# Patient Record
Sex: Male | Born: 1940 | Race: White | Hispanic: No | Marital: Married | Smoking: Former smoker
Health system: Southern US, Community
[De-identification: ages and names within clinical notes are randomized; demographics above are authoritative.]

## PROBLEM LIST (undated history)

## (undated) DIAGNOSIS — Z89519 Acquired absence of unspecified leg below knee: Secondary | ICD-10-CM

## (undated) DIAGNOSIS — F0390 Unspecified dementia without behavioral disturbance: Secondary | ICD-10-CM

## (undated) DIAGNOSIS — G629 Polyneuropathy, unspecified: Secondary | ICD-10-CM

## (undated) DIAGNOSIS — I4891 Unspecified atrial fibrillation: Secondary | ICD-10-CM

## (undated) DIAGNOSIS — I872 Venous insufficiency (chronic) (peripheral): Secondary | ICD-10-CM

## (undated) DIAGNOSIS — E876 Hypokalemia: Secondary | ICD-10-CM

## (undated) DIAGNOSIS — K7460 Unspecified cirrhosis of liver: Secondary | ICD-10-CM

## (undated) DIAGNOSIS — R609 Edema, unspecified: Secondary | ICD-10-CM

## (undated) HISTORY — DX: Polyneuropathy, unspecified: G62.9

## (undated) HISTORY — PX: HIP SURGERY: SHX245

## (undated) HISTORY — DX: Edema, unspecified: R60.9

## (undated) HISTORY — PX: EYE SURGERY: SHX253

## (undated) HISTORY — DX: Unspecified atrial fibrillation: I48.91

## (undated) HISTORY — PX: BELOW KNEE LEG AMPUTATION: SUR23

## (undated) HISTORY — DX: Hypokalemia: E87.6

## (undated) HISTORY — DX: Venous insufficiency (chronic) (peripheral): I87.2

## (undated) HISTORY — PX: TONSILLECTOMY AND ADENOIDECTOMY: SUR1326

## (undated) HISTORY — DX: Acquired absence of unspecified leg below knee: Z89.519

---

## 2007-08-07 DIAGNOSIS — Z89519 Acquired absence of unspecified leg below knee: Secondary | ICD-10-CM

## 2007-08-07 HISTORY — DX: Acquired absence of unspecified leg below knee: Z89.519

## 2010-09-20 ENCOUNTER — Ambulatory Visit: Payer: Medicare Other | Attending: Vascular Surgery | Admitting: Physical Therapy

## 2010-09-20 DIAGNOSIS — S88119A Complete traumatic amputation at level between knee and ankle, unspecified lower leg, initial encounter: Secondary | ICD-10-CM | POA: Insufficient documentation

## 2010-09-20 DIAGNOSIS — IMO0001 Reserved for inherently not codable concepts without codable children: Secondary | ICD-10-CM | POA: Insufficient documentation

## 2010-09-20 DIAGNOSIS — M6281 Muscle weakness (generalized): Secondary | ICD-10-CM | POA: Insufficient documentation

## 2010-09-20 DIAGNOSIS — R5381 Other malaise: Secondary | ICD-10-CM | POA: Insufficient documentation

## 2010-09-20 DIAGNOSIS — R269 Unspecified abnormalities of gait and mobility: Secondary | ICD-10-CM | POA: Insufficient documentation

## 2010-09-27 ENCOUNTER — Ambulatory Visit: Payer: Medicare Other | Admitting: Physical Therapy

## 2010-09-28 ENCOUNTER — Ambulatory Visit: Payer: PRIVATE HEALTH INSURANCE | Admitting: Physical Therapy

## 2010-10-03 ENCOUNTER — Encounter: Payer: PRIVATE HEALTH INSURANCE | Admitting: Physical Therapy

## 2010-10-05 ENCOUNTER — Encounter: Payer: PRIVATE HEALTH INSURANCE | Admitting: Physical Therapy

## 2010-10-10 ENCOUNTER — Ambulatory Visit: Payer: Medicare Other | Attending: Internal Medicine | Admitting: Physical Therapy

## 2010-10-10 DIAGNOSIS — R5381 Other malaise: Secondary | ICD-10-CM | POA: Insufficient documentation

## 2010-10-10 DIAGNOSIS — IMO0001 Reserved for inherently not codable concepts without codable children: Secondary | ICD-10-CM | POA: Insufficient documentation

## 2010-10-10 DIAGNOSIS — S88119A Complete traumatic amputation at level between knee and ankle, unspecified lower leg, initial encounter: Secondary | ICD-10-CM | POA: Insufficient documentation

## 2010-10-10 DIAGNOSIS — R269 Unspecified abnormalities of gait and mobility: Secondary | ICD-10-CM | POA: Insufficient documentation

## 2010-10-10 DIAGNOSIS — M6281 Muscle weakness (generalized): Secondary | ICD-10-CM | POA: Insufficient documentation

## 2010-10-12 ENCOUNTER — Ambulatory Visit: Payer: Medicare Other | Admitting: Physical Therapy

## 2010-10-17 ENCOUNTER — Encounter: Payer: PRIVATE HEALTH INSURANCE | Admitting: Physical Therapy

## 2010-10-19 ENCOUNTER — Ambulatory Visit: Payer: Medicare Other | Admitting: Physical Therapy

## 2010-10-23 ENCOUNTER — Ambulatory Visit: Payer: Medicare Other | Admitting: Physical Therapy

## 2010-10-24 ENCOUNTER — Encounter: Payer: PRIVATE HEALTH INSURANCE | Admitting: Physical Therapy

## 2010-10-26 ENCOUNTER — Ambulatory Visit: Payer: Medicare Other | Admitting: Physical Therapy

## 2010-10-31 ENCOUNTER — Ambulatory Visit: Payer: Medicare Other | Admitting: Physical Therapy

## 2010-11-02 ENCOUNTER — Ambulatory Visit: Payer: Medicare Other | Admitting: Physical Therapy

## 2010-11-07 ENCOUNTER — Ambulatory Visit: Payer: Medicare Other | Attending: Internal Medicine | Admitting: Physical Therapy

## 2010-11-07 DIAGNOSIS — R5381 Other malaise: Secondary | ICD-10-CM | POA: Insufficient documentation

## 2010-11-07 DIAGNOSIS — IMO0001 Reserved for inherently not codable concepts without codable children: Secondary | ICD-10-CM | POA: Insufficient documentation

## 2010-11-07 DIAGNOSIS — M6281 Muscle weakness (generalized): Secondary | ICD-10-CM | POA: Insufficient documentation

## 2010-11-07 DIAGNOSIS — R269 Unspecified abnormalities of gait and mobility: Secondary | ICD-10-CM | POA: Insufficient documentation

## 2010-11-07 DIAGNOSIS — S88119A Complete traumatic amputation at level between knee and ankle, unspecified lower leg, initial encounter: Secondary | ICD-10-CM | POA: Insufficient documentation

## 2010-11-09 ENCOUNTER — Encounter: Payer: PRIVATE HEALTH INSURANCE | Admitting: Physical Therapy

## 2010-11-14 ENCOUNTER — Ambulatory Visit: Payer: PRIVATE HEALTH INSURANCE | Admitting: Physical Therapy

## 2010-11-16 ENCOUNTER — Ambulatory Visit: Payer: Medicare Other | Admitting: Physical Therapy

## 2010-11-20 ENCOUNTER — Ambulatory Visit: Payer: Medicare Other | Admitting: Physical Therapy

## 2010-11-21 ENCOUNTER — Encounter: Payer: PRIVATE HEALTH INSURANCE | Admitting: Physical Therapy

## 2010-11-23 ENCOUNTER — Ambulatory Visit: Payer: Medicare Other | Admitting: Physical Therapy

## 2010-11-27 ENCOUNTER — Ambulatory Visit: Payer: Medicare Other | Admitting: Physical Therapy

## 2010-11-30 ENCOUNTER — Ambulatory Visit: Payer: Medicare Other | Admitting: Physical Therapy

## 2010-12-04 ENCOUNTER — Ambulatory Visit: Payer: Medicare Other | Admitting: Physical Therapy

## 2010-12-07 ENCOUNTER — Ambulatory Visit: Payer: Medicare Other | Attending: Internal Medicine | Admitting: Physical Therapy

## 2010-12-07 DIAGNOSIS — R5381 Other malaise: Secondary | ICD-10-CM | POA: Insufficient documentation

## 2010-12-07 DIAGNOSIS — M6281 Muscle weakness (generalized): Secondary | ICD-10-CM | POA: Insufficient documentation

## 2010-12-07 DIAGNOSIS — S88119A Complete traumatic amputation at level between knee and ankle, unspecified lower leg, initial encounter: Secondary | ICD-10-CM | POA: Insufficient documentation

## 2010-12-07 DIAGNOSIS — R269 Unspecified abnormalities of gait and mobility: Secondary | ICD-10-CM | POA: Insufficient documentation

## 2010-12-07 DIAGNOSIS — IMO0001 Reserved for inherently not codable concepts without codable children: Secondary | ICD-10-CM | POA: Insufficient documentation

## 2010-12-11 ENCOUNTER — Ambulatory Visit: Payer: Medicare Other | Admitting: Physical Therapy

## 2010-12-11 ENCOUNTER — Encounter: Payer: PRIVATE HEALTH INSURANCE | Admitting: Physical Therapy

## 2010-12-14 ENCOUNTER — Encounter: Payer: PRIVATE HEALTH INSURANCE | Admitting: Physical Therapy

## 2010-12-18 ENCOUNTER — Ambulatory Visit: Payer: Medicare Other | Admitting: Physical Therapy

## 2010-12-21 ENCOUNTER — Encounter: Payer: Self-pay | Admitting: Family Medicine

## 2010-12-21 ENCOUNTER — Ambulatory Visit: Payer: Medicare Other | Admitting: Physical Therapy

## 2010-12-26 ENCOUNTER — Ambulatory Visit: Payer: Medicare Other | Admitting: Physical Therapy

## 2010-12-28 ENCOUNTER — Ambulatory Visit: Payer: Medicare Other | Admitting: Physical Therapy

## 2011-01-02 ENCOUNTER — Ambulatory Visit: Payer: Medicare Other | Admitting: Physical Therapy

## 2011-01-04 ENCOUNTER — Ambulatory Visit: Payer: Medicare Other | Admitting: Physical Therapy

## 2011-01-08 ENCOUNTER — Encounter: Payer: PRIVATE HEALTH INSURANCE | Admitting: Physical Therapy

## 2011-01-09 ENCOUNTER — Ambulatory Visit: Payer: Medicare Other | Attending: Internal Medicine | Admitting: Physical Therapy

## 2011-01-09 DIAGNOSIS — R5381 Other malaise: Secondary | ICD-10-CM | POA: Insufficient documentation

## 2011-01-09 DIAGNOSIS — M6281 Muscle weakness (generalized): Secondary | ICD-10-CM | POA: Insufficient documentation

## 2011-01-09 DIAGNOSIS — S88119A Complete traumatic amputation at level between knee and ankle, unspecified lower leg, initial encounter: Secondary | ICD-10-CM | POA: Insufficient documentation

## 2011-01-09 DIAGNOSIS — R269 Unspecified abnormalities of gait and mobility: Secondary | ICD-10-CM | POA: Insufficient documentation

## 2011-01-09 DIAGNOSIS — IMO0001 Reserved for inherently not codable concepts without codable children: Secondary | ICD-10-CM | POA: Insufficient documentation

## 2011-01-11 ENCOUNTER — Ambulatory Visit: Payer: Medicare Other | Admitting: Physical Therapy

## 2011-07-11 DIAGNOSIS — I83009 Varicose veins of unspecified lower extremity with ulcer of unspecified site: Secondary | ICD-10-CM | POA: Insufficient documentation

## 2011-07-11 DIAGNOSIS — L97909 Non-pressure chronic ulcer of unspecified part of unspecified lower leg with unspecified severity: Secondary | ICD-10-CM | POA: Insufficient documentation

## 2011-08-09 DIAGNOSIS — L97209 Non-pressure chronic ulcer of unspecified calf with unspecified severity: Secondary | ICD-10-CM | POA: Insufficient documentation

## 2012-11-06 ENCOUNTER — Ambulatory Visit (INDEPENDENT_AMBULATORY_CARE_PROVIDER_SITE_OTHER): Payer: Medicare Other | Admitting: Pharmacist

## 2012-11-06 DIAGNOSIS — I482 Chronic atrial fibrillation, unspecified: Secondary | ICD-10-CM | POA: Insufficient documentation

## 2012-11-06 DIAGNOSIS — I4891 Unspecified atrial fibrillation: Secondary | ICD-10-CM

## 2012-11-06 LAB — POCT INR: INR: 2.3

## 2012-11-06 NOTE — Patient Instructions (Signed)
Anticoagulation Dose Instructions as of 11/06/2012     Brett Pearson Tue Wed Thu Fri Sat   New Dose 4 mg 6 mg 4 mg 6 mg 6 mg 6 mg 6 mg    Description       Continue 6mg  daily except 4mg  Sunday and Tuesday. Recheck protime in 6 weeks

## 2012-12-24 ENCOUNTER — Ambulatory Visit (INDEPENDENT_AMBULATORY_CARE_PROVIDER_SITE_OTHER): Payer: Medicare Other | Admitting: Pharmacist

## 2012-12-24 DIAGNOSIS — I4891 Unspecified atrial fibrillation: Secondary | ICD-10-CM

## 2012-12-24 NOTE — Progress Notes (Signed)
ERR - pt did not show

## 2013-01-12 ENCOUNTER — Ambulatory Visit (INDEPENDENT_AMBULATORY_CARE_PROVIDER_SITE_OTHER): Payer: Medicare Other | Admitting: Pharmacist

## 2013-01-12 DIAGNOSIS — I4891 Unspecified atrial fibrillation: Secondary | ICD-10-CM

## 2013-01-12 LAB — POCT INR: INR: 2.5

## 2013-01-12 NOTE — Patient Instructions (Signed)
Anticoagulation Dose Instructions as of 01/12/2013     Brett Pearson Tue Wed Thu Fri Sat   New Dose 4 mg 6 mg 4 mg 6 mg 6 mg 6 mg 6 mg    Description       Continue 6mg  daily except 4mg  Sunday and Tuesday. Recheck protime in 6 weeks       INR was 2.5 today

## 2013-01-15 ENCOUNTER — Other Ambulatory Visit: Payer: Self-pay | Admitting: *Deleted

## 2013-01-15 MED ORDER — WARFARIN SODIUM 2 MG PO TABS: 2.0000 mg | ORAL_TABLET | ORAL | Status: DC

## 2013-02-19 ENCOUNTER — Other Ambulatory Visit: Payer: Self-pay | Admitting: Family Medicine

## 2013-02-23 ENCOUNTER — Ambulatory Visit (INDEPENDENT_AMBULATORY_CARE_PROVIDER_SITE_OTHER): Payer: Medicare Other | Admitting: Pharmacist

## 2013-02-23 DIAGNOSIS — I4891 Unspecified atrial fibrillation: Secondary | ICD-10-CM

## 2013-02-23 NOTE — Patient Instructions (Signed)
Anticoagulation Dose Instructions as of 02/23/2013     Brett Pearson Tue Wed Thu Fri Sat   New Dose 4 mg 6 mg 4 mg 6 mg 6 mg 6 mg 6 mg    Description       Continue 6mg  daily except 4mg  Sunday and Tuesday. Recheck protime in 6 weeks      INR was 2.3 today

## 2013-02-23 NOTE — Addendum Note (Signed)
Addended by: Henrene Pastor on: 02/23/2013 11:27 AM   Modules accepted: Orders

## 2013-03-30 ENCOUNTER — Other Ambulatory Visit: Payer: Self-pay | Admitting: Nurse Practitioner

## 2013-03-31 NOTE — Telephone Encounter (Signed)
Last PT INR 02/04/13

## 2013-04-09 ENCOUNTER — Ambulatory Visit (INDEPENDENT_AMBULATORY_CARE_PROVIDER_SITE_OTHER): Payer: Medicare Other | Admitting: Pharmacist

## 2013-04-09 DIAGNOSIS — I4891 Unspecified atrial fibrillation: Secondary | ICD-10-CM

## 2013-04-09 LAB — POCT INR: INR: 2.1

## 2013-04-09 NOTE — Patient Instructions (Addendum)
Anticoagulation Dose Instructions as of 04/09/2013     Glynis Smiles Tue Wed Thu Fri Sat   New Dose 4 mg 6 mg 4 mg 6 mg 6 mg 6 mg 6 mg    Description       Continue 6mg  daily except 4mg  Sunday and Tuesday. Recheck protime in 6 weeks      INR was 2.1 today

## 2013-05-09 ENCOUNTER — Other Ambulatory Visit: Payer: Self-pay | Admitting: Nurse Practitioner

## 2013-05-11 NOTE — Telephone Encounter (Signed)
Last INR 04/09/13

## 2013-05-20 ENCOUNTER — Other Ambulatory Visit: Payer: Self-pay | Admitting: *Deleted

## 2013-05-20 MED ORDER — HYDROCODONE-ACETAMINOPHEN 7.5-325 MG PO TABS: 1.0000 | ORAL_TABLET | Freq: Four times a day (QID) | ORAL | Status: DC | PRN

## 2013-06-15 ENCOUNTER — Ambulatory Visit (INDEPENDENT_AMBULATORY_CARE_PROVIDER_SITE_OTHER): Payer: Medicare Other | Admitting: Pharmacist

## 2013-06-15 DIAGNOSIS — I4891 Unspecified atrial fibrillation: Secondary | ICD-10-CM

## 2013-06-15 LAB — POCT INR: INR: 2.3

## 2013-06-15 NOTE — Patient Instructions (Signed)
Anticoagulation Dose Instructions as of 06/15/2013     Brett Pearson Tue Wed Thu Fri Sat   New Dose 4 mg 6 mg 4 mg 6 mg 6 mg 6 mg 6 mg    Description       Continue 6mg  daily except 4mg  Sunday and Tuesday. Recheck protime in 6 weeks      INR was 2.3 today

## 2013-06-23 ENCOUNTER — Ambulatory Visit: Payer: Medicare Other | Attending: Sports Medicine | Admitting: Physical Therapy

## 2013-06-23 DIAGNOSIS — R5381 Other malaise: Secondary | ICD-10-CM | POA: Insufficient documentation

## 2013-06-23 DIAGNOSIS — R269 Unspecified abnormalities of gait and mobility: Secondary | ICD-10-CM | POA: Insufficient documentation

## 2013-06-23 DIAGNOSIS — IMO0001 Reserved for inherently not codable concepts without codable children: Secondary | ICD-10-CM | POA: Insufficient documentation

## 2013-06-23 DIAGNOSIS — R293 Abnormal posture: Secondary | ICD-10-CM | POA: Insufficient documentation

## 2013-06-23 DIAGNOSIS — M6281 Muscle weakness (generalized): Secondary | ICD-10-CM | POA: Insufficient documentation

## 2013-06-25 ENCOUNTER — Ambulatory Visit: Payer: Medicare Other | Admitting: Physical Therapy

## 2013-06-30 ENCOUNTER — Ambulatory Visit: Payer: Medicare Other | Admitting: Physical Therapy

## 2013-06-30 ENCOUNTER — Other Ambulatory Visit: Payer: Self-pay | Admitting: Family Medicine

## 2013-07-01 ENCOUNTER — Ambulatory Visit: Payer: Medicare Other | Admitting: Physical Therapy

## 2013-07-06 ENCOUNTER — Ambulatory Visit: Payer: Medicare Other | Attending: Sports Medicine | Admitting: Physical Therapy

## 2013-07-06 DIAGNOSIS — R269 Unspecified abnormalities of gait and mobility: Secondary | ICD-10-CM | POA: Insufficient documentation

## 2013-07-06 DIAGNOSIS — R5381 Other malaise: Secondary | ICD-10-CM | POA: Insufficient documentation

## 2013-07-06 DIAGNOSIS — IMO0001 Reserved for inherently not codable concepts without codable children: Secondary | ICD-10-CM | POA: Insufficient documentation

## 2013-07-06 DIAGNOSIS — R293 Abnormal posture: Secondary | ICD-10-CM | POA: Insufficient documentation

## 2013-07-06 DIAGNOSIS — M6281 Muscle weakness (generalized): Secondary | ICD-10-CM | POA: Insufficient documentation

## 2013-07-08 ENCOUNTER — Ambulatory Visit: Payer: Medicare Other | Admitting: Physical Therapy

## 2013-07-13 ENCOUNTER — Ambulatory Visit: Payer: Medicare Other | Admitting: Physical Therapy

## 2013-07-15 ENCOUNTER — Ambulatory Visit: Payer: Medicare Other | Admitting: Physical Therapy

## 2013-07-20 ENCOUNTER — Ambulatory Visit: Payer: Medicare Other | Admitting: Physical Therapy

## 2013-07-22 ENCOUNTER — Ambulatory Visit: Payer: Medicare Other | Admitting: Physical Therapy

## 2013-07-28 ENCOUNTER — Ambulatory Visit: Payer: Medicare Other | Admitting: Physical Therapy

## 2013-07-29 ENCOUNTER — Ambulatory Visit: Payer: Medicare Other | Admitting: Physical Therapy

## 2013-08-04 ENCOUNTER — Ambulatory Visit: Payer: Medicare Other | Admitting: Physical Therapy

## 2013-08-07 ENCOUNTER — Ambulatory Visit: Payer: Medicare Other | Admitting: Physical Therapy

## 2013-08-10 ENCOUNTER — Ambulatory Visit (INDEPENDENT_AMBULATORY_CARE_PROVIDER_SITE_OTHER): Payer: Medicare Other | Admitting: Pharmacist

## 2013-08-10 DIAGNOSIS — I4891 Unspecified atrial fibrillation: Secondary | ICD-10-CM

## 2013-08-10 LAB — POCT INR: INR: 2.2

## 2013-08-10 NOTE — Patient Instructions (Signed)
Anticoagulation Dose Instructions as of 08/10/2013     Dorene Grebe Tue Wed Thu Fri Sat   New Dose 4 mg 6 mg 4 mg 6 mg 6 mg 6 mg 6 mg    Description       Continue 6mg  daily except 4mg  Sunday and Tuesday. Recheck protime in 6 weeks      INR was 2.2 today

## 2013-08-11 ENCOUNTER — Ambulatory Visit: Payer: Medicare Other | Admitting: Physical Therapy

## 2013-08-13 ENCOUNTER — Ambulatory Visit: Payer: Medicare Other | Admitting: Physical Therapy

## 2013-08-18 ENCOUNTER — Ambulatory Visit: Payer: Medicare Other | Admitting: Physical Therapy

## 2013-08-20 ENCOUNTER — Ambulatory Visit: Payer: Medicare Other | Admitting: Physical Therapy

## 2013-09-21 ENCOUNTER — Ambulatory Visit (INDEPENDENT_AMBULATORY_CARE_PROVIDER_SITE_OTHER): Payer: Medicare Other | Admitting: Pharmacist

## 2013-09-21 DIAGNOSIS — I4891 Unspecified atrial fibrillation: Secondary | ICD-10-CM

## 2013-09-21 LAB — POCT INR: INR: 2.2

## 2013-09-21 NOTE — Patient Instructions (Signed)
Anticoagulation Dose Instructions as of 09/21/2013     Dorene Grebe Tue Wed Thu Fri Sat   New Dose 4 mg 6 mg 4 mg 6 mg 6 mg 6 mg 6 mg    Description       Continue 6mg  daily except 4mg  Sunday and Tuesday. Recheck protime in 6 weeks      INR was 2.2 today

## 2013-09-25 ENCOUNTER — Other Ambulatory Visit: Payer: Self-pay | Admitting: *Deleted

## 2013-09-25 MED ORDER — WARFARIN SODIUM 2 MG PO TABS: ORAL_TABLET | ORAL | Status: DC

## 2013-11-02 ENCOUNTER — Ambulatory Visit (INDEPENDENT_AMBULATORY_CARE_PROVIDER_SITE_OTHER): Payer: Medicare Other | Admitting: Pharmacist

## 2013-11-02 DIAGNOSIS — I4891 Unspecified atrial fibrillation: Secondary | ICD-10-CM

## 2013-11-02 LAB — POCT INR: INR: 1.9

## 2013-11-02 NOTE — Patient Instructions (Signed)
Anticoagulation Dose Instructions as of 11/02/2013     Brett Pearson Tue Wed Thu Fri Sat   New Dose 4 mg 6 mg 4 mg 6 mg 6 mg 6 mg 6 mg    Description       Take 3 tablets (= 6mg ) for the next 2 days and then resume current dose of  daily except 4mg  Sunday and Tuesday.       INR was 1.9 today

## 2013-11-05 ENCOUNTER — Other Ambulatory Visit: Payer: Self-pay | Admitting: *Deleted

## 2013-11-05 MED ORDER — WARFARIN SODIUM 2 MG PO TABS: 4.0000 mg | ORAL_TABLET | Freq: Every day | ORAL | Status: DC

## 2013-12-14 ENCOUNTER — Ambulatory Visit (INDEPENDENT_AMBULATORY_CARE_PROVIDER_SITE_OTHER): Payer: Medicare Other | Admitting: Pharmacist

## 2013-12-14 DIAGNOSIS — I4891 Unspecified atrial fibrillation: Secondary | ICD-10-CM

## 2013-12-14 LAB — POCT INR: INR: 2.4

## 2013-12-14 NOTE — Patient Instructions (Signed)
Anticoagulation Dose Instructions as of 12/14/2013     Dorene Grebe Tue Wed Thu Fri Sat   New Dose 4 mg 6 mg 4 mg 6 mg 6 mg 6 mg 6 mg    Description       Continue current dose of  daily except 4mg  Sunday and Tuesday.       INR was 2.4 today

## 2014-01-01 ENCOUNTER — Other Ambulatory Visit: Payer: Self-pay | Admitting: Family Medicine

## 2014-01-05 ENCOUNTER — Other Ambulatory Visit: Payer: Self-pay | Admitting: Family Medicine

## 2014-01-06 NOTE — Telephone Encounter (Signed)
Last INR 12/14/13 T Eckard

## 2014-01-26 ENCOUNTER — Ambulatory Visit (INDEPENDENT_AMBULATORY_CARE_PROVIDER_SITE_OTHER): Payer: Medicare Other | Admitting: Pharmacist Clinician (PhC)/ Clinical Pharmacy Specialist

## 2014-01-26 DIAGNOSIS — I4891 Unspecified atrial fibrillation: Secondary | ICD-10-CM

## 2014-01-26 LAB — POCT INR: INR: 2.2

## 2014-03-04 ENCOUNTER — Other Ambulatory Visit: Payer: Self-pay | Admitting: Family Medicine

## 2014-03-11 ENCOUNTER — Ambulatory Visit (INDEPENDENT_AMBULATORY_CARE_PROVIDER_SITE_OTHER): Payer: Medicare Other | Admitting: Pharmacist

## 2014-03-11 DIAGNOSIS — I4891 Unspecified atrial fibrillation: Secondary | ICD-10-CM

## 2014-03-11 LAB — POCT INR: INR: 2.2

## 2014-03-11 MED ORDER — WARFARIN SODIUM 2 MG PO TABS: ORAL_TABLET | ORAL | Status: DC

## 2014-03-11 NOTE — Patient Instructions (Signed)
Anticoagulation Dose Instructions as of 03/11/2014     Brett Pearson Tue Wed Thu Fri Sat   New Dose 4 mg 6 mg 4 mg 6 mg 6 mg 6 mg 6 mg    Description       Continue current warfarin dose of 6mg  3 tablets daily except 4mg  Sunday and Tuesday.      INR was 2.2 today

## 2014-06-14 ENCOUNTER — Ambulatory Visit (INDEPENDENT_AMBULATORY_CARE_PROVIDER_SITE_OTHER): Payer: Medicare Other | Admitting: Pharmacist

## 2014-06-14 ENCOUNTER — Encounter: Payer: Self-pay | Admitting: Pharmacist

## 2014-06-14 DIAGNOSIS — I4891 Unspecified atrial fibrillation: Secondary | ICD-10-CM

## 2014-06-14 LAB — POCT INR: INR: 2.4

## 2014-06-14 NOTE — Patient Instructions (Signed)
Anticoagulation Dose Instructions as of 06/14/2014      Brett Pearson Tue Wed Thu Fri Sat   New Dose 4 mg 6 mg 4 mg 6 mg 6 mg 6 mg 6 mg    Description        Continue current warfarin dose of 6mg   = 3 tablets daily except 4mg  = 2 tablets on Sunday and Tuesday.       INR was 2.4 today

## 2014-07-26 ENCOUNTER — Ambulatory Visit (INDEPENDENT_AMBULATORY_CARE_PROVIDER_SITE_OTHER): Payer: Medicare Other | Admitting: Pharmacist

## 2014-07-26 DIAGNOSIS — I4891 Unspecified atrial fibrillation: Secondary | ICD-10-CM

## 2014-07-26 LAB — POCT INR: INR: 2.5

## 2014-07-26 NOTE — Patient Instructions (Signed)
Anticoagulation Dose Instructions as of 07/26/2014      Dorene Grebe Tue Wed Thu Fri Sat   New Dose 4 mg 6 mg 4 mg 6 mg 6 mg 6 mg 6 mg    Description        Continue current warfarin dose of 6mg   = 3 tablets daily except 4mg  = 2 tablets on Sunday and Tuesday.       INR was 2.5 today

## 2014-09-06 ENCOUNTER — Ambulatory Visit (INDEPENDENT_AMBULATORY_CARE_PROVIDER_SITE_OTHER): Payer: Medicare Other | Admitting: Pharmacist

## 2014-09-06 DIAGNOSIS — I4891 Unspecified atrial fibrillation: Secondary | ICD-10-CM

## 2014-09-06 LAB — POCT INR: INR: 3

## 2014-09-06 NOTE — Patient Instructions (Signed)
Anticoagulation Dose Instructions as of 09/06/2014      Brett Pearson Tue Wed Thu Fri Sat   New Dose 4 mg 6 mg 4 mg 6 mg 6 mg 6 mg 6 mg    Description        Continue current warfarin dose of 6mg   = 3 tablets daily except 4mg  = 2 tablets on Sunday and Tuesday.       INR was 3.0 today

## 2014-09-17 ENCOUNTER — Other Ambulatory Visit: Payer: Self-pay | Admitting: Pharmacist

## 2014-09-17 MED ORDER — WARFARIN SODIUM 2 MG PO TABS: ORAL_TABLET | ORAL | Status: DC

## 2014-09-29 ENCOUNTER — Telehealth: Payer: Self-pay | Admitting: Family Medicine

## 2014-09-29 NOTE — Telephone Encounter (Signed)
Ok to hold warfarin starting 2 days prior to procedure.  Restart the evening of procedure as long as not bleeding. Rx with [facility] faxed to Dr Bobby Rumpf' office. confirmed receipt with [facility].   Called patient with instructions.

## 2014-10-14 ENCOUNTER — Ambulatory Visit (INDEPENDENT_AMBULATORY_CARE_PROVIDER_SITE_OTHER): Payer: Medicare Other | Admitting: Pharmacist

## 2014-10-14 DIAGNOSIS — I4891 Unspecified atrial fibrillation: Secondary | ICD-10-CM | POA: Diagnosis not present

## 2014-10-14 LAB — POCT INR: INR: 1.8

## 2014-10-14 NOTE — Patient Instructions (Signed)
Anticoagulation Dose Instructions as of 10/14/2014      Brett Pearson Tue Wed Thu Fri Sat   New Dose 4 mg 6 mg 4 mg 6 mg 6 mg 6 mg 6 mg    Description        Take 4 tablets today - Thursday, March 10th, then continue current warfarin dose of 6mg   = 3 tablets daily except 4mg  = 2 tablets on Sunday and Tuesday.       INR was 1.8 today

## 2014-11-18 ENCOUNTER — Ambulatory Visit (INDEPENDENT_AMBULATORY_CARE_PROVIDER_SITE_OTHER): Payer: Medicare Other | Admitting: Pharmacist

## 2014-11-18 DIAGNOSIS — I4891 Unspecified atrial fibrillation: Secondary | ICD-10-CM

## 2014-11-18 LAB — POCT INR: INR: 3.1

## 2014-11-18 NOTE — Patient Instructions (Signed)
Anticoagulation Dose Instructions as of 11/18/2014      Brett Pearson Tue Wed Thu Fri Sat   New Dose 4 mg 6 mg 4 mg 6 mg 6 mg 6 mg 6 mg    Description        No warfarin today then continue current warfarin dose of 6mg   = 3 tablets daily except 4mg  = 2 tablets on Sunday and Tuesday.      INR was 3.1 today

## 2014-12-23 ENCOUNTER — Encounter (INDEPENDENT_AMBULATORY_CARE_PROVIDER_SITE_OTHER): Payer: Self-pay

## 2014-12-23 ENCOUNTER — Ambulatory Visit (INDEPENDENT_AMBULATORY_CARE_PROVIDER_SITE_OTHER): Payer: Medicare Other | Admitting: Pharmacist

## 2014-12-23 DIAGNOSIS — I4891 Unspecified atrial fibrillation: Secondary | ICD-10-CM

## 2014-12-23 LAB — POCT INR: INR: 3.3

## 2014-12-23 MED ORDER — WARFARIN SODIUM 2 MG PO TABS: ORAL_TABLET | ORAL | Status: DC

## 2014-12-23 NOTE — Patient Instructions (Signed)
Anticoagulation Dose Instructions as of 12/23/2014      Brett Pearson Tue Wed Thu Fri Sat   New Dose 4 mg 6 mg 4 mg 6 mg 4 mg 6 mg 6 mg    Description        No warfarin today - Thursday, May 19th then decrease warfarin dose to 6mg   = 3 tablets daily except 4mg  = 2 tablets on Sunday, Tuesday and Thursday.      INR was 3.3 today

## 2015-01-10 ENCOUNTER — Telehealth: Payer: Self-pay | Admitting: Pharmacist

## 2015-01-10 NOTE — Telephone Encounter (Signed)
Patient having spot removed from ear.  Probably does not have to hold warfarin but I instructed patient to not take warfarin on Tuesday, June 7th and Wednesday, June 8th.  Procedure is scheduled for Thursday, June 9th.  He can restart warfarin the evening after procedure as long as no bleeding.  Restart at regular dose.  I also notified Dr Chip Boer office who is performing procedure.

## 2015-02-03 ENCOUNTER — Encounter (INDEPENDENT_AMBULATORY_CARE_PROVIDER_SITE_OTHER): Payer: Self-pay

## 2015-02-03 ENCOUNTER — Ambulatory Visit (INDEPENDENT_AMBULATORY_CARE_PROVIDER_SITE_OTHER): Payer: Medicare Other | Admitting: Pharmacist

## 2015-02-03 DIAGNOSIS — I4891 Unspecified atrial fibrillation: Secondary | ICD-10-CM

## 2015-02-03 LAB — POCT INR: INR: 2.1

## 2015-02-03 NOTE — Patient Instructions (Signed)
Anticoagulation Dose Instructions as of 02/03/2015      Dorene Grebe Tue Wed Thu Fri Sat   New Dose 4 mg 6 mg 4 mg 6 mg 4 mg 6 mg 6 mg    Description        warfarin dose to 6mg   = 3 tablets daily except 4mg  = 2 tablets on Sunday, Tuesday and Thursday.      INR was 2.1 today.

## 2015-03-17 ENCOUNTER — Ambulatory Visit (INDEPENDENT_AMBULATORY_CARE_PROVIDER_SITE_OTHER): Payer: Medicare Other | Admitting: Pharmacist

## 2015-03-17 ENCOUNTER — Encounter: Payer: Self-pay | Admitting: Pharmacist

## 2015-03-17 ENCOUNTER — Encounter (INDEPENDENT_AMBULATORY_CARE_PROVIDER_SITE_OTHER): Payer: Self-pay

## 2015-03-17 VITALS — BP 110/70 | HR 67 | Ht 73.0 in | Wt 259.0 lb

## 2015-03-17 DIAGNOSIS — I4891 Unspecified atrial fibrillation: Secondary | ICD-10-CM | POA: Diagnosis not present

## 2015-03-17 LAB — POCT INR: INR: 2.4

## 2015-03-17 NOTE — Patient Instructions (Signed)
Anticoagulation Dose Instructions as of 03/17/2015      Brett Pearson Tue Wed Thu Fri Sat   New Dose 4 mg 6 mg 4 mg 6 mg 4 mg 6 mg 6 mg    Description        warfarin dose to 6mg   = 3 tablets daily except 4mg  = 2 tablets on Sunday, Tuesday and Thursday.      INR was 2.4 today

## 2015-04-27 ENCOUNTER — Encounter: Payer: Self-pay | Admitting: Pharmacist

## 2015-04-27 ENCOUNTER — Ambulatory Visit (INDEPENDENT_AMBULATORY_CARE_PROVIDER_SITE_OTHER): Payer: Medicare Other | Admitting: Pharmacist

## 2015-04-27 DIAGNOSIS — I4891 Unspecified atrial fibrillation: Secondary | ICD-10-CM

## 2015-04-27 LAB — POCT INR: INR: 2.4

## 2015-04-27 NOTE — Progress Notes (Signed)
See anticoagulation notes.

## 2015-04-27 NOTE — Patient Instructions (Signed)
Anticoagulation Dose Instructions as of 04/27/2015      Dorene Grebe Tue Wed Thu Fri Sat   New Dose 4 mg 6 mg 4 mg 6 mg 4 mg 6 mg 6 mg    Description        Continue current warfarin dose of 6mg   = 3 tablets daily except 4mg  = 2 tablets on Sunday, Tuesday and Thursday.      INR was 2.4 today

## 2015-06-08 ENCOUNTER — Ambulatory Visit (INDEPENDENT_AMBULATORY_CARE_PROVIDER_SITE_OTHER): Payer: Medicare Other | Admitting: Pharmacist

## 2015-06-08 DIAGNOSIS — I4891 Unspecified atrial fibrillation: Secondary | ICD-10-CM

## 2015-06-08 LAB — POCT INR: INR: 3

## 2015-06-08 NOTE — Patient Instructions (Signed)
Anticoagulation Dose Instructions as of 06/08/2015      Brett Pearson Tue Wed Thu Fri Sat   New Dose 4 mg 6 mg 4 mg 6 mg 4 mg 6 mg 6 mg    Description        Continue current warfarin dose of 6mg   = 3 tablets daily except 4mg  = 2 tablets on Sunday, Tuesday and Thursday.

## 2015-07-07 ENCOUNTER — Ambulatory Visit (INDEPENDENT_AMBULATORY_CARE_PROVIDER_SITE_OTHER): Payer: Medicare Other | Admitting: Pharmacist

## 2015-07-07 DIAGNOSIS — I4891 Unspecified atrial fibrillation: Secondary | ICD-10-CM

## 2015-07-07 LAB — POCT INR: INR: 3

## 2015-07-07 NOTE — Patient Instructions (Signed)
Anticoagulation Dose Instructions as of 07/07/2015      Dorene Grebe Tue Wed Thu Fri Sat   New Dose 4 mg 6 mg 4 mg 6 mg 4 mg 6 mg 6 mg    Description        Continue current warfarin dose of 6mg   = 3 tablets daily except 4mg  = 2 tablets on Sunday, Tuesday and Thursday.      INR was 3.0 today

## 2015-07-11 ENCOUNTER — Telehealth: Payer: Self-pay | Admitting: Family Medicine

## 2015-07-11 NOTE — Telephone Encounter (Signed)
Called patient - he is getting 1 tooth pulled on Thursday, December 8th.   Not necessary to hold prior to tooth extraction but Dr Bobby Rumpf usually likes warfarin to be held.   Advised patient to hold warfarin on Wednesday prior to extraction.  May restart on Thursday evening if no bleeding.  If he is having bleeding the restart warfarin on Friday 07/16/15. Discussed with Dr Laurance Flatten and note faxed to Dr Bobby Rumpf at [phone]

## 2015-07-13 DIAGNOSIS — I10 Essential (primary) hypertension: Secondary | ICD-10-CM | POA: Insufficient documentation

## 2015-07-13 DIAGNOSIS — E669 Obesity, unspecified: Secondary | ICD-10-CM | POA: Insufficient documentation

## 2015-07-27 DIAGNOSIS — Z89511 Acquired absence of right leg below knee: Secondary | ICD-10-CM | POA: Insufficient documentation

## 2015-08-18 ENCOUNTER — Ambulatory Visit (INDEPENDENT_AMBULATORY_CARE_PROVIDER_SITE_OTHER): Payer: Medicare Other | Admitting: Pharmacist

## 2015-08-18 DIAGNOSIS — I4891 Unspecified atrial fibrillation: Secondary | ICD-10-CM

## 2015-08-18 LAB — POCT INR: INR: 2.4

## 2015-08-18 NOTE — Patient Instructions (Signed)
Anticoagulation Dose Instructions as of 08/18/2015      Dorene Grebe Tue Wed Thu Fri Sat   New Dose 4 mg 6 mg 4 mg 6 mg 4 mg 6 mg 6 mg    Description        Continue current warfarin dose of 6mg   = 3 tablets daily except 4mg  = 2 tablets on Sunday, Tuesday and Thursday.      INR was 2.4 today

## 2015-09-23 ENCOUNTER — Other Ambulatory Visit: Payer: Self-pay | Admitting: Pharmacist

## 2015-09-29 ENCOUNTER — Ambulatory Visit (INDEPENDENT_AMBULATORY_CARE_PROVIDER_SITE_OTHER): Payer: Medicare Other | Admitting: Pharmacist

## 2015-09-29 DIAGNOSIS — I4891 Unspecified atrial fibrillation: Secondary | ICD-10-CM | POA: Diagnosis not present

## 2015-09-29 LAB — POCT INR: INR: 2.5

## 2015-09-29 NOTE — Patient Instructions (Signed)
Anticoagulation Dose Instructions as of 09/29/2015      Brett Pearson Tue Wed Thu Fri Sat   New Dose 4 mg 6 mg 4 mg 6 mg 4 mg 6 mg 6 mg    Description        Continue current warfarin dose of 6mg   = 3 tablets daily except 4mg  = 2 tablets on Sunday, Tuesday and Thursday.      INR was 2.5 today

## 2015-11-07 ENCOUNTER — Ambulatory Visit (INDEPENDENT_AMBULATORY_CARE_PROVIDER_SITE_OTHER): Payer: Medicare Other | Admitting: Pharmacist

## 2015-11-07 DIAGNOSIS — I4891 Unspecified atrial fibrillation: Secondary | ICD-10-CM

## 2015-11-07 LAB — COAGUCHEK XS/INR WAIVED
INR: 2.9 — AB (ref 0.9–1.1)
Prothrombin Time: 34.8 s

## 2015-11-07 NOTE — Patient Instructions (Signed)
Anticoagulation Dose Instructions as of 11/07/2015      Brett Pearson Tue Wed Thu Fri Sat   New Dose 4 mg 6 mg 4 mg 6 mg 4 mg 6 mg 6 mg    Description        Continue current warfarin dose of 6mg   = 3 tablets daily except 4mg  = 2 tablets on Sunday, Tuesday and Thursday.      INR was 2.9 today

## 2015-12-12 ENCOUNTER — Ambulatory Visit (INDEPENDENT_AMBULATORY_CARE_PROVIDER_SITE_OTHER): Payer: Medicare Other | Admitting: Pharmacist

## 2015-12-12 ENCOUNTER — Encounter (INDEPENDENT_AMBULATORY_CARE_PROVIDER_SITE_OTHER): Payer: Self-pay

## 2015-12-12 DIAGNOSIS — I4891 Unspecified atrial fibrillation: Secondary | ICD-10-CM | POA: Diagnosis not present

## 2015-12-12 LAB — COAGUCHEK XS/INR WAIVED
INR: 2.9 — AB (ref 0.9–1.1)
PROTHROMBIN TIME: 34.3 s

## 2015-12-12 NOTE — Patient Instructions (Signed)
Anticoagulation Dose Instructions as of 12/12/2015      Brett Pearson Tue Wed Thu Fri Sat   New Dose 4 mg 6 mg 4 mg 6 mg 4 mg 6 mg 6 mg    Description        Continue current warfarin dose of 6mg   = 3 tablets daily except 4mg  = 2 tablets on Sunday, Tuesday and Thursday.      INR was 2.9 today

## 2016-01-23 ENCOUNTER — Encounter: Payer: Self-pay | Admitting: Pharmacist

## 2016-01-24 ENCOUNTER — Ambulatory Visit (INDEPENDENT_AMBULATORY_CARE_PROVIDER_SITE_OTHER): Payer: Medicare Other | Admitting: Pharmacist

## 2016-01-24 DIAGNOSIS — I4891 Unspecified atrial fibrillation: Secondary | ICD-10-CM

## 2016-01-24 LAB — COAGUCHEK XS/INR WAIVED
INR: 2.4 — AB (ref 0.9–1.1)
Prothrombin Time: 28.3 s

## 2016-01-24 NOTE — Patient Instructions (Signed)
Anticoagulation Dose Instructions as of 01/24/2016      Brett Pearson Tue Wed Thu Fri Sat   New Dose 4 mg 6 mg 4 mg 6 mg 4 mg 6 mg 6 mg    Description        Continue current warfarin dose of 6mg   = 3 tablets daily except 4mg  = 2 tablets on Sunday, Tuesday and Thursday.      INR was 2.4 today

## 2016-02-13 DIAGNOSIS — E538 Deficiency of other specified B group vitamins: Secondary | ICD-10-CM | POA: Insufficient documentation

## 2016-03-07 ENCOUNTER — Ambulatory Visit (INDEPENDENT_AMBULATORY_CARE_PROVIDER_SITE_OTHER): Payer: Medicare Other | Admitting: Pharmacist

## 2016-03-07 DIAGNOSIS — I4891 Unspecified atrial fibrillation: Secondary | ICD-10-CM | POA: Diagnosis not present

## 2016-03-07 LAB — COAGUCHEK XS/INR WAIVED
INR: 2.8 — ABNORMAL HIGH (ref 0.9–1.1)
PROTHROMBIN TIME: 33.9 s

## 2016-03-17 ENCOUNTER — Other Ambulatory Visit: Payer: Self-pay | Admitting: Pharmacist

## 2016-04-18 ENCOUNTER — Ambulatory Visit (INDEPENDENT_AMBULATORY_CARE_PROVIDER_SITE_OTHER): Payer: Medicare Other | Admitting: Pharmacist

## 2016-04-18 DIAGNOSIS — I4891 Unspecified atrial fibrillation: Secondary | ICD-10-CM | POA: Diagnosis not present

## 2016-04-18 LAB — COAGUCHEK XS/INR WAIVED
INR: 2.6 — ABNORMAL HIGH (ref 0.9–1.1)
PROTHROMBIN TIME: 31.2 s

## 2016-05-16 ENCOUNTER — Telehealth: Payer: Self-pay | Admitting: Family Medicine

## 2016-05-16 NOTE — Telephone Encounter (Signed)
Patient having squamous cell mole removed tomorrow.  Recommend that he hold warfarin today for tomorrow's procedure.

## 2016-05-30 ENCOUNTER — Ambulatory Visit (INDEPENDENT_AMBULATORY_CARE_PROVIDER_SITE_OTHER): Payer: Medicare Other | Admitting: Pharmacist

## 2016-05-30 DIAGNOSIS — I4891 Unspecified atrial fibrillation: Secondary | ICD-10-CM | POA: Diagnosis not present

## 2016-05-30 LAB — COAGUCHEK XS/INR WAIVED
INR: 2.7 — ABNORMAL HIGH (ref 0.9–1.1)
Prothrombin Time: 31.8 s

## 2016-07-11 ENCOUNTER — Ambulatory Visit (INDEPENDENT_AMBULATORY_CARE_PROVIDER_SITE_OTHER): Payer: Medicare Other | Admitting: Pharmacist

## 2016-07-11 DIAGNOSIS — I4891 Unspecified atrial fibrillation: Secondary | ICD-10-CM | POA: Diagnosis not present

## 2016-07-11 LAB — COAGUCHEK XS/INR WAIVED
INR: 2.6 — ABNORMAL HIGH (ref 0.9–1.1)
Prothrombin Time: 31.4 s

## 2016-07-11 NOTE — Patient Instructions (Signed)
Anticoagulation Dose Instructions as of 07/11/2016      Brett Pearson Tue Wed Thu Fri Sat   New Dose 4 mg 6 mg 4 mg 6 mg 4 mg 6 mg 6 mg    Description   Continue current warfarin dose of 6mg   = 3 tablets daily except 4mg  = 2 tablets on Sunday, Tuesday and Thursday.     INR was 2.6 today

## 2016-08-03 DIAGNOSIS — R413 Other amnesia: Secondary | ICD-10-CM | POA: Insufficient documentation

## 2016-08-03 DIAGNOSIS — F0280 Dementia in other diseases classified elsewhere without behavioral disturbance: Secondary | ICD-10-CM | POA: Insufficient documentation

## 2016-08-08 DIAGNOSIS — G894 Chronic pain syndrome: Secondary | ICD-10-CM | POA: Diagnosis not present

## 2016-08-08 DIAGNOSIS — G546 Phantom limb syndrome with pain: Secondary | ICD-10-CM | POA: Diagnosis not present

## 2016-08-23 ENCOUNTER — Encounter: Payer: Self-pay | Admitting: Pharmacist

## 2016-08-28 ENCOUNTER — Telehealth: Payer: Self-pay | Admitting: Family Medicine

## 2016-08-28 NOTE — Telephone Encounter (Signed)
Recommend that patient not have INR checked until next week (around 09/05/16 or 09/06/16) is fine

## 2016-08-28 NOTE — Telephone Encounter (Signed)
Aware. Appointment made 09/06/16 wit Tammy.

## 2016-08-30 ENCOUNTER — Encounter: Payer: Self-pay | Admitting: Pharmacist

## 2016-08-31 ENCOUNTER — Encounter: Payer: Self-pay | Admitting: Family Medicine

## 2016-09-06 ENCOUNTER — Encounter: Payer: Self-pay | Admitting: Pharmacist

## 2016-09-06 ENCOUNTER — Ambulatory Visit (INDEPENDENT_AMBULATORY_CARE_PROVIDER_SITE_OTHER): Payer: PPO | Admitting: Pharmacist

## 2016-09-06 DIAGNOSIS — I4891 Unspecified atrial fibrillation: Secondary | ICD-10-CM | POA: Diagnosis not present

## 2016-09-06 LAB — COAGUCHEK XS/INR WAIVED
INR: 1.9 — ABNORMAL HIGH (ref 0.9–1.1)
PROTHROMBIN TIME: 23.3 s

## 2016-09-06 NOTE — Progress Notes (Signed)
Subjective:     Indication: atrial fibrillation Bleeding signs/symptoms: None Thromboembolic signs/symptoms: None  Missed Coumadin doses: missed 3 dose last week prior to and after dental procedure Medication changes: yes - took ABX prior to and after dental procedure Dietary changes: no Bacterial/viral infection: no Other concerns: no  The following portions of the patient's history were reviewed and updated as appropriate: allergies, current medications and problem list.    Objective:    INR Today: 1.9  Current dose: warfarin 2mg  tablets - take 3 tablet (=6mg ) daily except 2 tablets (=2mg ) on sundays, tuesdays and thursdays.  Assessment:    Subtherapeutic INR for goal of 2-3  - likely related to holding warfarin prior to dental procedure last week  Plan:    1. New dose: no change   2. Next INR: 1 month

## 2016-09-13 ENCOUNTER — Other Ambulatory Visit: Payer: Self-pay | Admitting: Pharmacist

## 2016-09-24 DIAGNOSIS — J189 Pneumonia, unspecified organism: Secondary | ICD-10-CM | POA: Diagnosis not present

## 2016-10-09 DIAGNOSIS — I70203 Unspecified atherosclerosis of native arteries of extremities, bilateral legs: Secondary | ICD-10-CM | POA: Diagnosis not present

## 2016-10-09 DIAGNOSIS — B351 Tinea unguium: Secondary | ICD-10-CM | POA: Diagnosis not present

## 2016-10-09 DIAGNOSIS — L84 Corns and callosities: Secondary | ICD-10-CM | POA: Diagnosis not present

## 2016-10-18 ENCOUNTER — Ambulatory Visit (INDEPENDENT_AMBULATORY_CARE_PROVIDER_SITE_OTHER): Payer: PPO | Admitting: Pharmacist

## 2016-10-18 DIAGNOSIS — I482 Chronic atrial fibrillation, unspecified: Secondary | ICD-10-CM

## 2016-10-18 DIAGNOSIS — I4891 Unspecified atrial fibrillation: Secondary | ICD-10-CM

## 2016-10-18 LAB — COAGUCHEK XS/INR WAIVED
INR: 3 — ABNORMAL HIGH (ref 0.9–1.1)
PROTHROMBIN TIME: 35.6 s

## 2016-10-22 DIAGNOSIS — Z0000 Encounter for general adult medical examination without abnormal findings: Secondary | ICD-10-CM | POA: Diagnosis not present

## 2016-11-29 ENCOUNTER — Ambulatory Visit (INDEPENDENT_AMBULATORY_CARE_PROVIDER_SITE_OTHER): Payer: PPO | Admitting: Pharmacist

## 2016-11-29 DIAGNOSIS — I482 Chronic atrial fibrillation, unspecified: Secondary | ICD-10-CM

## 2016-11-29 DIAGNOSIS — I4891 Unspecified atrial fibrillation: Secondary | ICD-10-CM

## 2016-11-29 LAB — COAGUCHEK XS/INR WAIVED
INR: 2.3 — AB (ref 0.9–1.1)
PROTHROMBIN TIME: 27.3 s

## 2016-11-29 MED ORDER — WARFARIN SODIUM 2 MG PO TABS: ORAL_TABLET | ORAL | 1 refills | Status: DC

## 2016-12-12 DIAGNOSIS — Z89511 Acquired absence of right leg below knee: Secondary | ICD-10-CM | POA: Diagnosis not present

## 2016-12-20 DIAGNOSIS — L814 Other melanin hyperpigmentation: Secondary | ICD-10-CM | POA: Diagnosis not present

## 2016-12-20 DIAGNOSIS — D1801 Hemangioma of skin and subcutaneous tissue: Secondary | ICD-10-CM | POA: Diagnosis not present

## 2016-12-20 DIAGNOSIS — L821 Other seborrheic keratosis: Secondary | ICD-10-CM | POA: Diagnosis not present

## 2016-12-20 DIAGNOSIS — Z85828 Personal history of other malignant neoplasm of skin: Secondary | ICD-10-CM | POA: Diagnosis not present

## 2016-12-27 DIAGNOSIS — Z125 Encounter for screening for malignant neoplasm of prostate: Secondary | ICD-10-CM | POA: Diagnosis not present

## 2016-12-27 DIAGNOSIS — Z0000 Encounter for general adult medical examination without abnormal findings: Secondary | ICD-10-CM | POA: Diagnosis not present

## 2016-12-27 DIAGNOSIS — I10 Essential (primary) hypertension: Secondary | ICD-10-CM | POA: Diagnosis not present

## 2017-01-09 DIAGNOSIS — R3129 Other microscopic hematuria: Secondary | ICD-10-CM | POA: Diagnosis not present

## 2017-01-09 DIAGNOSIS — R946 Abnormal results of thyroid function studies: Secondary | ICD-10-CM | POA: Diagnosis not present

## 2017-01-09 DIAGNOSIS — R634 Abnormal weight loss: Secondary | ICD-10-CM | POA: Diagnosis not present

## 2017-01-11 ENCOUNTER — Ambulatory Visit (INDEPENDENT_AMBULATORY_CARE_PROVIDER_SITE_OTHER): Payer: PPO | Admitting: Pharmacist

## 2017-01-11 DIAGNOSIS — I482 Chronic atrial fibrillation, unspecified: Secondary | ICD-10-CM

## 2017-01-11 DIAGNOSIS — I4891 Unspecified atrial fibrillation: Secondary | ICD-10-CM | POA: Diagnosis not present

## 2017-01-11 LAB — COAGUCHEK XS/INR WAIVED
INR: 2.1 — ABNORMAL HIGH (ref 0.9–1.1)
Prothrombin Time: 25.3 s

## 2017-01-11 NOTE — Patient Instructions (Signed)
Anticoagulation Warfarin Dose Instructions as of 01/11/2017      Brett Pearson Tue Wed Thu Fri Sat   New Dose 4 mg 6 mg 4 mg 6 mg 4 mg 6 mg 6 mg    Description   Continue current warfarin dose of 6mg   = 3 tablets daily except 4mg  = 2 tablets on Sunday, Tuesday and Thursday.  INR was 2.1 today (Goal: 2-3)

## 2017-01-14 DIAGNOSIS — I517 Cardiomegaly: Secondary | ICD-10-CM | POA: Diagnosis not present

## 2017-01-14 DIAGNOSIS — I700 Atherosclerosis of aorta: Secondary | ICD-10-CM | POA: Diagnosis not present

## 2017-01-14 DIAGNOSIS — K7460 Unspecified cirrhosis of liver: Secondary | ICD-10-CM | POA: Diagnosis not present

## 2017-01-15 DIAGNOSIS — I482 Chronic atrial fibrillation: Secondary | ICD-10-CM | POA: Diagnosis not present

## 2017-01-15 DIAGNOSIS — I318 Other specified diseases of pericardium: Secondary | ICD-10-CM | POA: Diagnosis not present

## 2017-01-15 DIAGNOSIS — Z7901 Long term (current) use of anticoagulants: Secondary | ICD-10-CM | POA: Diagnosis not present

## 2017-01-15 DIAGNOSIS — R002 Palpitations: Secondary | ICD-10-CM | POA: Diagnosis not present

## 2017-01-24 DIAGNOSIS — I4891 Unspecified atrial fibrillation: Secondary | ICD-10-CM | POA: Diagnosis not present

## 2017-01-29 DIAGNOSIS — B351 Tinea unguium: Secondary | ICD-10-CM | POA: Diagnosis not present

## 2017-01-29 DIAGNOSIS — L84 Corns and callosities: Secondary | ICD-10-CM | POA: Diagnosis not present

## 2017-01-29 DIAGNOSIS — I70203 Unspecified atherosclerosis of native arteries of extremities, bilateral legs: Secondary | ICD-10-CM | POA: Diagnosis not present

## 2017-01-29 DIAGNOSIS — M79676 Pain in unspecified toe(s): Secondary | ICD-10-CM | POA: Diagnosis not present

## 2017-01-30 DIAGNOSIS — Z89511 Acquired absence of right leg below knee: Secondary | ICD-10-CM | POA: Diagnosis not present

## 2017-01-30 DIAGNOSIS — R9389 Abnormal findings on diagnostic imaging of other specified body structures: Secondary | ICD-10-CM | POA: Insufficient documentation

## 2017-01-30 DIAGNOSIS — I482 Chronic atrial fibrillation: Secondary | ICD-10-CM | POA: Diagnosis not present

## 2017-01-30 DIAGNOSIS — I513 Intracardiac thrombosis, not elsewhere classified: Secondary | ICD-10-CM | POA: Diagnosis not present

## 2017-01-30 DIAGNOSIS — R0602 Shortness of breath: Secondary | ICD-10-CM | POA: Diagnosis not present

## 2017-01-30 DIAGNOSIS — I481 Persistent atrial fibrillation: Secondary | ICD-10-CM | POA: Diagnosis not present

## 2017-01-30 DIAGNOSIS — R413 Other amnesia: Secondary | ICD-10-CM | POA: Diagnosis not present

## 2017-01-30 DIAGNOSIS — R938 Abnormal findings on diagnostic imaging of other specified body structures: Secondary | ICD-10-CM | POA: Diagnosis not present

## 2017-02-08 DIAGNOSIS — J849 Interstitial pulmonary disease, unspecified: Secondary | ICD-10-CM | POA: Diagnosis not present

## 2017-02-08 DIAGNOSIS — R938 Abnormal findings on diagnostic imaging of other specified body structures: Secondary | ICD-10-CM | POA: Diagnosis not present

## 2017-02-08 DIAGNOSIS — J479 Bronchiectasis, uncomplicated: Secondary | ICD-10-CM | POA: Insufficient documentation

## 2017-02-08 DIAGNOSIS — I482 Chronic atrial fibrillation: Secondary | ICD-10-CM | POA: Diagnosis not present

## 2017-02-11 ENCOUNTER — Encounter: Payer: Self-pay | Admitting: *Deleted

## 2017-02-22 ENCOUNTER — Ambulatory Visit (INDEPENDENT_AMBULATORY_CARE_PROVIDER_SITE_OTHER): Payer: PPO | Admitting: Pharmacist

## 2017-02-22 DIAGNOSIS — I4891 Unspecified atrial fibrillation: Secondary | ICD-10-CM

## 2017-02-22 DIAGNOSIS — I482 Chronic atrial fibrillation, unspecified: Secondary | ICD-10-CM

## 2017-02-22 NOTE — Patient Instructions (Signed)
Anticoagulation Warfarin Dose Instructions as of 02/22/2017      Brett Pearson Tue Wed Thu Fri Sat   New Dose 4 mg 6 mg 4 mg 6 mg 4 mg 6 mg 6 mg    Description   Take an extra 1/2 tablet today - July 20th 2018.  Then continue current warfarin dose of 6mg   = 3 tablets daily except 4mg  = 2 tablets on Sunday, Tuesday and Thursday.  INR was 1.8 today (Goal: 2-3)

## 2017-02-26 LAB — COAGUCHEK XS/INR WAIVED
INR: 1.8 — AB (ref 0.9–1.1)
Prothrombin Time: 21.9 s

## 2017-03-06 DIAGNOSIS — Z79891 Long term (current) use of opiate analgesic: Secondary | ICD-10-CM | POA: Diagnosis not present

## 2017-03-06 DIAGNOSIS — G546 Phantom limb syndrome with pain: Secondary | ICD-10-CM | POA: Insufficient documentation

## 2017-03-13 DIAGNOSIS — K7460 Unspecified cirrhosis of liver: Secondary | ICD-10-CM | POA: Diagnosis not present

## 2017-03-13 DIAGNOSIS — R634 Abnormal weight loss: Secondary | ICD-10-CM | POA: Diagnosis not present

## 2017-03-13 DIAGNOSIS — I10 Essential (primary) hypertension: Secondary | ICD-10-CM | POA: Diagnosis not present

## 2017-03-13 DIAGNOSIS — Z23 Encounter for immunization: Secondary | ICD-10-CM | POA: Diagnosis not present

## 2017-03-13 DIAGNOSIS — R946 Abnormal results of thyroid function studies: Secondary | ICD-10-CM | POA: Diagnosis not present

## 2017-03-20 DIAGNOSIS — R634 Abnormal weight loss: Secondary | ICD-10-CM | POA: Diagnosis not present

## 2017-03-20 DIAGNOSIS — K7460 Unspecified cirrhosis of liver: Secondary | ICD-10-CM | POA: Diagnosis not present

## 2017-03-21 ENCOUNTER — Ambulatory Visit (INDEPENDENT_AMBULATORY_CARE_PROVIDER_SITE_OTHER): Payer: PPO | Admitting: Pharmacist

## 2017-03-21 DIAGNOSIS — I482 Chronic atrial fibrillation, unspecified: Secondary | ICD-10-CM

## 2017-03-21 DIAGNOSIS — I4891 Unspecified atrial fibrillation: Secondary | ICD-10-CM | POA: Diagnosis not present

## 2017-03-21 DIAGNOSIS — K7460 Unspecified cirrhosis of liver: Secondary | ICD-10-CM | POA: Diagnosis not present

## 2017-03-21 LAB — COAGUCHEK XS/INR WAIVED
INR: 2.6 — AB (ref 0.9–1.1)
Prothrombin Time: 30.9 s

## 2017-03-21 NOTE — Patient Instructions (Signed)
Anticoagulation Warfarin Dose Instructions as of 03/21/2017      Brett Pearson Tue Wed Thu Fri Sat   New Dose 4 mg 6 mg 4 mg 6 mg 4 mg 6 mg 6 mg    Description   Continue current warfarin dose of 6mg   = 3 tablets daily except 4mg  = 2 tablets on Sunday, Tuesday and Thursday.  INR was 2.6 today (Goal: 2-3)

## 2017-03-27 DIAGNOSIS — R938 Abnormal findings on diagnostic imaging of other specified body structures: Secondary | ICD-10-CM | POA: Diagnosis not present

## 2017-03-28 DIAGNOSIS — H26492 Other secondary cataract, left eye: Secondary | ICD-10-CM | POA: Diagnosis not present

## 2017-03-28 DIAGNOSIS — H04123 Dry eye syndrome of bilateral lacrimal glands: Secondary | ICD-10-CM | POA: Insufficient documentation

## 2017-03-28 DIAGNOSIS — H52203 Unspecified astigmatism, bilateral: Secondary | ICD-10-CM | POA: Diagnosis not present

## 2017-03-28 DIAGNOSIS — Z961 Presence of intraocular lens: Secondary | ICD-10-CM | POA: Insufficient documentation

## 2017-03-28 DIAGNOSIS — H43813 Vitreous degeneration, bilateral: Secondary | ICD-10-CM | POA: Diagnosis not present

## 2017-04-03 DIAGNOSIS — H26492 Other secondary cataract, left eye: Secondary | ICD-10-CM | POA: Diagnosis not present

## 2017-04-15 DIAGNOSIS — Z23 Encounter for immunization: Secondary | ICD-10-CM | POA: Diagnosis not present

## 2017-04-16 DIAGNOSIS — H52203 Unspecified astigmatism, bilateral: Secondary | ICD-10-CM | POA: Insufficient documentation

## 2017-04-22 DIAGNOSIS — K7460 Unspecified cirrhosis of liver: Secondary | ICD-10-CM | POA: Diagnosis not present

## 2017-04-30 DIAGNOSIS — L84 Corns and callosities: Secondary | ICD-10-CM | POA: Diagnosis not present

## 2017-04-30 DIAGNOSIS — I70203 Unspecified atherosclerosis of native arteries of extremities, bilateral legs: Secondary | ICD-10-CM | POA: Diagnosis not present

## 2017-04-30 DIAGNOSIS — M79676 Pain in unspecified toe(s): Secondary | ICD-10-CM | POA: Diagnosis not present

## 2017-04-30 DIAGNOSIS — B351 Tinea unguium: Secondary | ICD-10-CM | POA: Diagnosis not present

## 2017-05-03 ENCOUNTER — Ambulatory Visit (INDEPENDENT_AMBULATORY_CARE_PROVIDER_SITE_OTHER): Payer: PPO | Admitting: Pharmacist Clinician (PhC)/ Clinical Pharmacy Specialist

## 2017-05-03 DIAGNOSIS — I482 Chronic atrial fibrillation, unspecified: Secondary | ICD-10-CM

## 2017-05-03 DIAGNOSIS — I4891 Unspecified atrial fibrillation: Secondary | ICD-10-CM

## 2017-05-03 LAB — COAGUCHEK XS/INR WAIVED
INR: 2.6 — ABNORMAL HIGH (ref 0.9–1.1)
Prothrombin Time: 31 s

## 2017-05-03 NOTE — Patient Instructions (Signed)
Anticoagulation Warfarin Dose Instructions as of 05/03/2017      Dorene Grebe Tue Wed Thu Fri Sat   New Dose 4 mg 6 mg 4 mg 6 mg 4 mg 6 mg 6 mg    Description   Continue current warfarin dose of 6mg   = 3 tablets daily except 4mg  = 2 tablets on Sunday, Tuesday and Thursday.  INR was 2.6 today (Goal: 2-3)

## 2017-05-06 DIAGNOSIS — I513 Intracardiac thrombosis, not elsewhere classified: Secondary | ICD-10-CM | POA: Diagnosis not present

## 2017-05-06 DIAGNOSIS — Z1211 Encounter for screening for malignant neoplasm of colon: Secondary | ICD-10-CM | POA: Diagnosis not present

## 2017-05-06 DIAGNOSIS — I482 Chronic atrial fibrillation: Secondary | ICD-10-CM | POA: Diagnosis not present

## 2017-05-06 DIAGNOSIS — K7460 Unspecified cirrhosis of liver: Secondary | ICD-10-CM | POA: Diagnosis not present

## 2017-05-08 DIAGNOSIS — J849 Interstitial pulmonary disease, unspecified: Secondary | ICD-10-CM | POA: Diagnosis not present

## 2017-05-08 DIAGNOSIS — R9389 Abnormal findings on diagnostic imaging of other specified body structures: Secondary | ICD-10-CM | POA: Diagnosis not present

## 2017-05-08 DIAGNOSIS — J479 Bronchiectasis, uncomplicated: Secondary | ICD-10-CM | POA: Diagnosis not present

## 2017-05-08 DIAGNOSIS — I482 Chronic atrial fibrillation: Secondary | ICD-10-CM | POA: Diagnosis not present

## 2017-05-13 ENCOUNTER — Other Ambulatory Visit: Payer: Self-pay | Admitting: *Deleted

## 2017-05-13 MED ORDER — WARFARIN SODIUM 2 MG PO TABS: ORAL_TABLET | ORAL | 1 refills | Status: DC

## 2017-06-13 DIAGNOSIS — R269 Unspecified abnormalities of gait and mobility: Secondary | ICD-10-CM | POA: Diagnosis not present

## 2017-06-13 DIAGNOSIS — I10 Essential (primary) hypertension: Secondary | ICD-10-CM | POA: Diagnosis not present

## 2017-06-13 DIAGNOSIS — R7989 Other specified abnormal findings of blood chemistry: Secondary | ICD-10-CM | POA: Diagnosis not present

## 2017-06-13 DIAGNOSIS — R3129 Other microscopic hematuria: Secondary | ICD-10-CM | POA: Diagnosis not present

## 2017-06-21 ENCOUNTER — Ambulatory Visit (INDEPENDENT_AMBULATORY_CARE_PROVIDER_SITE_OTHER): Payer: PPO | Admitting: Pharmacist Clinician (PhC)/ Clinical Pharmacy Specialist

## 2017-06-21 DIAGNOSIS — I4891 Unspecified atrial fibrillation: Secondary | ICD-10-CM

## 2017-06-21 DIAGNOSIS — I482 Chronic atrial fibrillation, unspecified: Secondary | ICD-10-CM

## 2017-06-21 LAB — COAGUCHEK XS/INR WAIVED
INR: 3.3 — ABNORMAL HIGH (ref 0.9–1.1)
PROTHROMBIN TIME: 39.7 s

## 2017-06-21 NOTE — Patient Instructions (Signed)
Description   Take 1 tablet today only then Continue current warfarin dose of 6mg   = 3 tablets daily except 4mg  = 2 tablets on Sunday, Tuesday and Thursday.  INR was 3.3 today (Goal: 2-3)

## 2017-08-09 ENCOUNTER — Encounter: Payer: Self-pay | Admitting: Pharmacist Clinician (PhC)/ Clinical Pharmacy Specialist

## 2017-08-10 ENCOUNTER — Telehealth: Payer: Self-pay | Admitting: Family Medicine

## 2017-08-12 DIAGNOSIS — D122 Benign neoplasm of ascending colon: Secondary | ICD-10-CM | POA: Diagnosis not present

## 2017-08-12 DIAGNOSIS — Z1211 Encounter for screening for malignant neoplasm of colon: Secondary | ICD-10-CM | POA: Diagnosis not present

## 2017-08-12 DIAGNOSIS — K641 Second degree hemorrhoids: Secondary | ICD-10-CM | POA: Diagnosis not present

## 2017-08-12 DIAGNOSIS — K621 Rectal polyp: Secondary | ICD-10-CM | POA: Diagnosis not present

## 2017-08-12 DIAGNOSIS — K7469 Other cirrhosis of liver: Secondary | ICD-10-CM | POA: Diagnosis not present

## 2017-08-12 DIAGNOSIS — K7460 Unspecified cirrhosis of liver: Secondary | ICD-10-CM | POA: Diagnosis not present

## 2017-08-12 DIAGNOSIS — D126 Benign neoplasm of colon, unspecified: Secondary | ICD-10-CM | POA: Diagnosis not present

## 2017-08-12 DIAGNOSIS — D124 Benign neoplasm of descending colon: Secondary | ICD-10-CM | POA: Diagnosis not present

## 2017-08-12 DIAGNOSIS — Z86010 Personal history of colonic polyps: Secondary | ICD-10-CM | POA: Diagnosis not present

## 2017-08-12 NOTE — Telephone Encounter (Signed)
Ok with that plan, Would restart the following day and recommend INR with Sharyn Lull, a clinical pharmacist in our office,  in 1 week.   Laroy Apple, MD [facility] 08/12/2017, 9:05 AM

## 2017-08-12 NOTE — Telephone Encounter (Signed)
LMTCB

## 2017-08-13 ENCOUNTER — Telehealth: Payer: Self-pay | Admitting: Family Medicine

## 2017-08-13 DIAGNOSIS — L84 Corns and callosities: Secondary | ICD-10-CM | POA: Diagnosis not present

## 2017-08-13 DIAGNOSIS — M79676 Pain in unspecified toe(s): Secondary | ICD-10-CM | POA: Diagnosis not present

## 2017-08-13 DIAGNOSIS — B351 Tinea unguium: Secondary | ICD-10-CM | POA: Diagnosis not present

## 2017-08-13 DIAGNOSIS — I70203 Unspecified atherosclerosis of native arteries of extremities, bilateral legs: Secondary | ICD-10-CM | POA: Diagnosis not present

## 2017-08-13 NOTE — Telephone Encounter (Signed)
Pt has appt on 08/23/2017 with Sharyn Lull Per Michelle's directions pt will resume previous dose and rck INR at appt

## 2017-08-23 ENCOUNTER — Ambulatory Visit (INDEPENDENT_AMBULATORY_CARE_PROVIDER_SITE_OTHER): Payer: PPO | Admitting: Pharmacist Clinician (PhC)/ Clinical Pharmacy Specialist

## 2017-08-23 DIAGNOSIS — I482 Chronic atrial fibrillation, unspecified: Secondary | ICD-10-CM

## 2017-08-23 DIAGNOSIS — I4891 Unspecified atrial fibrillation: Secondary | ICD-10-CM | POA: Diagnosis not present

## 2017-08-23 LAB — COAGUCHEK XS/INR WAIVED
INR: 2.5 — AB (ref 0.9–1.1)
Prothrombin Time: 29.5 s

## 2017-08-23 NOTE — Patient Instructions (Signed)
Description   Continue current warfarin dose of 6mg   = 3 tablets daily except 4mg  = 2 tablets on Sunday, Tuesday and Thursday.  INR is 2.5  (Goal: 2-3)

## 2017-08-29 DIAGNOSIS — K7460 Unspecified cirrhosis of liver: Secondary | ICD-10-CM | POA: Diagnosis not present

## 2017-08-29 DIAGNOSIS — R3129 Other microscopic hematuria: Secondary | ICD-10-CM | POA: Diagnosis not present

## 2017-09-16 DIAGNOSIS — K7030 Alcoholic cirrhosis of liver without ascites: Secondary | ICD-10-CM | POA: Diagnosis not present

## 2017-09-16 DIAGNOSIS — K7460 Unspecified cirrhosis of liver: Secondary | ICD-10-CM | POA: Diagnosis not present

## 2017-09-18 DIAGNOSIS — K7469 Other cirrhosis of liver: Secondary | ICD-10-CM | POA: Diagnosis not present

## 2017-10-02 DIAGNOSIS — G546 Phantom limb syndrome with pain: Secondary | ICD-10-CM | POA: Diagnosis not present

## 2017-10-11 ENCOUNTER — Ambulatory Visit (INDEPENDENT_AMBULATORY_CARE_PROVIDER_SITE_OTHER): Payer: PPO | Admitting: Pharmacist Clinician (PhC)/ Clinical Pharmacy Specialist

## 2017-10-11 DIAGNOSIS — I482 Chronic atrial fibrillation, unspecified: Secondary | ICD-10-CM

## 2017-10-11 DIAGNOSIS — I4891 Unspecified atrial fibrillation: Secondary | ICD-10-CM | POA: Diagnosis not present

## 2017-10-11 LAB — COAGUCHEK XS/INR WAIVED
INR: 2.5 — AB (ref 0.9–1.1)
Prothrombin Time: 30.5 s

## 2017-10-11 NOTE — Patient Instructions (Signed)
Description   Continue current warfarin dose of 6mg   = 3 tablets daily except 4mg  = 2 tablets on Sunday, Tuesday and Thursday.  INR is 2.5  (Goal: 2-3)

## 2017-10-14 DIAGNOSIS — R269 Unspecified abnormalities of gait and mobility: Secondary | ICD-10-CM | POA: Insufficient documentation

## 2017-10-14 DIAGNOSIS — E669 Obesity, unspecified: Secondary | ICD-10-CM | POA: Diagnosis not present

## 2017-10-14 DIAGNOSIS — E559 Vitamin D deficiency, unspecified: Secondary | ICD-10-CM | POA: Diagnosis not present

## 2017-10-14 DIAGNOSIS — G14 Postpolio syndrome: Secondary | ICD-10-CM | POA: Insufficient documentation

## 2017-10-14 DIAGNOSIS — I10 Essential (primary) hypertension: Secondary | ICD-10-CM | POA: Diagnosis not present

## 2017-10-14 DIAGNOSIS — Z23 Encounter for immunization: Secondary | ICD-10-CM | POA: Diagnosis not present

## 2017-11-05 DIAGNOSIS — I10 Essential (primary) hypertension: Secondary | ICD-10-CM | POA: Diagnosis not present

## 2017-11-07 DIAGNOSIS — R0989 Other specified symptoms and signs involving the circulatory and respiratory systems: Secondary | ICD-10-CM | POA: Diagnosis not present

## 2017-11-07 DIAGNOSIS — R413 Other amnesia: Secondary | ICD-10-CM | POA: Diagnosis not present

## 2017-11-07 DIAGNOSIS — S81802A Unspecified open wound, left lower leg, initial encounter: Secondary | ICD-10-CM | POA: Diagnosis not present

## 2017-11-07 DIAGNOSIS — D7589 Other specified diseases of blood and blood-forming organs: Secondary | ICD-10-CM | POA: Diagnosis not present

## 2017-11-07 DIAGNOSIS — R32 Unspecified urinary incontinence: Secondary | ICD-10-CM | POA: Diagnosis not present

## 2017-11-07 DIAGNOSIS — I10 Essential (primary) hypertension: Secondary | ICD-10-CM | POA: Diagnosis not present

## 2017-11-21 DIAGNOSIS — R269 Unspecified abnormalities of gait and mobility: Secondary | ICD-10-CM | POA: Diagnosis not present

## 2017-11-21 DIAGNOSIS — I872 Venous insufficiency (chronic) (peripheral): Secondary | ICD-10-CM | POA: Diagnosis not present

## 2017-11-21 DIAGNOSIS — Z89519 Acquired absence of unspecified leg below knee: Secondary | ICD-10-CM | POA: Diagnosis not present

## 2017-11-27 DIAGNOSIS — K449 Diaphragmatic hernia without obstruction or gangrene: Secondary | ICD-10-CM | POA: Diagnosis not present

## 2017-11-27 DIAGNOSIS — R0989 Other specified symptoms and signs involving the circulatory and respiratory systems: Secondary | ICD-10-CM | POA: Diagnosis not present

## 2017-11-28 DIAGNOSIS — M79676 Pain in unspecified toe(s): Secondary | ICD-10-CM | POA: Diagnosis not present

## 2017-11-28 DIAGNOSIS — I70203 Unspecified atherosclerosis of native arteries of extremities, bilateral legs: Secondary | ICD-10-CM | POA: Diagnosis not present

## 2017-11-28 DIAGNOSIS — L84 Corns and callosities: Secondary | ICD-10-CM | POA: Diagnosis not present

## 2017-11-28 DIAGNOSIS — B351 Tinea unguium: Secondary | ICD-10-CM | POA: Diagnosis not present

## 2017-12-06 ENCOUNTER — Encounter: Payer: Self-pay | Admitting: Family Medicine

## 2017-12-06 ENCOUNTER — Ambulatory Visit (INDEPENDENT_AMBULATORY_CARE_PROVIDER_SITE_OTHER): Payer: PPO | Admitting: Family Medicine

## 2017-12-06 VITALS — BP 117/69 | HR 61 | Temp 96.7°F | Ht 73.0 in | Wt 217.8 lb

## 2017-12-06 DIAGNOSIS — I482 Chronic atrial fibrillation, unspecified: Secondary | ICD-10-CM

## 2017-12-06 LAB — COAGUCHEK XS/INR WAIVED
INR: 3.3 — AB (ref 0.9–1.1)
PROTHROMBIN TIME: 39.6 s

## 2017-12-06 NOTE — Progress Notes (Signed)
   HPI  Patient presents today here for INR check.  Patient states that he takes 3 tablets of Coumadin on Sunday, Monday, Wednesday, Friday, 2 tablets on other days.  He has had one nosebleed that stopped in less than 5 minutes. He denies any other bleeding He denies any diet changes.  PMH: Smoking status noted ROS: Per HPI  Objective: BP 117/69   Pulse 61   Temp (!) 96.7 F (35.9 C) (Oral)   Ht 6\' 1"  (1.854 m)   Wt 217 lb 12.8 oz (98.8 kg)   BMI 28.74 kg/m  Gen: NAD, alert, cooperative with exam HEENT: NCAT Ext: Lower extremity BKA Neuro: Alert and oriented, No gross deficits  Assessment and plan:  #Chronic anticoagulation due to chronic atrial fibrillation Rate controlled INR is slightly supratherapeutic today, total weekly dose is 36 mg, reduced to 34 mg, approximately 5% change in dose Repeat in 2 to 3 weeks    Orders Placed This Encounter  Procedures  . CoaguChek XS/INR Carney Bern, MD [facility] 12/06/2017, 1:17 PM

## 2017-12-21 DIAGNOSIS — Z89519 Acquired absence of unspecified leg below knee: Secondary | ICD-10-CM | POA: Diagnosis not present

## 2017-12-21 DIAGNOSIS — I872 Venous insufficiency (chronic) (peripheral): Secondary | ICD-10-CM | POA: Diagnosis not present

## 2017-12-21 DIAGNOSIS — R269 Unspecified abnormalities of gait and mobility: Secondary | ICD-10-CM | POA: Diagnosis not present

## 2017-12-23 DIAGNOSIS — Z89511 Acquired absence of right leg below knee: Secondary | ICD-10-CM | POA: Diagnosis not present

## 2017-12-24 DIAGNOSIS — T17308D Unspecified foreign body in larynx causing other injury, subsequent encounter: Secondary | ICD-10-CM | POA: Diagnosis not present

## 2017-12-24 DIAGNOSIS — R1310 Dysphagia, unspecified: Secondary | ICD-10-CM | POA: Diagnosis not present

## 2017-12-25 DIAGNOSIS — R32 Unspecified urinary incontinence: Secondary | ICD-10-CM | POA: Diagnosis not present

## 2017-12-25 DIAGNOSIS — N390 Urinary tract infection, site not specified: Secondary | ICD-10-CM | POA: Diagnosis not present

## 2017-12-27 ENCOUNTER — Ambulatory Visit: Payer: PPO | Admitting: Pharmacist Clinician (PhC)/ Clinical Pharmacy Specialist

## 2017-12-27 DIAGNOSIS — I482 Chronic atrial fibrillation, unspecified: Secondary | ICD-10-CM

## 2017-12-27 LAB — COAGUCHEK XS/INR WAIVED
INR: 2.3 — AB (ref 0.9–1.1)
PROTHROMBIN TIME: 27.1 s

## 2017-12-27 NOTE — Patient Instructions (Signed)
Description   Take warfarin 6mg  on Mondays, Wednesdays, Fridays and Saturdays and 4mg  all other days of the week.  INR is 2.3  (Goal: 2-3)

## 2018-01-14 ENCOUNTER — Other Ambulatory Visit: Payer: Self-pay | Admitting: Family Medicine

## 2018-01-15 DIAGNOSIS — Z0000 Encounter for general adult medical examination without abnormal findings: Secondary | ICD-10-CM | POA: Diagnosis not present

## 2018-01-15 DIAGNOSIS — I10 Essential (primary) hypertension: Secondary | ICD-10-CM | POA: Diagnosis not present

## 2018-01-15 DIAGNOSIS — E559 Vitamin D deficiency, unspecified: Secondary | ICD-10-CM | POA: Diagnosis not present

## 2018-01-15 DIAGNOSIS — H6123 Impacted cerumen, bilateral: Secondary | ICD-10-CM | POA: Diagnosis not present

## 2018-01-15 DIAGNOSIS — E538 Deficiency of other specified B group vitamins: Secondary | ICD-10-CM | POA: Diagnosis not present

## 2018-01-21 DIAGNOSIS — Z89519 Acquired absence of unspecified leg below knee: Secondary | ICD-10-CM | POA: Diagnosis not present

## 2018-01-21 DIAGNOSIS — I872 Venous insufficiency (chronic) (peripheral): Secondary | ICD-10-CM | POA: Diagnosis not present

## 2018-01-21 DIAGNOSIS — R269 Unspecified abnormalities of gait and mobility: Secondary | ICD-10-CM | POA: Diagnosis not present

## 2018-01-22 DIAGNOSIS — Z89519 Acquired absence of unspecified leg below knee: Secondary | ICD-10-CM | POA: Diagnosis not present

## 2018-01-22 DIAGNOSIS — I872 Venous insufficiency (chronic) (peripheral): Secondary | ICD-10-CM | POA: Diagnosis not present

## 2018-01-22 DIAGNOSIS — R269 Unspecified abnormalities of gait and mobility: Secondary | ICD-10-CM | POA: Diagnosis not present

## 2018-01-23 DIAGNOSIS — M6250 Muscle wasting and atrophy, not elsewhere classified, unspecified site: Secondary | ICD-10-CM | POA: Diagnosis not present

## 2018-01-23 DIAGNOSIS — Z89511 Acquired absence of right leg below knee: Secondary | ICD-10-CM | POA: Diagnosis not present

## 2018-01-23 DIAGNOSIS — R29898 Other symptoms and signs involving the musculoskeletal system: Secondary | ICD-10-CM | POA: Diagnosis not present

## 2018-01-23 DIAGNOSIS — R1310 Dysphagia, unspecified: Secondary | ICD-10-CM | POA: Diagnosis not present

## 2018-01-24 ENCOUNTER — Other Ambulatory Visit: Payer: PPO

## 2018-01-24 DIAGNOSIS — R1310 Dysphagia, unspecified: Secondary | ICD-10-CM | POA: Diagnosis not present

## 2018-01-24 DIAGNOSIS — M6250 Muscle wasting and atrophy, not elsewhere classified, unspecified site: Secondary | ICD-10-CM | POA: Diagnosis not present

## 2018-01-28 DIAGNOSIS — G546 Phantom limb syndrome with pain: Secondary | ICD-10-CM | POA: Diagnosis not present

## 2018-01-28 DIAGNOSIS — G894 Chronic pain syndrome: Secondary | ICD-10-CM | POA: Insufficient documentation

## 2018-01-31 ENCOUNTER — Ambulatory Visit (INDEPENDENT_AMBULATORY_CARE_PROVIDER_SITE_OTHER): Payer: PPO | Admitting: Pharmacist Clinician (PhC)/ Clinical Pharmacy Specialist

## 2018-01-31 DIAGNOSIS — I482 Chronic atrial fibrillation, unspecified: Secondary | ICD-10-CM

## 2018-01-31 LAB — COAGUCHEK XS/INR WAIVED
INR: 1.7 — ABNORMAL HIGH (ref 0.9–1.1)
PROTHROMBIN TIME: 21 s

## 2018-01-31 NOTE — Patient Instructions (Signed)
Description   Change to taking 6mg  a day except for Sundays and Thursdays take 4mg .  INR is 1.7  (Goal: 2-3) too thick today

## 2018-02-11 DIAGNOSIS — I70203 Unspecified atherosclerosis of native arteries of extremities, bilateral legs: Secondary | ICD-10-CM | POA: Diagnosis not present

## 2018-02-11 DIAGNOSIS — B351 Tinea unguium: Secondary | ICD-10-CM | POA: Diagnosis not present

## 2018-02-11 DIAGNOSIS — M79676 Pain in unspecified toe(s): Secondary | ICD-10-CM | POA: Diagnosis not present

## 2018-02-11 DIAGNOSIS — L84 Corns and callosities: Secondary | ICD-10-CM | POA: Diagnosis not present

## 2018-02-20 DIAGNOSIS — Z89519 Acquired absence of unspecified leg below knee: Secondary | ICD-10-CM | POA: Diagnosis not present

## 2018-02-20 DIAGNOSIS — I872 Venous insufficiency (chronic) (peripheral): Secondary | ICD-10-CM | POA: Diagnosis not present

## 2018-02-20 DIAGNOSIS — R269 Unspecified abnormalities of gait and mobility: Secondary | ICD-10-CM | POA: Diagnosis not present

## 2018-02-28 ENCOUNTER — Ambulatory Visit (INDEPENDENT_AMBULATORY_CARE_PROVIDER_SITE_OTHER): Payer: PPO | Admitting: Pharmacist Clinician (PhC)/ Clinical Pharmacy Specialist

## 2018-02-28 DIAGNOSIS — I482 Chronic atrial fibrillation, unspecified: Secondary | ICD-10-CM

## 2018-02-28 LAB — COAGUCHEK XS/INR WAIVED
INR: 1.8 — AB (ref 0.9–1.1)
PROTHROMBIN TIME: 21.9 s

## 2018-02-28 NOTE — Patient Instructions (Signed)
Description   Change to taking 6mg  every day of the week  INR is 1.8  (Goal: 2-3) too thick today

## 2018-03-06 DIAGNOSIS — D649 Anemia, unspecified: Secondary | ICD-10-CM | POA: Diagnosis not present

## 2018-03-19 ENCOUNTER — Ambulatory Visit: Payer: PPO | Admitting: Family Medicine

## 2018-03-19 DIAGNOSIS — I482 Chronic atrial fibrillation, unspecified: Secondary | ICD-10-CM

## 2018-03-19 LAB — COAGUCHEK XS/INR WAIVED
INR: 2.7 — AB (ref 0.9–1.1)
Prothrombin Time: 32.9 s

## 2018-03-19 NOTE — Patient Instructions (Signed)
Description   Change back to 4 mg on Tues, Thurs and Sat and 6 mg all other days  INR is 2.7  (Goal: 2-3)  Follow up with michelle in 10 days

## 2018-03-19 NOTE — Progress Notes (Signed)
Description   Change back to 4 mg on Tues, Thurs and Sat and 6 mg all other days  INR is 2.7  (Goal: 2-3)  Follow up with michelle in 10 days

## 2018-03-20 DIAGNOSIS — Z86010 Personal history of colonic polyps: Secondary | ICD-10-CM | POA: Diagnosis not present

## 2018-03-20 DIAGNOSIS — K7469 Other cirrhosis of liver: Secondary | ICD-10-CM | POA: Diagnosis not present

## 2018-03-20 DIAGNOSIS — K219 Gastro-esophageal reflux disease without esophagitis: Secondary | ICD-10-CM | POA: Insufficient documentation

## 2018-03-23 DIAGNOSIS — I872 Venous insufficiency (chronic) (peripheral): Secondary | ICD-10-CM | POA: Diagnosis not present

## 2018-03-23 DIAGNOSIS — Z89519 Acquired absence of unspecified leg below knee: Secondary | ICD-10-CM | POA: Diagnosis not present

## 2018-03-23 DIAGNOSIS — R269 Unspecified abnormalities of gait and mobility: Secondary | ICD-10-CM | POA: Diagnosis not present

## 2018-03-28 ENCOUNTER — Ambulatory Visit (INDEPENDENT_AMBULATORY_CARE_PROVIDER_SITE_OTHER): Payer: PPO | Admitting: Pharmacist Clinician (PhC)/ Clinical Pharmacy Specialist

## 2018-03-28 ENCOUNTER — Encounter: Payer: Self-pay | Admitting: Pharmacist Clinician (PhC)/ Clinical Pharmacy Specialist

## 2018-03-28 DIAGNOSIS — I482 Chronic atrial fibrillation, unspecified: Secondary | ICD-10-CM

## 2018-03-28 LAB — COAGUCHEK XS/INR WAIVED
INR: 2.1 — AB (ref 0.9–1.1)
PROTHROMBIN TIME: 24.9 s

## 2018-03-28 NOTE — Patient Instructions (Signed)
Description   Increase to 6mg  a day except for Tuesdays and Thursdays take 4mg   INR is 2.1  (Goal: 2-3)

## 2018-04-17 DIAGNOSIS — E559 Vitamin D deficiency, unspecified: Secondary | ICD-10-CM | POA: Diagnosis not present

## 2018-04-17 DIAGNOSIS — K7460 Unspecified cirrhosis of liver: Secondary | ICD-10-CM | POA: Diagnosis not present

## 2018-04-17 DIAGNOSIS — K224 Dyskinesia of esophagus: Secondary | ICD-10-CM | POA: Diagnosis not present

## 2018-04-17 DIAGNOSIS — K7469 Other cirrhosis of liver: Secondary | ICD-10-CM | POA: Diagnosis not present

## 2018-04-17 DIAGNOSIS — Z23 Encounter for immunization: Secondary | ICD-10-CM | POA: Diagnosis not present

## 2018-04-17 DIAGNOSIS — I10 Essential (primary) hypertension: Secondary | ICD-10-CM | POA: Diagnosis not present

## 2018-04-17 DIAGNOSIS — E538 Deficiency of other specified B group vitamins: Secondary | ICD-10-CM | POA: Diagnosis not present

## 2018-04-21 DIAGNOSIS — H524 Presbyopia: Secondary | ICD-10-CM | POA: Diagnosis not present

## 2018-04-21 DIAGNOSIS — H43813 Vitreous degeneration, bilateral: Secondary | ICD-10-CM | POA: Insufficient documentation

## 2018-04-21 DIAGNOSIS — H5203 Hypermetropia, bilateral: Secondary | ICD-10-CM | POA: Diagnosis not present

## 2018-04-21 DIAGNOSIS — H02834 Dermatochalasis of left upper eyelid: Secondary | ICD-10-CM | POA: Insufficient documentation

## 2018-04-21 DIAGNOSIS — H04123 Dry eye syndrome of bilateral lacrimal glands: Secondary | ICD-10-CM | POA: Diagnosis not present

## 2018-04-21 DIAGNOSIS — H16223 Keratoconjunctivitis sicca, not specified as Sjogren's, bilateral: Secondary | ICD-10-CM | POA: Diagnosis not present

## 2018-04-21 DIAGNOSIS — H02831 Dermatochalasis of right upper eyelid: Secondary | ICD-10-CM | POA: Diagnosis not present

## 2018-04-21 DIAGNOSIS — Z961 Presence of intraocular lens: Secondary | ICD-10-CM | POA: Diagnosis not present

## 2018-04-23 DIAGNOSIS — Z89519 Acquired absence of unspecified leg below knee: Secondary | ICD-10-CM | POA: Diagnosis not present

## 2018-04-23 DIAGNOSIS — I872 Venous insufficiency (chronic) (peripheral): Secondary | ICD-10-CM | POA: Diagnosis not present

## 2018-04-23 DIAGNOSIS — R269 Unspecified abnormalities of gait and mobility: Secondary | ICD-10-CM | POA: Diagnosis not present

## 2018-04-25 DIAGNOSIS — I8312 Varicose veins of left lower extremity with inflammation: Secondary | ICD-10-CM | POA: Diagnosis not present

## 2018-04-25 DIAGNOSIS — D225 Melanocytic nevi of trunk: Secondary | ICD-10-CM | POA: Diagnosis not present

## 2018-04-25 DIAGNOSIS — Z85828 Personal history of other malignant neoplasm of skin: Secondary | ICD-10-CM | POA: Diagnosis not present

## 2018-04-25 DIAGNOSIS — L821 Other seborrheic keratosis: Secondary | ICD-10-CM | POA: Diagnosis not present

## 2018-04-25 DIAGNOSIS — L814 Other melanin hyperpigmentation: Secondary | ICD-10-CM | POA: Diagnosis not present

## 2018-04-25 DIAGNOSIS — D1801 Hemangioma of skin and subcutaneous tissue: Secondary | ICD-10-CM | POA: Diagnosis not present

## 2018-04-30 ENCOUNTER — Ambulatory Visit (INDEPENDENT_AMBULATORY_CARE_PROVIDER_SITE_OTHER): Payer: PPO | Admitting: Pharmacist Clinician (PhC)/ Clinical Pharmacy Specialist

## 2018-04-30 DIAGNOSIS — I482 Chronic atrial fibrillation, unspecified: Secondary | ICD-10-CM

## 2018-04-30 LAB — COAGUCHEK XS/INR WAIVED
INR: 2.2 — AB (ref 0.9–1.1)
PROTHROMBIN TIME: 27 s

## 2018-04-30 NOTE — Patient Instructions (Signed)
Description   Continue taking 6mg  a day except for Tuesdays and Thursdays take 4mg   INR is 2.2  (Goal: 2-3)

## 2018-05-07 DIAGNOSIS — I4819 Other persistent atrial fibrillation: Secondary | ICD-10-CM | POA: Diagnosis not present

## 2018-05-07 DIAGNOSIS — I4891 Unspecified atrial fibrillation: Secondary | ICD-10-CM | POA: Diagnosis not present

## 2018-05-07 DIAGNOSIS — R0989 Other specified symptoms and signs involving the circulatory and respiratory systems: Secondary | ICD-10-CM | POA: Diagnosis not present

## 2018-05-08 DIAGNOSIS — I482 Chronic atrial fibrillation, unspecified: Secondary | ICD-10-CM | POA: Diagnosis not present

## 2018-05-08 DIAGNOSIS — J479 Bronchiectasis, uncomplicated: Secondary | ICD-10-CM | POA: Diagnosis not present

## 2018-05-08 DIAGNOSIS — I311 Chronic constrictive pericarditis: Secondary | ICD-10-CM | POA: Diagnosis not present

## 2018-05-08 DIAGNOSIS — J849 Interstitial pulmonary disease, unspecified: Secondary | ICD-10-CM | POA: Diagnosis not present

## 2018-05-08 DIAGNOSIS — I517 Cardiomegaly: Secondary | ICD-10-CM | POA: Diagnosis not present

## 2018-05-08 DIAGNOSIS — R9389 Abnormal findings on diagnostic imaging of other specified body structures: Secondary | ICD-10-CM | POA: Diagnosis not present

## 2018-05-15 DIAGNOSIS — I70203 Unspecified atherosclerosis of native arteries of extremities, bilateral legs: Secondary | ICD-10-CM | POA: Diagnosis not present

## 2018-05-15 DIAGNOSIS — L84 Corns and callosities: Secondary | ICD-10-CM | POA: Diagnosis not present

## 2018-05-15 DIAGNOSIS — B351 Tinea unguium: Secondary | ICD-10-CM | POA: Diagnosis not present

## 2018-05-15 DIAGNOSIS — M79676 Pain in unspecified toe(s): Secondary | ICD-10-CM | POA: Diagnosis not present

## 2018-05-21 DIAGNOSIS — G894 Chronic pain syndrome: Secondary | ICD-10-CM | POA: Diagnosis not present

## 2018-05-21 DIAGNOSIS — G546 Phantom limb syndrome with pain: Secondary | ICD-10-CM | POA: Diagnosis not present

## 2018-05-23 DIAGNOSIS — Z89519 Acquired absence of unspecified leg below knee: Secondary | ICD-10-CM | POA: Diagnosis not present

## 2018-05-23 DIAGNOSIS — I872 Venous insufficiency (chronic) (peripheral): Secondary | ICD-10-CM | POA: Diagnosis not present

## 2018-05-23 DIAGNOSIS — R269 Unspecified abnormalities of gait and mobility: Secondary | ICD-10-CM | POA: Diagnosis not present

## 2018-06-11 ENCOUNTER — Ambulatory Visit (INDEPENDENT_AMBULATORY_CARE_PROVIDER_SITE_OTHER): Payer: PPO | Admitting: Pharmacist Clinician (PhC)/ Clinical Pharmacy Specialist

## 2018-06-11 DIAGNOSIS — I482 Chronic atrial fibrillation, unspecified: Secondary | ICD-10-CM | POA: Diagnosis not present

## 2018-06-11 LAB — COAGUCHEK XS/INR WAIVED
INR: 1.9 — ABNORMAL HIGH (ref 0.9–1.1)
PROTHROMBIN TIME: 23.4 s

## 2018-06-11 NOTE — Patient Instructions (Signed)
Description   Continue taking 6mg  a day except for Tuesdays and Thursdays take 4mg   INR is 1.9 (Goal: 2-3) A little thick today

## 2018-06-20 ENCOUNTER — Other Ambulatory Visit: Payer: Self-pay | Admitting: *Deleted

## 2018-06-20 MED ORDER — WARFARIN SODIUM 2 MG PO TABS: ORAL_TABLET | ORAL | 0 refills | Status: DC

## 2018-06-23 DIAGNOSIS — R269 Unspecified abnormalities of gait and mobility: Secondary | ICD-10-CM | POA: Diagnosis not present

## 2018-06-23 DIAGNOSIS — Z89519 Acquired absence of unspecified leg below knee: Secondary | ICD-10-CM | POA: Diagnosis not present

## 2018-06-23 DIAGNOSIS — I872 Venous insufficiency (chronic) (peripheral): Secondary | ICD-10-CM | POA: Diagnosis not present

## 2018-07-14 DIAGNOSIS — J069 Acute upper respiratory infection, unspecified: Secondary | ICD-10-CM | POA: Diagnosis not present

## 2018-07-14 DIAGNOSIS — I311 Chronic constrictive pericarditis: Secondary | ICD-10-CM | POA: Diagnosis not present

## 2018-07-14 DIAGNOSIS — R05 Cough: Secondary | ICD-10-CM | POA: Diagnosis not present

## 2018-07-23 ENCOUNTER — Ambulatory Visit (INDEPENDENT_AMBULATORY_CARE_PROVIDER_SITE_OTHER): Payer: PPO | Admitting: Pharmacist Clinician (PhC)/ Clinical Pharmacy Specialist

## 2018-07-23 DIAGNOSIS — R269 Unspecified abnormalities of gait and mobility: Secondary | ICD-10-CM | POA: Diagnosis not present

## 2018-07-23 DIAGNOSIS — Z89519 Acquired absence of unspecified leg below knee: Secondary | ICD-10-CM | POA: Diagnosis not present

## 2018-07-23 DIAGNOSIS — I482 Chronic atrial fibrillation, unspecified: Secondary | ICD-10-CM | POA: Diagnosis not present

## 2018-07-23 DIAGNOSIS — I872 Venous insufficiency (chronic) (peripheral): Secondary | ICD-10-CM | POA: Diagnosis not present

## 2018-07-23 LAB — COAGUCHEK XS/INR WAIVED
INR: 4.1 — AB (ref 0.9–1.1)
PROTHROMBIN TIME: 49.7 s

## 2018-07-23 NOTE — Patient Instructions (Signed)
Description   No warfarin today, 1 tablet tomorrow, then resume regular schedule  INR is 4.1 (Goal: 2-3) Too thin today

## 2018-07-27 DIAGNOSIS — R21 Rash and other nonspecific skin eruption: Secondary | ICD-10-CM | POA: Diagnosis not present

## 2018-07-27 DIAGNOSIS — Z9713 Presence of artificial right leg (complete) (partial): Secondary | ICD-10-CM | POA: Diagnosis not present

## 2018-07-27 DIAGNOSIS — Z7901 Long term (current) use of anticoagulants: Secondary | ICD-10-CM | POA: Diagnosis not present

## 2018-07-27 DIAGNOSIS — Z87891 Personal history of nicotine dependence: Secondary | ICD-10-CM | POA: Diagnosis not present

## 2018-07-27 DIAGNOSIS — Z7951 Long term (current) use of inhaled steroids: Secondary | ICD-10-CM | POA: Diagnosis not present

## 2018-07-27 DIAGNOSIS — Z881 Allergy status to other antibiotic agents status: Secondary | ICD-10-CM | POA: Diagnosis not present

## 2018-07-27 DIAGNOSIS — I10 Essential (primary) hypertension: Secondary | ICD-10-CM | POA: Diagnosis not present

## 2018-07-27 DIAGNOSIS — Z79899 Other long term (current) drug therapy: Secondary | ICD-10-CM | POA: Diagnosis not present

## 2018-07-27 DIAGNOSIS — B354 Tinea corporis: Secondary | ICD-10-CM | POA: Diagnosis not present

## 2018-07-31 DIAGNOSIS — N390 Urinary tract infection, site not specified: Secondary | ICD-10-CM | POA: Diagnosis not present

## 2018-07-31 DIAGNOSIS — B354 Tinea corporis: Secondary | ICD-10-CM | POA: Insufficient documentation

## 2018-07-31 DIAGNOSIS — J069 Acute upper respiratory infection, unspecified: Secondary | ICD-10-CM | POA: Diagnosis not present

## 2018-07-31 DIAGNOSIS — R32 Unspecified urinary incontinence: Secondary | ICD-10-CM | POA: Insufficient documentation

## 2018-07-31 DIAGNOSIS — M19012 Primary osteoarthritis, left shoulder: Secondary | ICD-10-CM | POA: Diagnosis not present

## 2018-07-31 DIAGNOSIS — R05 Cough: Secondary | ICD-10-CM | POA: Diagnosis not present

## 2018-07-31 DIAGNOSIS — I700 Atherosclerosis of aorta: Secondary | ICD-10-CM | POA: Diagnosis not present

## 2018-07-31 DIAGNOSIS — M19011 Primary osteoarthritis, right shoulder: Secondary | ICD-10-CM | POA: Diagnosis not present

## 2018-07-31 DIAGNOSIS — I517 Cardiomegaly: Secondary | ICD-10-CM | POA: Diagnosis not present

## 2018-08-04 ENCOUNTER — Ambulatory Visit (INDEPENDENT_AMBULATORY_CARE_PROVIDER_SITE_OTHER): Payer: PPO | Admitting: Pharmacist Clinician (PhC)/ Clinical Pharmacy Specialist

## 2018-08-04 DIAGNOSIS — I482 Chronic atrial fibrillation, unspecified: Secondary | ICD-10-CM | POA: Diagnosis not present

## 2018-08-04 LAB — COAGUCHEK XS/INR WAIVED
INR: 7.7 (ref 0.9–1.1)
Prothrombin Time: 93 s

## 2018-08-04 LAB — HEMOGLOBIN, FINGERSTICK: Hemoglobin: 13.3 g/dL (ref 12.6–17.7)

## 2018-08-04 NOTE — Patient Instructions (Signed)
Description   No warfarin 12/30, 12/31, 1/1.  Recheck on 1/2. Your INR today is 7.7 it is thin from the Doxycycline and Septra DS started at [facility] received 2.5mg  of oral vitamin K in office today.   Call 911 if you have an signs or symptoms of bleeding Take precautions against falls and head injuries Eat high vitamin K foods

## 2018-08-07 ENCOUNTER — Encounter: Payer: Self-pay | Admitting: Family Medicine

## 2018-08-07 ENCOUNTER — Ambulatory Visit (INDEPENDENT_AMBULATORY_CARE_PROVIDER_SITE_OTHER): Payer: PPO | Admitting: Family Medicine

## 2018-08-07 VITALS — BP 140/76 | HR 73 | Temp 97.0°F | Ht 73.0 in | Wt 214.6 lb

## 2018-08-07 DIAGNOSIS — I482 Chronic atrial fibrillation, unspecified: Secondary | ICD-10-CM | POA: Diagnosis not present

## 2018-08-07 LAB — COAGUCHEK XS/INR WAIVED
INR: 2.9 — ABNORMAL HIGH (ref 0.9–1.1)
PROTHROMBIN TIME: 34.3 s

## 2018-08-07 NOTE — Progress Notes (Signed)
BP 140/76   Pulse 73   Temp (!) 97 F (36.1 C) (Oral)   Ht 6\' 1"  (1.854 m)   Wt 214 lb 9.6 oz (97.3 kg)   BMI 28.31 kg/m    Subjective:    Patient ID: Brett Duffel., male    DOB: 18-Oct-1940, 78 y.o.   MRN: [id number]  HPI: Brett Moster. is a 78 y.o. male presenting on 08/07/2018 for Coagulation Disorder   HPI Chronic coagulation disorder for A. Fib Patient is coming in for recheck of his Coumadin levels and INR levels because of his A. fib.  He was as high as 7.7- 3 days ago because of 2 antibiotics including Macrobid and doxycycline that he was taking.  3 days ago he was given a tablet of vitamin K and today his INR is 2.9.  He also had stopped the antibiotics that he was taking including doxycycline and Macrobid.  He says he still is having UTI symptoms and still having chest congestion and bronchitis and still needs to take the antibiotic but just has not restarted until we cleared him to do so.  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  He did have a small amount of bruising on his left hand and did have one nosebleed 2 days ago but it stopped on its own and he has not had any since.  Relevant past medical, surgical, family and social history reviewed and updated as indicated. Interim medical history since our last visit reviewed. Allergies and medications reviewed and updated.  Review of Systems  Constitutional: Negative for chills and fever.  HENT: Positive for congestion, sinus pressure and sinus pain.   Respiratory: Positive for cough. Negative for shortness of breath and wheezing.   Cardiovascular: Negative for chest pain and leg swelling.  Endocrine: Negative for cold intolerance and heat intolerance.  Musculoskeletal: Negative for back pain and gait problem.  Skin: Positive for color change.       Unable to visualize because is too difficult for him to take off his prosthetic leg  right now  All other systems reviewed and are negative.   Per HPI unless specifically indicated above   Allergies as of 08/07/2018      Reactions   Amoxicillin    Other reaction(s): HIVES      Medication List       Accurate as of August 07, 2018 10:33 AM. Always use your most recent med list.        cholecalciferol 1000 units tablet Commonly known as:  VITAMIN D Take 1,000 Units by mouth daily.   DULoxetine 60 MG capsule Commonly known as:  CYMBALTA Take 60 mg by mouth daily.   fluticasone 50 MCG/ACT nasal spray Commonly known as:  FLONASE   gabapentin 300 MG capsule Commonly known as:  NEURONTIN Take 4 capsules by mouth 3 (three) times daily.   HYDROcodone-acetaminophen 7.5-325 MG tablet Commonly known as:  NORCO Take 1 tablet by mouth every 4 (four) hours as needed.   MAGONATE 500 (27 Mg) MG Tabs Generic drug:  Magnesium Gluconate Take 500 mg by mouth.   metoprolol succinate 25 MG 24 hr tablet Commonly known as:  TOPROL-XL Take 25 mg by mouth daily.   multivitamin tablet Take 1 tablet by mouth daily.   potassium chloride 10 MEQ CR capsule Commonly known as:  MICRO-K Take 1 capsule  by mouth 3 (three) times daily.   VITAMIN B-12 PO Take by mouth daily.   warfarin 2 MG tablet Commonly known as:  COUMADIN Take as directed by the anticoagulation clinic. If you are unsure how to take this medication, talk to your nurse or doctor. Original instructions:  TAKE 2 OR 3 TABLETS BY MOUTH DAILY AS RECOMMENDED BY COUMADIN CLINIC.          Objective:    BP 140/76   Pulse 73   Temp (!) 97 F (36.1 C) (Oral)   Ht 6\' 1"  (1.854 m)   Wt 214 lb 9.6 oz (97.3 kg)   BMI 28.31 kg/m   Wt Readings from Last 3 Encounters:  08/07/18 214 lb 9.6 oz (97.3 kg)  12/06/17 217 lb 12.8 oz (98.8 kg)  03/17/15 259 lb (117.5 kg)    Physical Exam Vitals signs and nursing note reviewed.  Constitutional:      General: He is not in acute distress.    Appearance: He is  well-developed. He is not diaphoretic.  HENT:     Right Ear: Tympanic membrane, ear canal and external ear normal.     Left Ear: Tympanic membrane, ear canal and external ear normal.     Nose: Mucosal edema and rhinorrhea present.     Right Sinus: Maxillary sinus tenderness present. No frontal sinus tenderness.     Left Sinus: Maxillary sinus tenderness present. No frontal sinus tenderness.     Mouth/Throat:     Pharynx: Uvula midline. Posterior oropharyngeal erythema present. No oropharyngeal exudate.     Tonsils: No tonsillar abscesses.  Eyes:     General: No scleral icterus.       Right eye: No discharge.     Conjunctiva/sclera: Conjunctivae normal.     Pupils: Pupils are equal, round, and reactive to light.  Neck:     Musculoskeletal: Neck supple.     Thyroid: No thyromegaly.  Cardiovascular:     Rate and Rhythm: Normal rate and regular rhythm.     Heart sounds: Normal heart sounds. No murmur.  Pulmonary:     Effort: Pulmonary effort is normal. No respiratory distress.     Breath sounds: Normal breath sounds. No wheezing or rales.  Musculoskeletal: Normal range of motion.  Lymphadenopathy:     Cervical: No cervical adenopathy.  Skin:    General: Skin is warm and dry.     Findings: No rash.  Neurological:     Mental Status: He is alert and oriented to person, place, and time.     Coordination: Coordination normal.  Psychiatric:        Behavior: Behavior normal.     Description   Will restart warfarin today and restart doxycycline today, will half his dose of warfarin that he normally takes and have him come back next week for recheck Your INR today is 2.9        Assessment & Plan:   Problem List Items Addressed This Visit      Cardiovascular and Mediastinum   Chronic atrial fibrillation - Primary   Relevant Orders   CoaguChek XS/INR Waived (Completed)      Restart antibiotic for UTI and chest congestion and have lowered his Coumadin dose to 3 mg daily while on  the antibiotic and come back for a check next week. Follow up plan: Return in about 6 days (around 08/13/2018), or if symptoms worsen or fail to improve, for Coumadin recheck with [facility].  Counseling provided for all of  the vaccine components Orders Placed This Encounter  Procedures  . CoaguChek XS/INR [place], MD [facility] 08/07/2018, 10:33 AM

## 2018-08-12 ENCOUNTER — Other Ambulatory Visit: Payer: Self-pay | Admitting: Family Medicine

## 2018-08-13 ENCOUNTER — Ambulatory Visit (INDEPENDENT_AMBULATORY_CARE_PROVIDER_SITE_OTHER): Payer: PPO | Admitting: Pharmacist Clinician (PhC)/ Clinical Pharmacy Specialist

## 2018-08-13 DIAGNOSIS — I482 Chronic atrial fibrillation, unspecified: Secondary | ICD-10-CM | POA: Diagnosis not present

## 2018-08-13 LAB — COAGUCHEK XS/INR WAIVED
INR: 2.4 — ABNORMAL HIGH (ref 0.9–1.1)
PROTHROMBIN TIME: 29 s

## 2018-08-13 NOTE — Patient Instructions (Signed)
Description   Continue taking warfarin 3mg  a day  INR today is 2.4 (goal is 2-3)  Perfect reading today

## 2018-08-23 DIAGNOSIS — Z89519 Acquired absence of unspecified leg below knee: Secondary | ICD-10-CM | POA: Diagnosis not present

## 2018-08-23 DIAGNOSIS — R269 Unspecified abnormalities of gait and mobility: Secondary | ICD-10-CM | POA: Diagnosis not present

## 2018-08-23 DIAGNOSIS — I872 Venous insufficiency (chronic) (peripheral): Secondary | ICD-10-CM | POA: Diagnosis not present

## 2018-08-26 DIAGNOSIS — R3129 Other microscopic hematuria: Secondary | ICD-10-CM | POA: Insufficient documentation

## 2018-08-26 DIAGNOSIS — R1310 Dysphagia, unspecified: Secondary | ICD-10-CM | POA: Insufficient documentation

## 2018-08-26 DIAGNOSIS — J849 Interstitial pulmonary disease, unspecified: Secondary | ICD-10-CM | POA: Diagnosis not present

## 2018-08-27 ENCOUNTER — Telehealth: Payer: Self-pay | Admitting: Family Medicine

## 2018-08-27 ENCOUNTER — Ambulatory Visit: Payer: PPO | Admitting: Pharmacist Clinician (PhC)/ Clinical Pharmacy Specialist

## 2018-08-27 DIAGNOSIS — I482 Chronic atrial fibrillation, unspecified: Secondary | ICD-10-CM

## 2018-08-27 LAB — COAGUCHEK XS/INR WAIVED
INR: 1.2 — ABNORMAL HIGH (ref 0.9–1.1)
Prothrombin Time: 14.3 s

## 2018-08-27 NOTE — Telephone Encounter (Signed)
PT is wanting to talk to Sharyn Lull about one of his other doctors told him that he would have to take an antibiotic to get approval to take this medication.

## 2018-08-27 NOTE — Telephone Encounter (Signed)
Patient was seen today by pharmacist.  Please review and respond to patient.

## 2018-08-27 NOTE — Patient Instructions (Signed)
Description   Change to taking 2 tablets (4mg ) on Tuesdays, Thursdays, and Saturdays and 3 tablets (6mg ) all other days of the week.  INR today is 1.2 (goal is 2-3)  Too thick today

## 2018-08-28 ENCOUNTER — Telehealth: Payer: Self-pay | Admitting: Family Medicine

## 2018-08-28 NOTE — Telephone Encounter (Addendum)
Patients wife states that he was put on macrobid yesterday for UTI by internal medicine provider. Patients wife states that Dr. Karle Starch is currently his pcp but our office controls INR. Wife states they do no know what dose coumadin to give today and also doesn't know whether he should start antibiotic. She also states that they plan on transferring care to you. Please advise

## 2018-08-28 NOTE — Telephone Encounter (Signed)
Aware to take half of his normal dose while on the antibiotic. He has an appt next week with Sharyn Lull, they are going to keep this appt anyways. He is on the antibiotic for 10 days

## 2018-08-28 NOTE — Telephone Encounter (Signed)
lmtcb

## 2018-08-28 NOTE — Telephone Encounter (Signed)
Go ahead and start the antibiotic and while he is on the antibiotic take half of his normal Coumadin dose every day until finished and then come back in a week and 1/2 to 2 weeks to get a Coumadin recheck.  2 days after finishing the antibiotic go back to his normal dose

## 2018-09-03 ENCOUNTER — Encounter: Payer: Self-pay | Admitting: Pharmacist Clinician (PhC)/ Clinical Pharmacy Specialist

## 2018-09-03 ENCOUNTER — Ambulatory Visit (INDEPENDENT_AMBULATORY_CARE_PROVIDER_SITE_OTHER): Payer: PPO | Admitting: Pharmacist Clinician (PhC)/ Clinical Pharmacy Specialist

## 2018-09-03 DIAGNOSIS — I482 Chronic atrial fibrillation, unspecified: Secondary | ICD-10-CM | POA: Diagnosis not present

## 2018-09-03 LAB — COAGUCHEK XS/INR WAIVED
INR: 1.1 (ref 0.9–1.1)
PROTHROMBIN TIME: 13.6 s

## 2018-09-03 NOTE — Patient Instructions (Signed)
Description   Change to taking 2 tablets (4mg ) on Tuesdays, Thursdays, and Saturdays and 3 tablets (6mg ) all other days of the week.  INR today is 1.1 (goal is 2-3)  Too thick today

## 2018-09-10 ENCOUNTER — Ambulatory Visit: Payer: PPO | Admitting: Pharmacist Clinician (PhC)/ Clinical Pharmacy Specialist

## 2018-09-10 DIAGNOSIS — I482 Chronic atrial fibrillation, unspecified: Secondary | ICD-10-CM | POA: Diagnosis not present

## 2018-09-10 LAB — COAGUCHEK XS/INR WAIVED
INR: 1.7 — ABNORMAL HIGH (ref 0.9–1.1)
Prothrombin Time: 20.7 s

## 2018-09-10 NOTE — Patient Instructions (Signed)
Description   Change to taking 2 tablets (4mg ) on Tuesdays and Saturdays and 3 tablets (6mg ) all other days of the week.  INR today is 1.7 (goal is 2-3)  Slightly thick today

## 2018-09-23 DIAGNOSIS — R269 Unspecified abnormalities of gait and mobility: Secondary | ICD-10-CM | POA: Diagnosis not present

## 2018-09-23 DIAGNOSIS — Z89519 Acquired absence of unspecified leg below knee: Secondary | ICD-10-CM | POA: Diagnosis not present

## 2018-09-23 DIAGNOSIS — I872 Venous insufficiency (chronic) (peripheral): Secondary | ICD-10-CM | POA: Diagnosis not present

## 2018-09-24 ENCOUNTER — Ambulatory Visit: Payer: PPO | Admitting: Pharmacist Clinician (PhC)/ Clinical Pharmacy Specialist

## 2018-09-24 DIAGNOSIS — I482 Chronic atrial fibrillation, unspecified: Secondary | ICD-10-CM | POA: Diagnosis not present

## 2018-09-24 LAB — COAGUCHEK XS/INR WAIVED
INR: 2.6 — ABNORMAL HIGH (ref 0.9–1.1)
Prothrombin Time: 31.1 s

## 2018-09-24 NOTE — Patient Instructions (Signed)
Description   Continue taking  2 tablets (4mg ) on Tuesdays and Saturdays and 3 tablets (6mg ) all other days of the week.  INR today is 2.6 (goal is 2-3)  Perfect reading today!

## 2018-10-16 DIAGNOSIS — E538 Deficiency of other specified B group vitamins: Secondary | ICD-10-CM | POA: Diagnosis not present

## 2018-10-16 DIAGNOSIS — R1310 Dysphagia, unspecified: Secondary | ICD-10-CM | POA: Diagnosis not present

## 2018-10-16 DIAGNOSIS — I10 Essential (primary) hypertension: Secondary | ICD-10-CM | POA: Diagnosis not present

## 2018-10-16 DIAGNOSIS — E559 Vitamin D deficiency, unspecified: Secondary | ICD-10-CM | POA: Diagnosis not present

## 2018-10-16 DIAGNOSIS — M19012 Primary osteoarthritis, left shoulder: Secondary | ICD-10-CM | POA: Diagnosis not present

## 2018-10-16 DIAGNOSIS — M19011 Primary osteoarthritis, right shoulder: Secondary | ICD-10-CM | POA: Diagnosis not present

## 2018-10-16 DIAGNOSIS — R3129 Other microscopic hematuria: Secondary | ICD-10-CM | POA: Diagnosis not present

## 2018-10-21 DIAGNOSIS — R8290 Unspecified abnormal findings in urine: Secondary | ICD-10-CM | POA: Diagnosis not present

## 2018-10-22 DIAGNOSIS — R269 Unspecified abnormalities of gait and mobility: Secondary | ICD-10-CM | POA: Diagnosis not present

## 2018-10-22 DIAGNOSIS — Z89519 Acquired absence of unspecified leg below knee: Secondary | ICD-10-CM | POA: Diagnosis not present

## 2018-10-22 DIAGNOSIS — I872 Venous insufficiency (chronic) (peripheral): Secondary | ICD-10-CM | POA: Diagnosis not present

## 2018-10-29 ENCOUNTER — Ambulatory Visit: Payer: PPO | Admitting: Pharmacist Clinician (PhC)/ Clinical Pharmacy Specialist

## 2018-10-29 ENCOUNTER — Other Ambulatory Visit: Payer: Self-pay

## 2018-10-29 DIAGNOSIS — I482 Chronic atrial fibrillation, unspecified: Secondary | ICD-10-CM

## 2018-10-29 LAB — COAGUCHEK XS/INR WAIVED
INR: 2.3 — ABNORMAL HIGH (ref 0.9–1.1)
Prothrombin Time: 27.7 s

## 2018-10-29 NOTE — Patient Instructions (Signed)
Description   Change to taking 4mg  ( 2 tablets) a day except for Mondays and Fridays 2mg  (1 tablet)  Doxycycline started yesterday for 7 days.  INR today is 2.3 (goal is 2-3)  Perfect reading today!

## 2018-11-05 ENCOUNTER — Other Ambulatory Visit: Payer: Self-pay

## 2018-11-05 ENCOUNTER — Ambulatory Visit (INDEPENDENT_AMBULATORY_CARE_PROVIDER_SITE_OTHER): Payer: PPO | Admitting: Pharmacist Clinician (PhC)/ Clinical Pharmacy Specialist

## 2018-11-05 DIAGNOSIS — I482 Chronic atrial fibrillation, unspecified: Secondary | ICD-10-CM

## 2018-11-05 DIAGNOSIS — S8291XA Unspecified fracture of right lower leg, initial encounter for closed fracture: Secondary | ICD-10-CM | POA: Diagnosis not present

## 2018-11-05 DIAGNOSIS — L0390 Cellulitis, unspecified: Secondary | ICD-10-CM | POA: Diagnosis not present

## 2018-11-05 LAB — COAGUCHEK XS/INR WAIVED
INR: 1.5 — ABNORMAL HIGH (ref 0.9–1.1)
Prothrombin Time: 18.1 s

## 2018-11-05 NOTE — Patient Instructions (Signed)
Description   Go back to regular schedule:  6mg  every day except for Tuesdays and Saturdays take 4mg   INR today is 1.5 (goal is 2-3) too thick today

## 2018-11-22 DIAGNOSIS — R269 Unspecified abnormalities of gait and mobility: Secondary | ICD-10-CM | POA: Diagnosis not present

## 2018-11-22 DIAGNOSIS — Z89519 Acquired absence of unspecified leg below knee: Secondary | ICD-10-CM | POA: Diagnosis not present

## 2018-11-22 DIAGNOSIS — I872 Venous insufficiency (chronic) (peripheral): Secondary | ICD-10-CM | POA: Diagnosis not present

## 2018-12-03 ENCOUNTER — Ambulatory Visit: Payer: PPO | Admitting: Pharmacist Clinician (PhC)/ Clinical Pharmacy Specialist

## 2018-12-03 ENCOUNTER — Other Ambulatory Visit: Payer: Self-pay

## 2018-12-03 DIAGNOSIS — I482 Chronic atrial fibrillation, unspecified: Secondary | ICD-10-CM

## 2018-12-03 LAB — COAGUCHEK XS/INR WAIVED
INR: 3.1 — ABNORMAL HIGH (ref 0.9–1.1)
Prothrombin Time: 37.7 s

## 2018-12-03 NOTE — Patient Instructions (Signed)
Description    6mg  every day except for Tuesdays and Saturdays take 4mg   INR today is 3.1  (goal is 2-3) slightly thin   Eat a high vitamin food 1-2 times every week

## 2018-12-17 ENCOUNTER — Other Ambulatory Visit: Payer: Self-pay | Admitting: *Deleted

## 2018-12-22 DIAGNOSIS — Z89519 Acquired absence of unspecified leg below knee: Secondary | ICD-10-CM | POA: Diagnosis not present

## 2018-12-22 DIAGNOSIS — I872 Venous insufficiency (chronic) (peripheral): Secondary | ICD-10-CM | POA: Diagnosis not present

## 2018-12-22 DIAGNOSIS — R269 Unspecified abnormalities of gait and mobility: Secondary | ICD-10-CM | POA: Diagnosis not present

## 2019-01-02 ENCOUNTER — Telehealth: Payer: Self-pay | Admitting: Family Medicine

## 2019-01-05 ENCOUNTER — Other Ambulatory Visit: Payer: Self-pay

## 2019-01-07 ENCOUNTER — Other Ambulatory Visit: Payer: Self-pay

## 2019-01-07 ENCOUNTER — Ambulatory Visit (INDEPENDENT_AMBULATORY_CARE_PROVIDER_SITE_OTHER): Payer: PPO | Admitting: Pharmacist Clinician (PhC)/ Clinical Pharmacy Specialist

## 2019-01-07 ENCOUNTER — Ambulatory Visit (INDEPENDENT_AMBULATORY_CARE_PROVIDER_SITE_OTHER): Payer: PPO | Admitting: *Deleted

## 2019-01-07 ENCOUNTER — Encounter: Payer: Self-pay | Admitting: Pharmacist Clinician (PhC)/ Clinical Pharmacy Specialist

## 2019-01-07 VITALS — Ht 73.0 in | Wt 214.0 lb

## 2019-01-07 DIAGNOSIS — Z0000 Encounter for general adult medical examination without abnormal findings: Secondary | ICD-10-CM

## 2019-01-07 DIAGNOSIS — I482 Chronic atrial fibrillation, unspecified: Secondary | ICD-10-CM

## 2019-01-07 LAB — COAGUCHEK XS/INR WAIVED
INR: 2.9 — ABNORMAL HIGH (ref 0.9–1.1)
Prothrombin Time: 35 s

## 2019-01-07 NOTE — Progress Notes (Signed)
MEDICARE ANNUAL WELLNESS VISIT  01/07/2019  Telephone Visit Disclaimer This Medicare AWV was conducted by telephone due to national recommendations for restrictions regarding the COVID-19 Pandemic (e.g. social distancing).  I verified, using two identifiers, that I am speaking with Brett Pearson. or their authorized healthcare agent. I discussed the limitations, risks, security, and privacy concerns of performing an evaluation and management service by telephone and the potential availability of an in-person appointment in the future. The patient expressed understanding and agreed to proceed.   Subjective:  Brett Pearson. is a 78 y.o. male patient of Brett Pearson who had a Medicare Annual Wellness Visit today via telephone. Brett Pearson is Retired and lives with their spouse. he has 4 children. he reports that he is socially active and does interact with friends/family regularly. he is minimally physically active and enjoys gardening.  Patient Care Team: Brett Pearson as PCP - General (Family Medicine)  Advanced Directives 01/07/2019  Does Patient Have a Medical Advance Directive? Yes  Type of Paramedic of [place];Living will  Does patient want to make changes to medical advance directive? No - Patient declined  Copy of [place] in Chart? No - copy requested    Hospital Utilization Over the Past 12 Months: # of hospitalizations or ER visits: 0 # of surgeries: 0  Review of Systems    Patient reports that his overall health is unchanged compared to last year.  Patient Reported Readings (BP, Pulse, CBG, Weight, etc) none  Review of Systems:   All other systems negative.  Pain Assessment Pain : No/denies pain     Current Medications & Allergies (verified) Allergies as of 01/07/2019      Reactions   Amoxicillin    Other reaction(s): HIVES      Medication List       Accurate as of January 07, 2019  3:07 PM. If  you have any questions, ask your nurse or doctor.        cholecalciferol 1000 units tablet Commonly known as:  VITAMIN D Take 1,000 Units by mouth daily.   DULoxetine 60 MG capsule Commonly known as:  CYMBALTA Take 60 mg by mouth daily.   fluticasone 50 MCG/ACT nasal spray Commonly known as:  FLONASE   gabapentin 300 MG capsule Commonly known as:  NEURONTIN Take 4 capsules by mouth 3 (three) times daily.   Magonate 500 (27 Mg) MG Tabs Generic drug:  Magnesium Gluconate Take 500 mg by mouth.   metoprolol succinate 25 MG 24 hr tablet Commonly known as:  TOPROL-XL Take 25 mg by mouth daily.   multivitamin tablet Take 1 tablet by mouth daily.   potassium chloride 10 MEQ CR capsule Commonly known as:  MICRO-K Take 1 capsule by mouth 3 (three) times daily.   VITAMIN B-12 PO Take by mouth daily.   warfarin 2 MG tablet Commonly known as:  COUMADIN Take as directed by the anticoagulation clinic. If you are unsure how to take this medication, talk to your nurse or doctor. Original instructions:  TAKE 2 OR 3 TABLETS BY MOUTH DAILY AS RECOMMENDED BY COUMADIN CLINIC.       History (reviewed): Past Medical History:  Diagnosis Date  . Atrial fibrillation ([place])   . Edema   . Hypokalemia   . Neuropathy    bilateral feet   . 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 Venous insufficiency  History reviewed. No pertinent surgical history. History reviewed. No pertinent family history. Social History   Socioeconomic History  . Marital status: Married    Spouse name: Brett Pearson  . Number of children: 2  . Years of education: Not on file  . Highest education level: Master's degree (e.g., MA, MS, MEng, MEd, MSW, MBA)  Occupational History  . Occupation: Retired  Scientific laboratory technician  . Financial resource strain: Not hard at all  . Food insecurity:    Worry: Never true    Inability: Never true  . Transportation needs:    Medical: No    Non-medical: No  Tobacco  Use  . Smoking status: Former Smoker    Last attempt to quit: 06/15/1959    Years since quitting: 59.6  . Smokeless tobacco: Never Used  Substance and Sexual Activity  . Alcohol use: No    Alcohol/week: 0.0 standard drinks  . Drug use: No  . Sexual activity: Not Currently  Lifestyle  . Physical activity:    Days per week: 0 days    Minutes per session: 0 min  . Stress: Not at all  Relationships  . Social connections:    Talks on phone: More than three times a week    Gets together: Once a week    Attends religious service: More than 4 times per year    Active member of club or organization: Yes    Attends meetings of clubs or organizations: More than 4 times per year    Relationship status: Married  Other Topics Concern  . Not on file  Social History Narrative   Patient lives 6 months here and 6 months in [place] of Daily Living In your present state of health, do you have any difficulty performing the following activities: 01/07/2019  Hearing? N  Vision? N  Difficulty concentrating or making decisions? N  Walking or climbing stairs? N  Dressing or bathing? N  Doing errands, shopping? N  Preparing Food and eating ? N  Using the Toilet? N  In the past six months, have you accidently leaked urine? N  Do you have problems with loss of bowel control? N  Managing your Medications? N  Managing your Finances? N  Housekeeping or managing your Housekeeping? N  Some recent data might be hidden    Patient Literacy How often do you need to have someone help you when you read instructions, pamphlets, or other written materials from your doctor or pharmacy?: 1 - Never  Exercise Current Exercise Habits: The patient does not participate in regular exercise at present, Exercise limited by: orthopedic condition(s)  Diet Patient reports consuming 3 meals a day and 0 snack(s) a day Patient reports that his primary diet is: Regular Patient reports that she  does have regular access to food.   Depression Screen PHQ 2/9 Scores 01/07/2019 12/06/2017 03/17/2015 03/11/2014  PHQ - 2 Score 0 0 0 0     Fall Risk Fall Risk  01/07/2019 12/06/2017 03/17/2015 03/11/2014  Falls in the past year? 1 No Yes No  Number falls in past yr: 0 - 1 -  Injury with Fall? 0 - No -  Risk for fall due to : - - Impaired balance/gait;Impaired mobility -  Risk for fall due to: Comment - - has prosthesis on right legs -  Follow up - - Falls prevention discussed -     Objective:  Brett Pearson. seemed alert  and oriented and he participated appropriately during our telephone visit.  Blood Pressure Weight BMI  BP Readings from Last 3 Encounters:  08/07/18 140/76  12/06/17 117/69  03/21/17 118/60   Wt Readings from Last 3 Encounters:  01/07/19 214 lb (97.1 kg)  08/07/18 214 lb 9.6 oz (97.3 kg)  12/06/17 217 lb 12.8 oz (98.8 kg)   BMI Readings from Last 1 Encounters:  01/07/19 28.23 kg/m    *Unable to obtain current vital signs, weight, and BMI due to telephone visit type  Hearing/Vision  . Dean did not seem to have difficulty with hearing/understanding during the telephone conversation . Reports that he has had a formal eye exam by an eye care professional within the past year . Reports that he has not had a formal hearing evaluation within the past year *Unable to fully assess hearing and vision during telephone visit type  Cognitive Function: 6CIT Screen 01/07/2019  What Year? 0 points  What month? 0 points  What time? 0 points  Count back from 20 0 points  Months in reverse 0 points  Repeat phrase 0 points  Total Score 0    Normal Cognitive Function Screening: Yes (Normal:0-7, Significant for Dysfunction: >8)  Immunization & Health Maintenance Record Immunization History  Administered Date(s) Administered  . Hep A / Hep B 03/13/2017, 04/15/2017, 10/14/2017  . Influenza, High Dose Seasonal PF 05/14/2014, 06/06/2015, 05/09/2016, 05/12/2017, 05/12/2017,  05/03/2018, 05/03/2018  . Influenza-Unspecified 06/03/2005, 06/06/2012, 04/26/2015, 06/08/2015, 05/06/2016, 05/21/2017  . Pneumococcal Conjugate-13 02/09/2015  . Pneumococcal Polysaccharide-23 05/29/2006  . Td 11/15/2004  . Tdap 06/18/2011  . Zoster 08/06/2008  . Zoster Recombinat (Shingrix) 04/17/2018, 04/17/2018    Health Maintenance  Topic Date Due  . INFLUENZA VACCINE  03/07/2019  . TETANUS/TDAP  06/17/2021  . PNA vac Low Risk Adult  Completed       Assessment  This is a routine wellness examination for Brett Pearson.Marland Kitchen  Health Maintenance: Due or Overdue There are no preventive care reminders to display for this patient.  Brett Pearson. does not need a referral for Community Assistance: Care Management:   no Social Work:    no Prescription Assistance:  no Nutrition/Diabetes Education:  no   Plan:  Personalized Goals Goals Addressed            This Visit's Progress   . Exercise 3x per week (30 min per time)        Personalized Health Maintenance & Screening Recommendations  Up to date  Lung Cancer Screening Recommended: no (Low Dose CT Chest recommended if Age 4-80 years, 30 pack-year currently smoking OR have quit w/in past 15 years) Hepatitis C Screening recommended: no HIV Screening recommended: no  Advanced Directives: Written information was not prepared per patient's request.  Referrals & Orders No orders of the defined types were placed in this encounter.   Follow-up Plan . Follow-up with Brett Pearson as planned    I have personally reviewed and noted the following in the patient's chart:   . Medical and social history . Use of alcohol, tobacco or illicit drugs  . Current medications and supplements . Functional ability and status . Nutritional status . Physical activity . Advanced directives . List of other physicians . Hospitalizations, surgeries, and ER visits in previous 12 months . Vitals . Screenings to include  cognitive, depression, and falls . Referrals and appointments  In addition, I have reviewed and discussed with Brett Pearson. certain preventive protocols,  quality metrics, and best practice recommendations. A written personalized care plan for preventive services as well as general preventive health recommendations is available and can be mailed to the patient at his request.      Wardell Heath  01/07/2019

## 2019-01-07 NOTE — Patient Instructions (Signed)
  Mr. Kabat , Thank you for taking time to come for your Medicare Wellness Visit. I appreciate your ongoing commitment to your health goals. Please review the following plan we discussed and let me know if I can assist you in the future.   These are the goals we discussed: Goals    . Exercise 3x per week (30 min per time)       This is a list of the screening recommended for you and due dates:  Health Maintenance  Topic Date Due  . Flu Shot  03/07/2019  . Tetanus Vaccine  06/17/2021  . Pneumonia vaccines  Completed

## 2019-01-07 NOTE — Patient Instructions (Signed)
Description    6mg  every day except for Tuesdays and Saturdays take 4mg   INR today is 2.9  (goal is 2-3) Perfect reading!  Eat a high vitamin food 1-2 times every week

## 2019-01-20 ENCOUNTER — Encounter: Payer: Self-pay | Admitting: Family Medicine

## 2019-01-20 ENCOUNTER — Ambulatory Visit (INDEPENDENT_AMBULATORY_CARE_PROVIDER_SITE_OTHER): Payer: PPO | Admitting: Family Medicine

## 2019-01-20 ENCOUNTER — Other Ambulatory Visit: Payer: Self-pay

## 2019-01-20 VITALS — BP 130/72 | HR 63 | Temp 96.9°F | Ht 73.0 in | Wt 215.0 lb

## 2019-01-20 DIAGNOSIS — I482 Chronic atrial fibrillation, unspecified: Secondary | ICD-10-CM

## 2019-01-20 DIAGNOSIS — R040 Epistaxis: Secondary | ICD-10-CM | POA: Diagnosis not present

## 2019-01-20 LAB — COAGUCHEK XS/INR WAIVED
INR: 3.4 — ABNORMAL HIGH (ref 0.9–1.1)
Prothrombin Time: 40.3 s

## 2019-01-20 LAB — POCT INR: INR: 3.4 — AB (ref 2.0–3.0)

## 2019-01-20 NOTE — Progress Notes (Signed)
Subjective:  Patient ID: Brett Pearson., male    DOB: 1941-03-29  Age: 78 y.o. MRN: [id number]  CC: nose bleeds   HPI Brett Pearson. presents for nose bleeds. Taking coumadin  Depression screen [facility] 2/9 01/07/2019 12/06/2017 03/17/2015  Decreased Interest 0 0 0  Down, Depressed, Hopeless 0 0 0  PHQ - 2 Score 0 0 0    History Brett Pearson has a past medical history of Atrial fibrillation ([place]), Edema, Hypokalemia, Neuropathy, S/P BKA (below knee amputation) ([place]) (2009), and Venous insufficiency.   He has no past surgical history on file.   His family history is not on file.He reports that he quit smoking about 59 years ago. He has never used smokeless tobacco. He reports that he does not drink alcohol or use drugs.    ROS Review of Systems  Constitutional: Negative for fever.  HENT: Positive for nosebleeds. Negative for congestion.   Respiratory: Negative for shortness of breath.   Cardiovascular: Negative for chest pain.  Musculoskeletal: Negative for arthralgias.  Skin: Negative for rash.    Objective:  BP 130/72   Pulse 63   Temp (!) 96.9 F (36.1 C) (Oral)   Ht 6\' 1"  (1.854 m)   Wt 215 lb (97.5 kg)   BMI 28.37 kg/m   BP Readings from Last 3 Encounters:  01/20/19 130/72  08/07/18 140/76  12/06/17 117/69    Wt Readings from Last 3 Encounters:  01/20/19 215 lb (97.5 kg)  01/07/19 214 lb (97.1 kg)  08/07/18 214 lb 9.6 oz (97.3 kg)     Physical Exam    Assessment & Plan:   Brett Pearson was seen today for nose bleeds.  Diagnoses and all orders for this visit:  Frequent nosebleeds -     CoaguChek XS/INR Waived -     Ambulatory referral to ENT  Chronic atrial fibrillation  Other orders -     POCT INR       I am having Brett Pearson. Brett Pearson. maintain his Cyanocobalamin (VITAMIN B-12 PO), multivitamin, DULoxetine, metoprolol succinate, cholecalciferol, Magnesium Gluconate, gabapentin, potassium chloride, fluticasone, and warfarin.  Allergies as of 01/20/2019       Reactions   Amoxicillin    Other reaction(s): HIVES      Medication List       Accurate as of January 20, 2019  3:32 PM. If you have any questions, ask your nurse or doctor.        cholecalciferol 1000 units tablet Commonly known as: VITAMIN D Take 1,000 Units by mouth daily.   DULoxetine 60 MG capsule Commonly known as: CYMBALTA Take 60 mg by mouth daily.   fluticasone 50 MCG/ACT nasal spray Commonly known as: FLONASE   gabapentin 300 MG capsule Commonly known as: NEURONTIN Take 4 capsules by mouth 3 (three) times daily.   Magonate 500 (27 Mg) MG Tabs Generic drug: Magnesium Gluconate Take 500 mg by mouth.   metoprolol succinate 25 MG 24 hr tablet Commonly known as: TOPROL-XL Take 25 mg by mouth daily.   multivitamin tablet Take 1 tablet by mouth daily.   potassium chloride 10 MEQ CR capsule Commonly known as: MICRO-K Take 1 capsule by mouth 3 (three) times daily.   VITAMIN B-12 PO Take by mouth daily.   warfarin 2 MG tablet Commonly known as: COUMADIN Take as directed by the anticoagulation clinic. If you are unsure how to take this medication, talk to your nurse or doctor. Original instructions: TAKE 2 OR 3 TABLETS  BY MOUTH DAILY AS RECOMMENDED BY COUMADIN CLINIC.               Follow-up: Return in about 1 week (around 01/27/2019).  Brett Pearson, M.D.

## 2019-01-22 DIAGNOSIS — I872 Venous insufficiency (chronic) (peripheral): Secondary | ICD-10-CM | POA: Diagnosis not present

## 2019-01-22 DIAGNOSIS — Z89519 Acquired absence of unspecified leg below knee: Secondary | ICD-10-CM | POA: Diagnosis not present

## 2019-01-22 DIAGNOSIS — R269 Unspecified abnormalities of gait and mobility: Secondary | ICD-10-CM | POA: Diagnosis not present

## 2019-01-27 DIAGNOSIS — Z7901 Long term (current) use of anticoagulants: Secondary | ICD-10-CM | POA: Diagnosis not present

## 2019-01-27 DIAGNOSIS — R040 Epistaxis: Secondary | ICD-10-CM | POA: Insufficient documentation

## 2019-02-18 DIAGNOSIS — G546 Phantom limb syndrome with pain: Secondary | ICD-10-CM | POA: Diagnosis not present

## 2019-02-18 DIAGNOSIS — I10 Essential (primary) hypertension: Secondary | ICD-10-CM | POA: Diagnosis not present

## 2019-02-18 DIAGNOSIS — E559 Vitamin D deficiency, unspecified: Secondary | ICD-10-CM | POA: Diagnosis not present

## 2019-02-18 DIAGNOSIS — Z89511 Acquired absence of right leg below knee: Secondary | ICD-10-CM | POA: Diagnosis not present

## 2019-02-18 DIAGNOSIS — E538 Deficiency of other specified B group vitamins: Secondary | ICD-10-CM | POA: Diagnosis not present

## 2019-02-18 DIAGNOSIS — K7469 Other cirrhosis of liver: Secondary | ICD-10-CM | POA: Diagnosis not present

## 2019-02-18 DIAGNOSIS — Z23 Encounter for immunization: Secondary | ICD-10-CM | POA: Diagnosis not present

## 2019-02-18 DIAGNOSIS — I77810 Thoracic aortic ectasia: Secondary | ICD-10-CM | POA: Diagnosis not present

## 2019-02-18 DIAGNOSIS — I482 Chronic atrial fibrillation, unspecified: Secondary | ICD-10-CM | POA: Diagnosis not present

## 2019-02-24 ENCOUNTER — Other Ambulatory Visit: Payer: Self-pay

## 2019-02-25 ENCOUNTER — Encounter: Payer: Self-pay | Admitting: Pharmacist Clinician (PhC)/ Clinical Pharmacy Specialist

## 2019-02-25 ENCOUNTER — Encounter: Payer: Self-pay | Admitting: Family Medicine

## 2019-02-25 ENCOUNTER — Ambulatory Visit (INDEPENDENT_AMBULATORY_CARE_PROVIDER_SITE_OTHER): Payer: PPO | Admitting: Family Medicine

## 2019-02-25 VITALS — BP 108/67 | HR 68 | Temp 98.2°F | Ht 73.0 in | Wt 214.8 lb

## 2019-02-25 DIAGNOSIS — I482 Chronic atrial fibrillation, unspecified: Secondary | ICD-10-CM | POA: Diagnosis not present

## 2019-02-25 NOTE — Progress Notes (Signed)
BP 108/67   Pulse 68   Temp 98.2 F (36.8 C) (Temporal)   Ht 6\' 1"  (1.854 m)   Wt 214 lb 12.8 oz (97.4 kg)   BMI 28.34 kg/m    Subjective:   Patient ID: Brett Pearson., male    DOB: August 19, 1940, 78 y.o.   MRN: [id number]  HPI: Cordarious Zeek. is a 78 y.o. male presenting on 02/25/2019 for Coagulation Disorder   HPI Patient is coming in for A. fib and Coumadin recheck today.  He denies any major bruising or bleeding.  He is coming to me from another provider and establish care with me in the office.  Patient has had the A. fib for some time and is stable with it.  Relevant past medical, surgical, family and social history reviewed and updated as indicated. Interim medical history since our last visit reviewed. Allergies and medications reviewed and updated.  Review of Systems  Constitutional: Negative for chills and fever.  Respiratory: Negative for shortness of breath and wheezing.   Cardiovascular: Negative for chest pain, palpitations and leg swelling.  Musculoskeletal: Negative for back pain and gait problem.  Skin: Negative for rash.  Neurological: Negative for dizziness.  All other systems reviewed and are negative.   Per HPI unless specifically indicated above   Allergies as of 02/25/2019      Reactions   Amoxicillin    Other reaction(s): HIVES      Medication List       Accurate as of February 25, 2019 11:48 AM. If you have any questions, ask your nurse or doctor.        cholecalciferol 1000 units tablet Commonly known as: VITAMIN D Take 1,000 Units by mouth daily.   DULoxetine 60 MG capsule Commonly known as: CYMBALTA Take 60 mg by mouth daily.   fluticasone 50 MCG/ACT nasal spray Commonly known as: FLONASE   gabapentin 300 MG capsule Commonly known as: NEURONTIN Take 4 capsules by mouth 3 (three) times daily.   Magonate 500 (27 Mg) MG Tabs Generic drug: Magnesium Gluconate Take 500 mg by mouth.   metoprolol succinate 25 MG 24 hr tablet  Commonly known as: TOPROL-XL Take 25 mg by mouth daily.   multivitamin tablet Take 1 tablet by mouth daily.   potassium chloride 10 MEQ CR capsule Commonly known as: MICRO-K Take 1 capsule by mouth 3 (three) times daily.   VITAMIN B-12 PO Take by mouth daily.   warfarin 2 MG tablet Commonly known as: COUMADIN Take as directed by the anticoagulation clinic. If you are unsure how to take this medication, talk to your nurse or doctor. Original instructions: TAKE 2 OR 3 TABLETS BY MOUTH DAILY AS RECOMMENDED BY COUMADIN CLINIC.        Objective:   BP 108/67   Pulse 68   Temp 98.2 F (36.8 C) (Temporal)   Ht 6\' 1"  (1.854 m)   Wt 214 lb 12.8 oz (97.4 kg)   BMI 28.34 kg/m   Wt Readings from Last 3 Encounters:  02/25/19 214 lb 12.8 oz (97.4 kg)  01/20/19 215 lb (97.5 kg)  01/07/19 214 lb (97.1 kg)    Physical Exam Vitals signs and nursing note reviewed.  Constitutional:      General: He is not in acute distress.    Appearance: He is well-developed. He is not diaphoretic.  Eyes:     General: No scleral icterus.    Conjunctiva/sclera: Conjunctivae normal.  Neck:  Musculoskeletal: Neck supple.     Thyroid: No thyromegaly.  Cardiovascular:     Rate and Rhythm: Normal rate. Rhythm irregular.     Heart sounds: Normal heart sounds.  Pulmonary:     Effort: Pulmonary effort is normal. No respiratory distress.     Breath sounds: Normal breath sounds. No wheezing.  Lymphadenopathy:     Cervical: No cervical adenopathy.  Neurological:     Mental Status: He is alert.     Coordination: Coordination normal.  Psychiatric:        Behavior: Behavior normal.     Description   Continue current dose of 6 mg on Tuesdays and Saturdays and 4 mg the rest of the week  INR 2.0 (goal 2.0-3.0) perfect reading  Recheck in 4 to 6 weeks     Assessment & Plan:   Problem List Items Addressed This Visit      Cardiovascular and Mediastinum   Chronic atrial fibrillation - Primary    Relevant Orders   Protime-INR (Completed)       Follow up plan: Return in about 6 weeks (around 04/08/2019), or if symptoms worsen or fail to improve, for Coumadin recheck.  Counseling provided for all of the vaccine components Orders Placed This Encounter  Procedures  . Pukwana Jhada Risk, MD [facility] 02/25/2019, 11:48 AM

## 2019-02-26 LAB — PROTIME-INR
INR: 2 — ABNORMAL HIGH (ref 0.8–1.2)
Prothrombin Time: 20.9 s — ABNORMAL HIGH (ref 9.1–12.0)

## 2019-03-02 ENCOUNTER — Telehealth: Payer: Self-pay

## 2019-03-02 NOTE — Telephone Encounter (Signed)
-----   Message from Worthy Rancher, MD sent at 03/01/2019 10:24 PM EDT ----- For some reason I do not see a result note and it is not in my result outbox, please send a copy of his AVS to him, his INR was good and continue current dose

## 2019-03-02 NOTE — Telephone Encounter (Signed)
Attempted to contact patient - LMTCB Letter sent

## 2019-03-25 ENCOUNTER — Telehealth: Payer: Self-pay | Admitting: Family Medicine

## 2019-03-25 ENCOUNTER — Other Ambulatory Visit: Payer: Self-pay

## 2019-03-26 ENCOUNTER — Encounter: Payer: Self-pay | Admitting: Family Medicine

## 2019-03-26 ENCOUNTER — Ambulatory Visit (INDEPENDENT_AMBULATORY_CARE_PROVIDER_SITE_OTHER): Payer: PPO | Admitting: Family Medicine

## 2019-03-26 VITALS — BP 109/69 | HR 76 | Temp 96.9°F | Ht 73.0 in | Wt 213.0 lb

## 2019-03-26 DIAGNOSIS — I482 Chronic atrial fibrillation, unspecified: Secondary | ICD-10-CM | POA: Diagnosis not present

## 2019-03-26 LAB — COAGUCHEK XS/INR WAIVED
INR: 3.9 — ABNORMAL HIGH (ref 0.9–1.1)
Prothrombin Time: 46.2 s

## 2019-03-26 NOTE — Progress Notes (Signed)
BP 109/69   Pulse 76   Temp (!) 96.9 F (36.1 C) (Temporal)   Ht 6\' 1"  (1.854 m)   Wt 213 lb (96.6 kg)   BMI 28.10 kg/m    Subjective:   Patient ID: Brett Pearson., male    DOB: 03-Jun-1941, 78 y.o.   MRN: [id number]  HPI: Brett Pearson. is a 78 y.o. male presenting on 03/26/2019 for Coagulation Disorder   HPI Is coming in for Coumadin and anticoagulation recheck today.  He is a little thin today.  He denies any bruising or bleeding or chest pain or palpitations.  He does not know why he is also on high today.  Relevant past medical, surgical, family and social history reviewed and updated as indicated. Interim medical history since our last visit reviewed. Allergies and medications reviewed and updated.  Review of Systems  Constitutional: Negative for chills and fever.  Eyes: Negative for visual disturbance.  Respiratory: Negative for shortness of breath and wheezing.   Cardiovascular: Negative for chest pain and leg swelling.  Musculoskeletal: Negative for back pain and gait problem.  Skin: Negative for rash.  Neurological: Negative for dizziness, weakness and numbness.  All other systems reviewed and are negative.   Per HPI unless specifically indicated above   Allergies as of 03/26/2019      Reactions   Amoxicillin    Other reaction(s): HIVES      Medication List       Accurate as of March 26, 2019 12:04 PM. If you have any questions, ask your nurse or doctor.        cholecalciferol 1000 units tablet Commonly known as: VITAMIN D Take 1,000 Units by mouth daily.   DULoxetine 60 MG capsule Commonly known as: CYMBALTA Take 60 mg by mouth daily.   fluticasone 50 MCG/ACT nasal spray Commonly known as: FLONASE   gabapentin 300 MG capsule Commonly known as: NEURONTIN Take 4 capsules by mouth 3 (three) times daily.   Magonate 500 (27 Mg) MG Tabs Generic drug: Magnesium Gluconate Take 500 mg by mouth.   metoprolol succinate 25 MG 24 hr tablet  Commonly known as: TOPROL-XL Take 25 mg by mouth daily.   multivitamin tablet Take 1 tablet by mouth daily.   potassium chloride 10 MEQ CR capsule Commonly known as: MICRO-K Take 1 capsule by mouth 3 (three) times daily.   VITAMIN B-12 PO Take by mouth daily.   warfarin 2 MG tablet Commonly known as: COUMADIN Take as directed by the anticoagulation clinic. If you are unsure how to take this medication, talk to your nurse or doctor. Original instructions: TAKE 2 OR 3 TABLETS BY MOUTH DAILY AS RECOMMENDED BY COUMADIN CLINIC.        Objective:   BP 109/69   Pulse 76   Temp (!) 96.9 F (36.1 C) (Temporal)   Ht 6\' 1"  (1.854 m)   Wt 213 lb (96.6 kg)   BMI 28.10 kg/m   Wt Readings from Last 3 Encounters:  03/26/19 213 lb (96.6 kg)  02/25/19 214 lb 12.8 oz (97.4 kg)  01/20/19 215 lb (97.5 kg)    Physical Exam Vitals signs and nursing note reviewed.  Constitutional:      General: He is not in acute distress.    Appearance: He is well-developed. He is not diaphoretic.  Eyes:     General: No scleral icterus.    Conjunctiva/sclera: Conjunctivae normal.  Neck:     Thyroid: No thyromegaly.  Skin:  General: Skin is warm and dry.     Findings: No rash.  Neurological:     Mental Status: He is alert and oriented to person, place, and time.     Coordination: Coordination normal.  Psychiatric:        Behavior: Behavior normal.     Description   Continue current dose of 6 mg on Tuesdays and Saturdays and 4 mg the rest of the week  INR 3.9 (goal 2.0-3.0) thin Return in 2 weeks for recheck  Recheck in 4 to 6 weeks      Assessment & Plan:   Problem List Items Addressed This Visit      Cardiovascular and Mediastinum   Chronic atrial fibrillation - Primary   Relevant Orders   CoaguChek XS/INR Waived       Follow up plan: Return in about 2 weeks (around 04/09/2019), or if symptoms worsen or fail to improve, for INR and Coumadin recheck.  Counseling provided  for all of the vaccine components Orders Placed This Encounter  Procedures  . CoaguChek XS/INR [place], MD [facility] 03/26/2019, 12:04 PM

## 2019-04-08 ENCOUNTER — Other Ambulatory Visit: Payer: Self-pay

## 2019-04-09 ENCOUNTER — Telehealth: Payer: Self-pay | Admitting: Family Medicine

## 2019-04-09 ENCOUNTER — Encounter: Payer: Self-pay | Admitting: Family

## 2019-04-09 ENCOUNTER — Ambulatory Visit (INDEPENDENT_AMBULATORY_CARE_PROVIDER_SITE_OTHER): Payer: PPO | Admitting: Family

## 2019-04-09 ENCOUNTER — Other Ambulatory Visit: Payer: Self-pay | Admitting: *Deleted

## 2019-04-09 VITALS — BP 131/70 | HR 60 | Temp 98.0°F | Ht 71.0 in | Wt 213.4 lb

## 2019-04-09 DIAGNOSIS — I482 Chronic atrial fibrillation, unspecified: Secondary | ICD-10-CM | POA: Diagnosis not present

## 2019-04-09 DIAGNOSIS — R5383 Other fatigue: Secondary | ICD-10-CM

## 2019-04-09 DIAGNOSIS — I10 Essential (primary) hypertension: Secondary | ICD-10-CM | POA: Diagnosis not present

## 2019-04-09 DIAGNOSIS — R231 Pallor: Secondary | ICD-10-CM | POA: Diagnosis not present

## 2019-04-09 DIAGNOSIS — Z7901 Long term (current) use of anticoagulants: Secondary | ICD-10-CM | POA: Diagnosis not present

## 2019-04-09 LAB — COAGUCHEK XS/INR WAIVED
INR: 2.6 — ABNORMAL HIGH (ref 0.9–1.1)
Prothrombin Time: 30.6 s

## 2019-04-09 MED ORDER — WARFARIN SODIUM 2 MG PO TABS: ORAL_TABLET | ORAL | 0 refills | Status: DC

## 2019-04-09 NOTE — Progress Notes (Signed)
Subjective:    Patient ID: Brett Duffel., male    DOB: 12-23-1940, 77 y.o.   MRN: [id number]  Chief Complaint  Patient presents with  . protime recheck    PT presents to the office today for INR recheck. He is currently taking warfarin for A Fib. He states this is stable. He denies any bleeding, bruising, bleeding. Missed doses, extra doses, or changes in his medications.  Pt is pale today, but denies any bleeding.  Hypertension This is a chronic problem. The current episode started more than 1 year ago. The problem has been resolved since onset. The problem is controlled. Associated symptoms include malaise/fatigue. Pertinent negatives include no peripheral edema. The current treatment provides moderate improvement.     Review of Systems  Constitutional: Positive for malaise/fatigue.  All other systems reviewed and are negative.      Objective:   Physical Exam Vitals signs reviewed.  Constitutional:      General: He is not in acute distress.    Appearance: He is well-developed.  HENT:     Head: Normocephalic.     Right Ear: External ear normal.     Left Ear: External ear normal.  Eyes:     General:        Right eye: No discharge.        Left eye: No discharge.     Pupils: Pupils are equal, round, and reactive to light.  Neck:     Musculoskeletal: Normal range of motion and neck supple.     Thyroid: No thyromegaly.  Cardiovascular:     Rate and Rhythm: Normal rate and regular rhythm.     Heart sounds: Normal heart sounds. No murmur.  Pulmonary:     Effort: Pulmonary effort is normal. No respiratory distress.     Breath sounds: Normal breath sounds. No wheezing.  Abdominal:     General: Bowel sounds are normal. There is no distension.     Palpations: Abdomen is soft.     Tenderness: There is no abdominal tenderness.  Musculoskeletal:        General: No tenderness.     Comments: Right BKA  Skin:    General: Skin is warm and dry.     Coloration: Skin is pale.      Findings: No erythema or rash.  Neurological:     Mental Status: He is alert and oriented to person, place, and time.     Cranial Nerves: No cranial nerve deficit.     Deep Tendon Reflexes: Reflexes are normal and symmetric.  Psychiatric:        Behavior: Behavior normal.        Thought Content: Thought content normal.        Judgment: Judgment normal.       BP 131/70   Pulse 60   Temp 98 F (36.7 C) (Temporal)   Ht 5' 11"  (1.803 m)   Wt 213 lb 6.4 oz (96.8 kg)   BMI 29.76 kg/m      Assessment & Plan:  Brett Duffel. comes in today with chief complaint of protime recheck   Diagnosis and orders addressed:  1. Chronic atrial fibrillation Description   Continue current dose of 6 mg on Tuesdays and Saturdays and 4 mg the rest of the week  INR 2.6  (goal 2.0-3.0) At goal!   Recheck in 4 to 6 weeks     - warfarin (COUMADIN) 2 MG tablet; 4 mg every day except  Saturday 6 mg  Dispense: 270 tablet; Refill: 0 - Anemia Profile B - CMP14+EGFR  2. Essential hypertension - Anemia Profile B - CMP14+EGFR  3. Pale - Anemia Profile B - CMP14+EGFR  4. Fatigue, unspecified type - Anemia Profile B - CMP14+EGFR  5. Chronic anticoagulation - warfarin (COUMADIN) 2 MG tablet; 4 mg every day except Saturday 6 mg  Dispense: 270 tablet; Refill: 0   Labs pending Health Maintenance reviewed Diet and exercise encouraged  Follow up plan: 4 weeks    Evelina Dun, FNP

## 2019-04-09 NOTE — Telephone Encounter (Signed)
Appointment scheduled 05/07/19 at 11:25 am patient aware

## 2019-04-09 NOTE — Addendum Note (Signed)
Addended by: Liliane Bade on: 04/09/2019 01:20 PM   Modules accepted: Orders

## 2019-04-09 NOTE — Patient Instructions (Signed)
Description   Continue current dose of 6 mg on Tuesdays and Saturdays and 4 mg the rest of the week  INR 2.6  (goal 2.0-3.0) At goal!   Recheck in 4 to 6 weeks

## 2019-04-10 LAB — ANEMIA PROFILE B
Basophils Absolute: 0 10*3/uL (ref 0.0–0.2)
Basos: 0 %
EOS (ABSOLUTE): 0.4 10*3/uL (ref 0.0–0.4)
Eos: 5 %
Ferritin: 247 ng/mL (ref 30–400)
Folate: 18.7 ng/mL (ref >3.0)
Hematocrit: 39.5 % (ref 37.5–51.0)
Hemoglobin: 13.3 g/dL (ref 13.0–17.7)
Immature Grans (Abs): 0 10*3/uL (ref 0.0–0.1)
Immature Granulocytes: 0 %
Iron Saturation: 34 % (ref 15–55)
Iron: 77 ug/dL (ref 38–169)
Lymphocytes Absolute: 1.8 10*3/uL (ref 0.7–3.1)
Lymphs: 25 %
MCH: 33 pg (ref 26.6–33.0)
MCHC: 33.7 g/dL (ref 31.5–35.7)
MCV: 98 fL — ABNORMAL HIGH (ref 79–97)
Monocytes Absolute: 0.8 10*3/uL (ref 0.1–0.9)
Monocytes: 11 %
Neutrophils Absolute: 4.2 10*3/uL (ref 1.4–7.0)
Neutrophils: 59 %
Platelets: 203 10*3/uL (ref 150–450)
RBC: 4.03 x10E6/uL — ABNORMAL LOW (ref 4.14–5.80)
RDW: 12.3 % (ref 11.6–15.4)
Retic Ct Pct: 1.2 % (ref 0.6–2.6)
Total Iron Binding Capacity: 224 ug/dL — ABNORMAL LOW (ref 250–450)
UIBC: 147 ug/dL (ref 111–343)
Vitamin B-12: 977 pg/mL (ref 232–1245)
WBC: 7.2 10*3/uL (ref 3.4–10.8)

## 2019-04-10 LAB — CMP14+EGFR
ALT: 11 IU/L (ref 0–44)
AST: 22 IU/L (ref 0–40)
Albumin/Globulin Ratio: 1.4 (ref 1.2–2.2)
Albumin: 4 g/dL (ref 3.7–4.7)
Alkaline Phosphatase: 74 IU/L (ref 39–117)
BUN/Creatinine Ratio: 20 (ref 10–24)
BUN: 20 mg/dL (ref 8–27)
Bilirubin Total: 0.8 mg/dL (ref 0.0–1.2)
CO2: 23 mmol/L (ref 20–29)
Calcium: 9.4 mg/dL (ref 8.6–10.2)
Chloride: 101 mmol/L (ref 96–106)
Creatinine, Ser: 1.01 mg/dL (ref 0.76–1.27)
GFR calc Af Amer: 82 mL/min/{1.73_m2} (ref >59)
GFR calc non Af Amer: 71 mL/min/{1.73_m2} (ref >59)
Globulin, Total: 2.8 g/dL (ref 1.5–4.5)
Glucose: 82 mg/dL (ref 65–99)
Potassium: 4.6 mmol/L (ref 3.5–5.2)
Sodium: 140 mmol/L (ref 134–144)
Total Protein: 6.8 g/dL (ref 6.0–8.5)

## 2019-04-14 DIAGNOSIS — Z89511 Acquired absence of right leg below knee: Secondary | ICD-10-CM | POA: Diagnosis not present

## 2019-05-07 ENCOUNTER — Other Ambulatory Visit: Payer: Self-pay

## 2019-05-07 ENCOUNTER — Encounter: Payer: Self-pay | Admitting: Family Medicine

## 2019-05-07 ENCOUNTER — Ambulatory Visit (INDEPENDENT_AMBULATORY_CARE_PROVIDER_SITE_OTHER): Payer: PPO | Admitting: Family Medicine

## 2019-05-07 VITALS — BP 116/68 | HR 78 | Temp 96.9°F | Ht 71.0 in | Wt 214.4 lb

## 2019-05-07 DIAGNOSIS — J849 Interstitial pulmonary disease, unspecified: Secondary | ICD-10-CM | POA: Diagnosis not present

## 2019-05-07 DIAGNOSIS — J479 Bronchiectasis, uncomplicated: Secondary | ICD-10-CM | POA: Diagnosis not present

## 2019-05-07 DIAGNOSIS — I482 Chronic atrial fibrillation, unspecified: Secondary | ICD-10-CM

## 2019-05-07 DIAGNOSIS — Z87891 Personal history of nicotine dependence: Secondary | ICD-10-CM | POA: Diagnosis not present

## 2019-05-07 LAB — COAGUCHEK XS/INR WAIVED
INR: 2.4 — ABNORMAL HIGH (ref 0.9–1.1)
Prothrombin Time: 28.9 s

## 2019-05-07 NOTE — Progress Notes (Signed)
BP 116/68   Pulse 78   Temp (!) 96.9 F (36.1 C) (Temporal)   Ht 5\' 11"  (1.803 m)   Wt 214 lb 6.4 oz (97.3 kg)   SpO2 96%   BMI 29.90 kg/m    Subjective:   Patient ID: Brett Duffel., male    DOB: 04-21-1941, 78 y.o.   MRN: [id number]  HPI: Brett Nordmann. is a 78 y.o. male presenting on 05/07/2019 for Coagulation Disorder   HPI Patient is coming in for Coumadin anticoagulation recheck. Patient denies any bruising or bleeding or chest pain or palpitations.  Patient has chronic A. fib and that is why he is on the Coumadin.  Relevant past medical, surgical, family and social history reviewed and updated as indicated. Interim medical history since our last visit reviewed. Allergies and medications reviewed and updated.  Review of Systems  Constitutional: Negative for chills and fever.  Respiratory: Negative for shortness of breath and wheezing.   Cardiovascular: Negative for chest pain and leg swelling.  Gastrointestinal: Negative for blood in stool.  Genitourinary: Negative for hematuria.  Musculoskeletal: Negative for back pain and gait problem.  Skin: Negative for rash.  All other systems reviewed and are negative.   Per HPI unless specifically indicated above   Allergies as of 05/07/2019      Reactions   Amoxicillin    Other reaction(s): HIVES      Medication List       Accurate as of May 07, 2019 11:39 AM. If you have any questions, ask your nurse or doctor.        cholecalciferol 1000 units tablet Commonly known as: VITAMIN D Take 1,000 Units by mouth daily.   DULoxetine 60 MG capsule Commonly known as: CYMBALTA Take 60 mg by mouth daily.   fluticasone 50 MCG/ACT nasal spray Commonly known as: FLONASE   gabapentin 300 MG capsule Commonly known as: NEURONTIN Take 4 capsules by mouth 3 (three) times daily.   Magonate 500 (27 Mg) MG Tabs Generic drug: Magnesium Gluconate Take 500 mg by mouth.   metoprolol succinate 25 MG 24 hr tablet  Commonly known as: TOPROL-XL Take 25 mg by mouth daily.   multivitamin tablet Take 1 tablet by mouth daily.   potassium chloride 10 MEQ CR capsule Commonly known as: MICRO-K Take 1 capsule by mouth 3 (three) times daily.   VITAMIN B-12 PO Take by mouth daily.   warfarin 2 MG tablet Commonly known as: COUMADIN Take as directed by the anticoagulation clinic. If you are unsure how to take this medication, talk to your nurse or doctor. Original instructions: 4 mg every day except Saturday 6 mg        Objective:   BP 116/68   Pulse 78   Temp (!) 96.9 F (36.1 C) (Temporal)   Ht 5\' 11"  (1.803 m)   Wt 214 lb 6.4 oz (97.3 kg)   SpO2 96%   BMI 29.90 kg/m   Wt Readings from Last 3 Encounters:  05/07/19 214 lb 6.4 oz (97.3 kg)  04/09/19 213 lb 6.4 oz (96.8 kg)  03/26/19 213 lb (96.6 kg)    Physical Exam Vitals signs and nursing note reviewed.  Constitutional:      General: He is not in acute distress.    Appearance: He is well-developed. He is not diaphoretic.  Eyes:     General: No scleral icterus.    Conjunctiva/sclera: Conjunctivae normal.  Neck:     Thyroid: No thyromegaly.  Musculoskeletal: Normal range of motion.  Skin:    General: Skin is warm and dry.     Findings: No rash.  Neurological:     Mental Status: He is alert and oriented to person, place, and time.     Coordination: Coordination normal.  Psychiatric:        Behavior: Behavior normal.     Description   Continue current dose of 6 mg on Tuesdays and Saturdays and 4 mg the rest of the week  INR 2.4  (goal 2.0-3.0) At goal!   Recheck in 4 to 6 weeks      Assessment & Plan:   Problem List Items Addressed This Visit      Cardiovascular and Mediastinum   Chronic atrial fibrillation ([place]) - Primary   Relevant Orders   CoaguChek XS/INR Waived       Follow up plan: Return in about 6 weeks (around 06/18/2019), or if symptoms worsen or fail to improve, for Coumadin recheck.  Counseling  provided for all of the vaccine components Orders Placed This Encounter  Procedures  . CoaguChek XS/INR [place], MD [facility] 05/07/2019, 11:39 AM

## 2019-05-18 DIAGNOSIS — G894 Chronic pain syndrome: Secondary | ICD-10-CM | POA: Diagnosis not present

## 2019-05-18 DIAGNOSIS — J849 Interstitial pulmonary disease, unspecified: Secondary | ICD-10-CM | POA: Diagnosis not present

## 2019-05-18 DIAGNOSIS — I482 Chronic atrial fibrillation, unspecified: Secondary | ICD-10-CM | POA: Diagnosis not present

## 2019-05-18 DIAGNOSIS — J479 Bronchiectasis, uncomplicated: Secondary | ICD-10-CM | POA: Diagnosis not present

## 2019-06-01 ENCOUNTER — Other Ambulatory Visit: Payer: Self-pay

## 2019-06-01 ENCOUNTER — Ambulatory Visit (INDEPENDENT_AMBULATORY_CARE_PROVIDER_SITE_OTHER): Payer: PPO | Admitting: Family Medicine

## 2019-06-01 ENCOUNTER — Encounter: Payer: Self-pay | Admitting: Family Medicine

## 2019-06-01 VITALS — BP 112/60 | HR 68 | Temp 97.1°F | Resp 20 | Ht 71.0 in | Wt 215.0 lb

## 2019-06-01 DIAGNOSIS — H1132 Conjunctival hemorrhage, left eye: Secondary | ICD-10-CM | POA: Diagnosis not present

## 2019-06-01 DIAGNOSIS — Z7901 Long term (current) use of anticoagulants: Secondary | ICD-10-CM | POA: Diagnosis not present

## 2019-06-01 DIAGNOSIS — I482 Chronic atrial fibrillation, unspecified: Secondary | ICD-10-CM | POA: Diagnosis not present

## 2019-06-01 LAB — COAGUCHEK XS/INR WAIVED
INR: 2.3 — ABNORMAL HIGH (ref 0.9–1.1)
Prothrombin Time: 27.3 s

## 2019-06-01 NOTE — Progress Notes (Signed)
Chief Complaint  Patient presents with  . Anticoagulation    eye redness -concerned    HPI  Patient presents today for Patient in for follow-up of atrial fibrillation. Patient denies any recent bouts of chest pain or palpitations. Additionally, patient is taking anticoagulants. Patient reports blood in the eyes. Otherwise denies any recent excessive bleeding episodes including epistaxis, bleeding from the gums, genitalia, rectal bleeding or hematuria. Additionally there has been no excessive bruising.  PMH: Smoking status notedeyes ROS: Per HPI  Objective: BP 112/60   Pulse 68   Temp (!) 97.1 F (36.2 C)   Resp 20   Ht 5\' 11"  (1.803 m)   Wt 215 lb (97.5 kg)   SpO2 99%   BMI 29.99 kg/m  Gen: NAD, alert, cooperative with exam HEENT: NCAT, EOMI, PERRL. There is injection vs mild subconjunctival hemorrhage at the left lateral epicanthus. CV: RRR, good S1/S2, no murmur Resp: CTABL, no wheezes, non-labored Ext: No edema, warm Neuro: Alert and oriented, No gross deficits  Assessment and plan:  1. Chronic atrial fibrillation ([place])   2. Chronic anticoagulation   3. Subconjunctival hemorrhage of left eye     No orders of the defined types were placed in this encounter.   Orders Placed This Encounter  Procedures  . CoaguChek XS/INR Waived    Follow up as needed.  Claretta Fraise, MD

## 2019-06-11 DIAGNOSIS — R413 Other amnesia: Secondary | ICD-10-CM | POA: Diagnosis not present

## 2019-06-11 DIAGNOSIS — G894 Chronic pain syndrome: Secondary | ICD-10-CM | POA: Diagnosis not present

## 2019-06-11 DIAGNOSIS — G546 Phantom limb syndrome with pain: Secondary | ICD-10-CM | POA: Diagnosis not present

## 2019-06-15 DIAGNOSIS — H5203 Hypermetropia, bilateral: Secondary | ICD-10-CM | POA: Diagnosis not present

## 2019-06-15 DIAGNOSIS — H02834 Dermatochalasis of left upper eyelid: Secondary | ICD-10-CM | POA: Diagnosis not present

## 2019-06-15 DIAGNOSIS — H52203 Unspecified astigmatism, bilateral: Secondary | ICD-10-CM | POA: Diagnosis not present

## 2019-06-15 DIAGNOSIS — H524 Presbyopia: Secondary | ICD-10-CM | POA: Diagnosis not present

## 2019-06-15 DIAGNOSIS — H16223 Keratoconjunctivitis sicca, not specified as Sjogren's, bilateral: Secondary | ICD-10-CM | POA: Diagnosis not present

## 2019-06-15 DIAGNOSIS — H43813 Vitreous degeneration, bilateral: Secondary | ICD-10-CM | POA: Diagnosis not present

## 2019-06-15 DIAGNOSIS — Z961 Presence of intraocular lens: Secondary | ICD-10-CM | POA: Diagnosis not present

## 2019-06-15 DIAGNOSIS — H02831 Dermatochalasis of right upper eyelid: Secondary | ICD-10-CM | POA: Diagnosis not present

## 2019-06-26 ENCOUNTER — Ambulatory Visit: Payer: PPO | Admitting: Family Medicine

## 2019-06-30 ENCOUNTER — Other Ambulatory Visit: Payer: Self-pay | Admitting: Family

## 2019-06-30 DIAGNOSIS — I482 Chronic atrial fibrillation, unspecified: Secondary | ICD-10-CM

## 2019-06-30 DIAGNOSIS — Z7901 Long term (current) use of anticoagulants: Secondary | ICD-10-CM

## 2019-07-06 ENCOUNTER — Ambulatory Visit (INDEPENDENT_AMBULATORY_CARE_PROVIDER_SITE_OTHER): Payer: PPO | Admitting: Family Medicine

## 2019-07-06 ENCOUNTER — Encounter: Payer: Self-pay | Admitting: Family Medicine

## 2019-07-06 ENCOUNTER — Other Ambulatory Visit: Payer: Self-pay

## 2019-07-06 VITALS — BP 138/77 | HR 71 | Temp 96.2°F | Ht 71.0 in | Wt 218.2 lb

## 2019-07-06 DIAGNOSIS — I482 Chronic atrial fibrillation, unspecified: Secondary | ICD-10-CM | POA: Diagnosis not present

## 2019-07-06 DIAGNOSIS — Z7901 Long term (current) use of anticoagulants: Secondary | ICD-10-CM

## 2019-07-06 LAB — COAGUCHEK XS/INR WAIVED
INR: 2.1 — ABNORMAL HIGH (ref 0.9–1.1)
Prothrombin Time: 25.1 s

## 2019-07-06 NOTE — Progress Notes (Signed)
BP 138/77   Pulse 71   Temp (!) 96.2 F (35.7 C) (Temporal)   Ht 5\' 11"  (1.803 m)   Wt 218 lb 3.2 oz (99 kg)   SpO2 97%   BMI 30.43 kg/m    Subjective:   Patient ID: Brett Duffel., male    DOB: [birth year], 78 y.o.   MRN: [id number]  HPI: Brett Thul. is a 78 y.o. male presenting on 07/06/2019 for Coagulation Disorder   HPI Coumadin recheck Target goal: 2.0-3.0 Reason on anticoagulation: Chronic A. fib Patient denies any bruising or bleeding or chest pain or palpitations   Relevant past medical, surgical, family and social history reviewed and updated as indicated. Interim medical history since our last visit reviewed. Allergies and medications reviewed and updated.  Review of Systems  Constitutional: Negative for chills and fever.  Respiratory: Negative for shortness of breath and wheezing.   Cardiovascular: Negative for chest pain and leg swelling.  Gastrointestinal: Negative for blood in stool.  Genitourinary: Negative for hematuria.  Musculoskeletal: Negative for back pain and gait problem.  Skin: Negative for rash.  All other systems reviewed and are negative.   Per HPI unless specifically indicated above   Allergies as of 07/06/2019      Reactions   Amoxicillin    Other reaction(s): HIVES      Medication List       Accurate as of July 06, 2019 10:34 AM. If you have any questions, ask your nurse or doctor.        cholecalciferol 1000 units tablet Commonly known as: VITAMIN D Take 1,000 Units by mouth daily.   DULoxetine 60 MG capsule Commonly known as: CYMBALTA Take 60 mg by mouth daily.   gabapentin 300 MG capsule Commonly known as: NEURONTIN Take 4 capsules by mouth 3 (three) times daily.   Magonate 500 (27 Mg) MG Tabs Generic drug: Magnesium Gluconate Take 500 mg by mouth.   metoprolol succinate 25 MG 24 hr tablet Commonly known as: TOPROL-XL Take 25 mg by mouth daily.   multivitamin tablet Take 1 tablet by mouth daily.   potassium chloride 10 MEQ CR capsule Commonly known as: MICRO-K Take 1 capsule by mouth 2 (two) times daily.   VITAMIN B-12 PO Take by mouth daily.   warfarin 2 MG tablet Commonly known as: COUMADIN Take as directed by the anticoagulation clinic. If you are unsure how to take this medication, talk to your nurse or doctor. Original instructions: 4 MG EVERY DAY EXCEPT SATURDAY 6 MG        Objective:   BP 138/77   Pulse 71   Temp (!) 96.2 F (35.7 C) (Temporal)   Ht 5\' 11"  (1.803 m)   Wt 218 lb 3.2 oz (99 kg)   SpO2 97%   BMI 30.43 kg/m   Wt Readings from Last 3 Encounters:  07/06/19 218 lb 3.2 oz (99 kg)  06/01/19 215 lb (97.5 kg)  05/07/19 214 lb 6.4 oz (97.3 kg)    Physical Exam Vitals signs and nursing note reviewed.  Constitutional:      General: He is not in acute distress.    Appearance: He is well-developed. He is not diaphoretic.  Eyes:     General: No scleral icterus.    Conjunctiva/sclera: Conjunctivae normal.  Neck:     Musculoskeletal: Neck supple.     Thyroid: No thyromegaly.  Cardiovascular:     Rate and Rhythm: Normal rate. Rhythm irregular.  Heart sounds: Normal heart sounds. No murmur.  Pulmonary:     Effort: Pulmonary effort is normal. No respiratory distress.     Breath sounds: Normal breath sounds. No wheezing.  Musculoskeletal: Normal range of motion.  Lymphadenopathy:     Cervical: No cervical adenopathy.  Skin:    General: Skin is warm and dry.     Findings: No rash.  Neurological:     Mental Status: He is alert and oriented to person, place, and time.     Coordination: Coordination normal.  Psychiatric:        Behavior: Behavior normal.     Description   Continue current dose of 6 mg on Tuesdays and Saturdays and 4 mg the rest of the week  INR 2.1  (goal 2.0-3.0) At goal!   Recheck in 4 to 6 weeks      Assessment & Plan:   Problem List Items Addressed This Visit      Cardiovascular and Mediastinum   Chronic  atrial fibrillation ([place])    Other Visit Diagnoses    Chronic anticoagulation    -  Primary   Relevant Orders   inr FINGERSTICK       Follow up plan: Return in about 6 weeks (around 08/17/2019), or if symptoms worsen or fail to improve, for Coumadin recheck.  Counseling provided for all of the vaccine components Orders Placed This Encounter  Procedures  . inr FINGERSTICK    Caryl Pina, MD [facility] 07/06/2019, 10:34 AM

## 2019-08-25 DIAGNOSIS — I482 Chronic atrial fibrillation, unspecified: Secondary | ICD-10-CM | POA: Diagnosis not present

## 2019-08-25 DIAGNOSIS — G546 Phantom limb syndrome with pain: Secondary | ICD-10-CM | POA: Diagnosis not present

## 2019-08-25 DIAGNOSIS — R32 Unspecified urinary incontinence: Secondary | ICD-10-CM | POA: Diagnosis not present

## 2019-08-25 DIAGNOSIS — I10 Essential (primary) hypertension: Secondary | ICD-10-CM | POA: Diagnosis not present

## 2019-08-25 DIAGNOSIS — E559 Vitamin D deficiency, unspecified: Secondary | ICD-10-CM | POA: Diagnosis not present

## 2019-08-25 DIAGNOSIS — E538 Deficiency of other specified B group vitamins: Secondary | ICD-10-CM | POA: Diagnosis not present

## 2019-08-25 DIAGNOSIS — J849 Interstitial pulmonary disease, unspecified: Secondary | ICD-10-CM | POA: Diagnosis not present

## 2019-08-25 DIAGNOSIS — Z89511 Acquired absence of right leg below knee: Secondary | ICD-10-CM | POA: Diagnosis not present

## 2019-08-25 DIAGNOSIS — K7469 Other cirrhosis of liver: Secondary | ICD-10-CM | POA: Diagnosis not present

## 2019-08-27 DIAGNOSIS — I10 Essential (primary) hypertension: Secondary | ICD-10-CM | POA: Diagnosis not present

## 2019-08-27 DIAGNOSIS — E559 Vitamin D deficiency, unspecified: Secondary | ICD-10-CM | POA: Diagnosis not present

## 2019-08-27 DIAGNOSIS — E538 Deficiency of other specified B group vitamins: Secondary | ICD-10-CM | POA: Diagnosis not present

## 2019-08-31 ENCOUNTER — Ambulatory Visit (INDEPENDENT_AMBULATORY_CARE_PROVIDER_SITE_OTHER): Payer: PPO | Admitting: Family Medicine

## 2019-08-31 ENCOUNTER — Encounter: Payer: Self-pay | Admitting: Family Medicine

## 2019-08-31 ENCOUNTER — Other Ambulatory Visit: Payer: Self-pay

## 2019-08-31 VITALS — BP 122/67 | HR 75 | Temp 97.5°F | Ht 71.0 in | Wt 214.6 lb

## 2019-08-31 DIAGNOSIS — I482 Chronic atrial fibrillation, unspecified: Secondary | ICD-10-CM

## 2019-08-31 DIAGNOSIS — Z7901 Long term (current) use of anticoagulants: Secondary | ICD-10-CM | POA: Diagnosis not present

## 2019-08-31 NOTE — Progress Notes (Signed)
BP 122/67   Pulse 75   Temp (!) 97.5 F (36.4 C) (Temporal)   Ht 5\' 11"  (1.803 m)   Wt 214 lb 9.6 oz (97.3 kg)   SpO2 95%   BMI 29.93 kg/m    Subjective:   Patient ID: Brett Pearson., male    DOB: [birth year], 79 y.o.   MRN: [id number]  HPI: Brett Tonks. is a 79 y.o. male presenting on 08/31/2019 for Coagulation Disorder   HPI Coumadin recheck Target goal: 2.0-3.0 Reason on anticoagulation: Chronic A. fib Patient denies any bruising or bleeding or chest pain or palpitations   Relevant past medical, surgical, family and social history reviewed and updated as indicated. Interim medical history since our last visit reviewed. Allergies and medications reviewed and updated.  Review of Systems  Constitutional: Negative for chills and fever.  Respiratory: Negative for shortness of breath and wheezing.   Cardiovascular: Negative for chest pain, palpitations and leg swelling.  Musculoskeletal: Negative for back pain and gait problem.  Skin: Negative for rash.  All other systems reviewed and are negative.   Per HPI unless specifically indicated above   Allergies as of 08/31/2019      Reactions   Amoxicillin    Other reaction(s): HIVES      Medication List       Accurate as of August 31, 2019  2:30 PM. If you have any questions, ask your nurse or doctor.        cholecalciferol 1000 units tablet Commonly known as: VITAMIN D Take 1,000 Units by mouth daily.   DULoxetine 60 MG capsule Commonly known as: CYMBALTA Take 60 mg by mouth daily.   gabapentin 300 MG capsule Commonly known as: NEURONTIN Take 4 capsules by mouth 3 (three) times daily.   Magonate 500 (27 Mg) MG Tabs Generic drug: Magnesium Gluconate Take 500 mg by mouth.   metoprolol succinate 25 MG 24 hr tablet Commonly known as: TOPROL-XL Take 25 mg by mouth daily.   multivitamin tablet Take 1 tablet by mouth daily.   potassium chloride 10 MEQ CR capsule Commonly known as: MICRO-K Take 1  capsule by mouth 2 (two) times daily.   VITAMIN B-12 PO Take by mouth daily.   warfarin 2 MG tablet Commonly known as: COUMADIN Take as directed by the anticoagulation clinic. If you are unsure how to take this medication, talk to your nurse or doctor. Original instructions: 4 MG EVERY DAY EXCEPT SATURDAY 6 MG        Objective:   BP 122/67   Pulse 75   Temp (!) 97.5 F (36.4 C) (Temporal)   Ht 5\' 11"  (1.803 m)   Wt 214 lb 9.6 oz (97.3 kg)   SpO2 95%   BMI 29.93 kg/m   Wt Readings from Last 3 Encounters:  08/31/19 214 lb 9.6 oz (97.3 kg)  07/06/19 218 lb 3.2 oz (99 kg)  06/01/19 215 lb (97.5 kg)    Physical Exam Vitals and nursing note reviewed.  Constitutional:      General: He is not in acute distress.    Appearance: He is well-developed. He is not diaphoretic.  Eyes:     General: No scleral icterus.    Conjunctiva/sclera: Conjunctivae normal.  Neck:     Thyroid: No thyromegaly.  Cardiovascular:     Rate and Rhythm: Normal rate. Rhythm irregular.     Heart sounds: Murmur (3 out of 6 holosystolic murmur in the fifth intercostal space) present.  Pulmonary:  Effort: Pulmonary effort is normal. No respiratory distress.     Breath sounds: Normal breath sounds. No wheezing.  Neurological:     Mental Status: He is alert and oriented to person, place, and time.     Coordination: Coordination normal.  Psychiatric:        Behavior: Behavior normal.       Assessment & Plan:   Problem List Items Addressed This Visit      Cardiovascular and Mediastinum   Chronic atrial fibrillation ([place])    Other Visit Diagnoses    Chronic anticoagulation    -  Primary   Relevant Orders   CoaguChek XS/INR Waived      Description   Increase current dose of 6 mg on Mondays and Saturdays and 4 mg the rest of the week  INR 1.6  (goal 2.0-3.0) F/u in 4 weeks   Recheck in 4 to 6 weeks     Follow up plan: Return if symptoms worsen or fail to improve, for 4-week recheck  INR.  Counseling provided for all of the vaccine components Orders Placed This Encounter  Procedures  . CoaguChek XS/INR [place], MD [facility] 08/31/2019, 2:30 PM

## 2019-09-30 ENCOUNTER — Other Ambulatory Visit: Payer: Self-pay

## 2019-09-30 ENCOUNTER — Encounter: Payer: Self-pay | Admitting: Family Medicine

## 2019-09-30 ENCOUNTER — Ambulatory Visit (INDEPENDENT_AMBULATORY_CARE_PROVIDER_SITE_OTHER): Payer: PPO | Admitting: Family Medicine

## 2019-09-30 VITALS — BP 134/81 | HR 84 | Temp 95.7°F | Ht 71.0 in | Wt 213.0 lb

## 2019-09-30 DIAGNOSIS — I482 Chronic atrial fibrillation, unspecified: Secondary | ICD-10-CM | POA: Diagnosis not present

## 2019-09-30 DIAGNOSIS — Z7901 Long term (current) use of anticoagulants: Secondary | ICD-10-CM

## 2019-09-30 LAB — COAGUCHEK XS/INR WAIVED
INR: 2 — ABNORMAL HIGH (ref 0.9–1.1)
Prothrombin Time: 24.2 s

## 2019-09-30 NOTE — Progress Notes (Signed)
BP 134/81   Pulse 84   Temp (!) 95.7 F (35.4 C) (Temporal)   Ht 5\' 11"  (1.803 m)   Wt 213 lb (96.6 kg)   SpO2 94%   BMI 29.71 kg/m    Subjective:   Patient ID: Brett Duffel., male    DOB: 14-May-1941, 79 y.o.   MRN: [id number]  HPI: Brett Beichler. is a 79 y.o. male presenting on 09/30/2019 for Coagulation Disorder   HPI Coumadin recheck Target goal: 2.0-3.0 Reason on anticoagulation: Chronic A. fib Patient denies any bruising or bleeding or chest pain or palpitations   Relevant past medical, surgical, family and social history reviewed and updated as indicated. Interim medical history since our last visit reviewed. Allergies and medications reviewed and updated.  Review of Systems  Constitutional: Negative for chills and fever.  Respiratory: Negative for shortness of breath and wheezing.   Cardiovascular: Negative for chest pain and leg swelling.  Gastrointestinal: Negative for blood in stool.  Genitourinary: Negative for hematuria.  Skin: Negative for rash.  All other systems reviewed and are negative.   Per HPI unless specifically indicated above      Objective:   BP 134/81   Pulse 84   Temp (!) 95.7 F (35.4 C) (Temporal)   Ht 5\' 11"  (1.803 m)   Wt 213 lb (96.6 kg)   SpO2 94%   BMI 29.71 kg/m   Wt Readings from Last 3 Encounters:  09/30/19 213 lb (96.6 kg)  08/31/19 214 lb 9.6 oz (97.3 kg)  07/06/19 218 lb 3.2 oz (99 kg)    Physical Exam Vitals and nursing note reviewed.  Constitutional:      General: He is not in acute distress.    Appearance: He is well-developed. He is not diaphoretic.  Eyes:     General: No scleral icterus.    Conjunctiva/sclera: Conjunctivae normal.  Neck:     Thyroid: No thyromegaly.  Cardiovascular:     Rate and Rhythm: Normal rate. Rhythm irregular.     Heart sounds: Normal heart sounds.  Pulmonary:     Effort: Pulmonary effort is normal. No respiratory distress.     Breath sounds: Normal breath sounds. No  wheezing.  Musculoskeletal:        General: Normal range of motion.     Cervical back: Neck supple.  Lymphadenopathy:     Cervical: No cervical adenopathy.  Skin:    General: Skin is warm and dry.     Findings: No rash.  Neurological:     Mental Status: He is alert and oriented to person, place, and time.     Coordination: Coordination normal.  Psychiatric:        Behavior: Behavior normal.        Assessment & Plan:   Problem List Items Addressed This Visit      Cardiovascular and Mediastinum   Chronic atrial fibrillation ([place])    Other Visit Diagnoses    Chronic anticoagulation    -  Primary   Relevant Orders   CoaguChek XS/INR Waived (Completed)      Description   Increase current dose of 6 mg on Mondays and Saturdays and 4 mg the rest of the week  INR 2.0  (goal 2.0-3.0) F/u in 4 -6weeks   Recheck in 4 to 6 weeks     Follow up plan: No follow-ups on file.  Counseling provided for all of the vaccine components Orders Placed This Encounter  Procedures  . CoaguChek  XS/INR [place] Kanishk Stroebel, MD [facility] 09/30/2019, 11:36 AM

## 2019-09-30 NOTE — Patient Instructions (Signed)
Description   Increase current dose of 6 mg on Mondays and Saturdays and 4 mg the rest of the week  INR 2.0  (goal 2.0-3.0) F/u in 4 -6weeks   Recheck in 4 to 6 weeks

## 2019-10-13 DIAGNOSIS — M19011 Primary osteoarthritis, right shoulder: Secondary | ICD-10-CM | POA: Diagnosis not present

## 2019-10-13 DIAGNOSIS — M19012 Primary osteoarthritis, left shoulder: Secondary | ICD-10-CM | POA: Diagnosis not present

## 2019-10-13 DIAGNOSIS — R413 Other amnesia: Secondary | ICD-10-CM | POA: Diagnosis not present

## 2019-10-13 DIAGNOSIS — R32 Unspecified urinary incontinence: Secondary | ICD-10-CM | POA: Diagnosis not present

## 2019-10-14 DIAGNOSIS — B351 Tinea unguium: Secondary | ICD-10-CM | POA: Diagnosis not present

## 2019-10-28 ENCOUNTER — Telehealth: Payer: Self-pay | Admitting: Family Medicine

## 2019-10-28 ENCOUNTER — Telehealth: Payer: Self-pay | Admitting: *Deleted

## 2019-10-28 ENCOUNTER — Other Ambulatory Visit: Payer: Self-pay | Admitting: *Deleted

## 2019-10-28 NOTE — Telephone Encounter (Signed)
Yes go ahead and get an appointment as soon as possible to have him come in and check an INR.

## 2019-10-28 NOTE — Telephone Encounter (Signed)
Taking doxycycline and it really thins his blood.  Had a bleeding episode last time on antibiotics.  Requesting to come have INR done to check levels and may need adjustment.  Wife very anxious and in a hurry to have this done. Please respond.

## 2019-10-28 NOTE — Telephone Encounter (Signed)
Want patient to come in?

## 2019-10-28 NOTE — Telephone Encounter (Signed)
Aware. Can come tomorrow for protime check.

## 2019-10-28 NOTE — Telephone Encounter (Signed)
Yes have patient schedule appointment to come in and we can do a full evaluation for Alzheimer's and also do a urine to rule out any possible infection.

## 2019-10-29 ENCOUNTER — Other Ambulatory Visit: Payer: Self-pay

## 2019-10-29 ENCOUNTER — Other Ambulatory Visit: Payer: PPO

## 2019-10-29 ENCOUNTER — Other Ambulatory Visit: Payer: Self-pay | Admitting: Family Medicine

## 2019-10-29 DIAGNOSIS — I482 Chronic atrial fibrillation, unspecified: Secondary | ICD-10-CM

## 2019-10-29 DIAGNOSIS — Z7901 Long term (current) use of anticoagulants: Secondary | ICD-10-CM | POA: Diagnosis not present

## 2019-10-29 LAB — COAGUCHEK XS/INR WAIVED
INR: 2.2 — ABNORMAL HIGH (ref 0.9–1.1)
Prothrombin Time: 26.1 s

## 2019-10-29 NOTE — Progress Notes (Signed)
Description   Continue current dose of 6 mg on Mondays and Saturdays and 4 mg the rest of the week  INR 2.2  (goal 2.0-3.0) F/u in 4 -6weeks    Caryl Pina, MD [facility] 10/29/2019, 3:34 PM

## 2019-10-29 NOTE — Telephone Encounter (Signed)
lmtcb

## 2019-11-03 ENCOUNTER — Ambulatory Visit: Payer: PPO | Admitting: Family Medicine

## 2019-11-05 NOTE — Telephone Encounter (Signed)
Patient has apt scheduled - this encounter will be closed.

## 2019-11-18 DIAGNOSIS — N3000 Acute cystitis without hematuria: Secondary | ICD-10-CM | POA: Diagnosis not present

## 2019-11-18 DIAGNOSIS — R32 Unspecified urinary incontinence: Secondary | ICD-10-CM | POA: Diagnosis not present

## 2019-11-19 ENCOUNTER — Encounter: Payer: Self-pay | Admitting: Family Medicine

## 2019-11-19 ENCOUNTER — Telehealth: Payer: Self-pay | Admitting: Family Medicine

## 2019-11-19 ENCOUNTER — Ambulatory Visit (INDEPENDENT_AMBULATORY_CARE_PROVIDER_SITE_OTHER): Payer: PPO | Admitting: Family Medicine

## 2019-11-19 ENCOUNTER — Other Ambulatory Visit: Payer: Self-pay

## 2019-11-19 VITALS — BP 96/56 | HR 71 | Temp 97.1°F | Ht 71.0 in | Wt 211.0 lb

## 2019-11-19 DIAGNOSIS — Z7901 Long term (current) use of anticoagulants: Secondary | ICD-10-CM | POA: Diagnosis not present

## 2019-11-19 DIAGNOSIS — I482 Chronic atrial fibrillation, unspecified: Secondary | ICD-10-CM | POA: Diagnosis not present

## 2019-11-19 LAB — COAGUCHEK XS/INR WAIVED
INR: 2.2 — ABNORMAL HIGH (ref 0.9–1.1)
Prothrombin Time: 26.9 s

## 2019-11-19 NOTE — Progress Notes (Signed)
BP (!) 96/56   Pulse 71   Temp (!) 97.1 F (36.2 C) (Temporal)   Ht 5\' 11"  (1.803 m)   Wt 211 lb (95.7 kg)   BMI 29.43 kg/m    Subjective:   Patient ID: Brett Duffel., male    DOB: 1941/04/03, 79 y.o.   MRN: [id number]  HPI: Brett Legner. is a 79 y.o. male presenting on 11/19/2019 for Medical Management of Chronic Issues   HPI Coumadin recheck Target goal: 2.0-3.0 Reason on anticoagulation: A. fib Patient denies any bruising or bleeding or chest pain or palpitations  Relevant past medical, surgical, family and social history reviewed and updated as indicated. Interim medical history since our last visit reviewed. Allergies and medications reviewed and updated.  Review of Systems  Constitutional: Negative for chills and fever.  Respiratory: Negative for shortness of breath and wheezing.   Cardiovascular: Negative for chest pain and leg swelling.  Musculoskeletal: Negative for back pain and gait problem.  Skin: Negative for rash.  All other systems reviewed and are negative.   Per HPI unless specifically indicated above   Allergies as of 11/19/2019      Reactions   Amoxicillin    Other reaction(s): HIVES      Medication List       Accurate as of November 19, 2019  5:13 PM. If you have any questions, ask your nurse or doctor.        cefdinir 300 MG capsule Commonly known as: OMNICEF Take by mouth.   cholecalciferol 1000 units tablet Commonly known as: VITAMIN D Take 1,000 Units by mouth daily.   DULoxetine 60 MG capsule Commonly known as: CYMBALTA Take 60 mg by mouth daily.   gabapentin 300 MG capsule Commonly known as: NEURONTIN Take 4 capsules by mouth 3 (three) times daily.   Magonate 500 (27 Mg) MG Tabs Generic drug: Magnesium Gluconate Take 500 mg by mouth.   metoprolol succinate 25 MG 24 hr tablet Commonly known as: TOPROL-XL Take 25 mg by mouth daily.   multivitamin tablet Take 1 tablet by mouth daily.   nitrofurantoin 100 MG  capsule Commonly known as: MACRODANTIN Take by mouth.   potassium chloride 10 MEQ CR capsule Commonly known as: MICRO-K Take 1 capsule by mouth 2 (two) times daily.   VITAMIN B-12 PO Take by mouth daily.   warfarin 2 MG tablet Commonly known as: COUMADIN Take as directed by the anticoagulation clinic. If you are unsure how to take this medication, talk to your nurse or doctor. Original instructions: 4 MG EVERY DAY EXCEPT SATURDAY 6 MG        Objective:   BP (!) 96/56   Pulse 71   Temp (!) 97.1 F (36.2 C) (Temporal)   Ht 5\' 11"  (1.803 m)   Wt 211 lb (95.7 kg)   BMI 29.43 kg/m   Wt Readings from Last 3 Encounters:  11/19/19 211 lb (95.7 kg)  09/30/19 213 lb (96.6 kg)  08/31/19 214 lb 9.6 oz (97.3 kg)    Physical Exam Vitals and nursing note reviewed.  Constitutional:      General: He is not in acute distress.    Appearance: He is well-developed. He is not diaphoretic.  Eyes:     General: No scleral icterus.    Conjunctiva/sclera: Conjunctivae normal.  Neck:     Thyroid: No thyromegaly.  Skin:    General: Skin is warm and dry.     Findings: No rash.  Neurological:  Mental Status: He is alert and oriented to person, place, and time.     Coordination: Coordination normal.  Psychiatric:        Behavior: Behavior normal.       Assessment & Plan:   Problem List Items Addressed This Visit      Cardiovascular and Mediastinum   Chronic atrial fibrillation ([place])    Other Visit Diagnoses    Chronic anticoagulation    -  Primary   Relevant Orders   CoaguChek XS/INR Waived      Description   Continue current dose of 6 mg on Mondays and Saturdays and 4 mg the rest of the week  INR 2.2  (goal 2.0-3.0) F/u in 2 weeks because he started antibiotic today    Recheck Coumadin sooner because of antibiotic start by urology today Follow up plan: Return in about 2 weeks (around 12/03/2019), or if symptoms worsen or fail to improve, for Return in 6 to 8 weeks  for INR recheck.  Counseling provided for all of the vaccine components Orders Placed This Encounter  Procedures  . CoaguChek XS/INR [name], MD [facility] 11/19/2019, 5:13 PM

## 2019-11-19 NOTE — Telephone Encounter (Signed)
Talked to [facility]

## 2019-11-19 NOTE — Telephone Encounter (Signed)
Pt has a visit today. Pt is on two ATB's wife wants to make sure they will not interfere with INR. Will make Dr. Warrick Parisian aware.  Wife made aware that rocephin was given at Dr halls office

## 2019-12-03 ENCOUNTER — Ambulatory Visit (INDEPENDENT_AMBULATORY_CARE_PROVIDER_SITE_OTHER): Payer: PPO | Admitting: Family Medicine

## 2019-12-03 ENCOUNTER — Encounter: Payer: Self-pay | Admitting: Family Medicine

## 2019-12-03 ENCOUNTER — Other Ambulatory Visit: Payer: Self-pay

## 2019-12-03 VITALS — BP 99/60 | HR 65 | Temp 97.3°F | Ht 71.0 in | Wt 208.0 lb

## 2019-12-03 DIAGNOSIS — I482 Chronic atrial fibrillation, unspecified: Secondary | ICD-10-CM | POA: Diagnosis not present

## 2019-12-03 DIAGNOSIS — I10 Essential (primary) hypertension: Secondary | ICD-10-CM | POA: Diagnosis not present

## 2019-12-03 DIAGNOSIS — I358 Other nonrheumatic aortic valve disorders: Secondary | ICD-10-CM | POA: Diagnosis not present

## 2019-12-03 DIAGNOSIS — Z7901 Long term (current) use of anticoagulants: Secondary | ICD-10-CM

## 2019-12-03 DIAGNOSIS — R350 Frequency of micturition: Secondary | ICD-10-CM

## 2019-12-03 DIAGNOSIS — E538 Deficiency of other specified B group vitamins: Secondary | ICD-10-CM | POA: Diagnosis not present

## 2019-12-03 LAB — COAGUCHEK XS/INR WAIVED
INR: 3.1 — ABNORMAL HIGH (ref 0.9–1.1)
Prothrombin Time: 36.6 s

## 2019-12-03 NOTE — Progress Notes (Signed)
BP 99/60   Pulse 65   Temp (!) 97.3 F (36.3 C)   Ht _0  (1.803 m)   Wt 208 lb (94.3 kg)   SpO2 97%   BMI 29.01 kg/m    Subjective:   Patient ID: Brett Pearson., male    DOB: 13-Mar-1941, 79 y.o.   MRN: [id number]  HPI: Brett Pearson. is a 79 y.o. male presenting on 12/03/2019 for Coumadin Recheck, Urinary Frequency Follow Up, and Weight Loss.  1. Coumadin Recheck:  Target Goal: 2.0-3.0 Reason on anticoagulation: A. Fib  Brett Pearson denies abnormal bruising, bleeding, h/a, stomach pain, blood in stool, and blood in urine.  His INR is 3.1 today in clinic, likely due to the course of antibiotics (Cefdinir) that he completed about 4 days ago. He is currently taking Macrodantin for chronic UTI maintenance/management but this medication does not show any interaction with Coumadin.  2. Urinary Frequency Follow Up:  Brett Pearson Pearson taking a course of Cefdinir about 4 days ago. He then started taking Macrodantin (a 60 day prescription). He is seeing Urology for management of the chronic UTI.  3. Weight Loss:  Brett Pearson wife is concerned about his recent weight loss (3 lbs in 2 weeks) and lack of appetite. Brett Pearson pretty well at breakfast and lunch and has an afternoon snack of ice cream but he does not eat very much at all during the evening or for the dinner meal.  Relevant past medical, surgical, family and social history reviewed and updated as indicated. Interim medical history since our last visit reviewed.  Allergies and medications reviewed and updated.  Review of Systems  Constitutional: Negative for chills and fever.  Eyes: Negative for visual disturbance.  Respiratory: Negative for shortness of breath and wheezing.   Cardiovascular: Negative for chest pain and leg swelling.  Gastrointestinal: Negative for abdominal pain.  Genitourinary: Positive for frequency. Negative for decreased urine volume, dysuria, hematuria and urgency.  Musculoskeletal:  Negative for back pain and gait problem.  Skin: Negative for rash.  Neurological: Negative for dizziness, weakness and light-headedness.  All other systems reviewed and are negative.   Per HPI unless specifically indicated above   Allergies as of 12/03/2019      Reactions   Amoxicillin    Other reaction(s): HIVES      Medication List       Accurate as of December 03, 2019  4:41 PM. If you have any questions, ask your nurse or doctor.        cholecalciferol 1000 units tablet Commonly known as: VITAMIN D Take 1,000 Units by mouth daily.   DULoxetine 60 MG capsule Commonly known as: CYMBALTA Take 60 mg by mouth daily.   gabapentin 300 MG capsule Commonly known as: NEURONTIN Take 4 capsules by mouth 3 (three) times daily.   Magonate 500 (27 Mg) MG Tabs Generic drug: Magnesium Gluconate Take 500 mg by mouth.   metoprolol succinate 25 MG 24 hr tablet Commonly known as: TOPROL-XL Take 25 mg by mouth daily.   multivitamin tablet Take 1 tablet by mouth daily.   nitrofurantoin (macrocrystal-monohydrate) 100 MG capsule Commonly known as: MACROBID Take 100 mg by mouth 2 (two) times daily.   nitrofurantoin 100 MG capsule Commonly known as: MACRODANTIN Take by mouth.   potassium chloride 10 MEQ CR capsule Commonly known as: MICRO-K Take 1 capsule by mouth 2 (two) times daily.   VITAMIN B-12 PO Take by mouth daily.   warfarin  2 MG tablet Commonly known as: COUMADIN Take as directed by the anticoagulation clinic. If you are unsure how to take this medication, talk to your nurse or doctor. Original instructions: 4 MG EVERY DAY EXCEPT SATURDAY 6 MG        Objective:   BP 99/60   Pulse 65   Temp (!) 97.3 F (36.3 C)   Ht _0  (1.803 m)   Wt 94.3 kg   SpO2 97%   BMI 29.01 kg/m   Wt Readings from Last 3 Encounters:  12/03/19 94.3 kg  11/19/19 95.7 kg  09/30/19 96.6 kg    Physical Exam Constitutional:      General: He is not in acute distress.     Appearance: Normal appearance.  Cardiovascular:     Rate and Rhythm: Rhythm irregularly irregular.     Heart sounds: Murmur present. Diastolic murmur present with a grade of 3/4.     Comments: S1/S2 auscultated. Radial pulses palpated bilaterally. Pulmonary:     Effort: Pulmonary effort is normal.     Breath sounds: Normal breath sounds. No wheezing, rhonchi or rales.  Skin:    General: Skin is warm and dry.  Neurological:     Mental Status: He is alert and oriented to person, place, and time.     Coordination: Coordination normal.     Gait: Gait normal.  Psychiatric:        Mood and Affect: Mood normal.        Behavior: Behavior normal.        Thought Content: Thought content normal.        Judgment: Judgment normal.    Results for orders placed or performed in visit on 11/19/19  CoaguChek XS/INR Waived  Result Value Ref Range   INR 2.2 (H) 0.9 - 1.1   Prothrombin Time 26.9 sec    Assessment & Plan:   Problem List Items Addressed This Visit      Cardiovascular and Mediastinum   Chronic atrial fibrillation (HCC)   Relevant Orders   TSH (Completed)   CBC with Differential/Platelet (Completed)   CMP14+EGFR (Completed)   Lipid panel (Completed)   ECHOCARDIOGRAM COMPLETE   Essential hypertension   Relevant Orders   CMP14+EGFR (Completed)   Lipid panel (Completed)     Other   Vitamin B12 deficiency   Relevant Orders   Vitamin B12 (Completed)    Other Visit Diagnoses    Chronic anticoagulation    -  Primary   Relevant Orders   CoaguChek XS/INR Waived (Completed)   CMP14+EGFR (Completed)   Urinary frequency       Relevant Orders   Urinalysis, Routine w reflex microscopic (Completed)   Systolic murmur of aorta       Relevant Orders   ECHOCARDIOGRAM COMPLETE      1. Coumadin Recheck:  INR is 3.1 today. No changes to medication as the supratherapeutic INR is likely due to the course of Cefdinir that Brett Pearson 4 days ago. Will recheck INR in 4-6  weeks.  2. Urinary Frequency Recheck:  Continue taking Macrodantin for chronic UTI management as per Urology. Will run U/A today.  3. Weight Loss:  Advised Brett Pearson wife that a 3 lb weight loss in 2 weeks is not concerning at the moment (due to recent illness and antibiotics likely affecting appetite), but will run bloodwork today (CMP, CBC, Lipid Panel, TSH, Vitamin B12).  Will also order an Echocardiogram due to Brett Pearson Aortic Stenosis murmur auscultated during  physical exam today. Mr Brett Pearson's wife was not aware of the murmur. Last Echocardiogram was several years ago, so will order an Echocardiogram to be done at [facility].  Follow up plan: Return if symptoms worsen or fail to improve, for 4 to 6-week INR recheck.   Orders Placed This Encounter  Procedures  . CoaguChek XS/INR Waived  . Urinalysis, Routine w reflex microscopic  . TSH  . Vitamin B12  . CBC with Differential/Platelet  . CMP14+EGFR  . Lipid panel  . ECHOCARDIOGRAM COMPLETE    Gaynelle Arabian, PA-S2 [facility] 12/03/2019, 4:41 PM Patient seen and examined with Gaynelle Arabian, PA student, agree with assessment and plan above.  We will order echocardiogram for murmur as it has been sometime since he has had one.  The murmur has been present for the whole time he has been here with [place] over the past few years.  Description   Continue current dose of 6 mg on Mondays and Saturdays and 4 mg the rest of the week  INR 3.1 (goal 2.0-3.0) Follow-up in 4 to 6 weeks    Caryl Pina, MD [place] [facility] 12/09/2019, 10:40 PM

## 2019-12-04 ENCOUNTER — Other Ambulatory Visit: Payer: PPO

## 2019-12-04 DIAGNOSIS — R350 Frequency of micturition: Secondary | ICD-10-CM | POA: Diagnosis not present

## 2019-12-04 LAB — URINALYSIS, ROUTINE W REFLEX MICROSCOPIC
Bilirubin, UA: NEGATIVE
Glucose, UA: NEGATIVE
Nitrite, UA: NEGATIVE
Protein,UA: NEGATIVE
RBC, UA: NEGATIVE
Specific Gravity, UA: >1.03 — ABNORMAL HIGH (ref 1.005–1.030)
Urobilinogen, Ur: 1 mg/dL (ref 0.2–1.0)
pH, UA: 5 (ref 5.0–7.5)

## 2019-12-04 LAB — CBC WITH DIFFERENTIAL/PLATELET
Basophils Absolute: 0.1 10*3/uL (ref 0.0–0.2)
Basos: 1 %
EOS (ABSOLUTE): 0.9 10*3/uL — ABNORMAL HIGH (ref 0.0–0.4)
Eos: 12 %
Hematocrit: 41 % (ref 37.5–51.0)
Hemoglobin: 14 g/dL (ref 13.0–17.7)
Immature Grans (Abs): 0 10*3/uL (ref 0.0–0.1)
Immature Granulocytes: 0 %
Lymphocytes Absolute: 2.5 10*3/uL (ref 0.7–3.1)
Lymphs: 33 %
MCH: 33.5 pg — ABNORMAL HIGH (ref 26.6–33.0)
MCHC: 34.1 g/dL (ref 31.5–35.7)
MCV: 98 fL — ABNORMAL HIGH (ref 79–97)
Monocytes Absolute: 0.8 10*3/uL (ref 0.1–0.9)
Monocytes: 11 %
Neutrophils Absolute: 3.2 10*3/uL (ref 1.4–7.0)
Neutrophils: 43 %
Platelets: 218 10*3/uL (ref 150–450)
RBC: 4.18 x10E6/uL (ref 4.14–5.80)
RDW: 12.6 % (ref 11.6–15.4)
WBC: 7.4 10*3/uL (ref 3.4–10.8)

## 2019-12-04 LAB — MICROSCOPIC EXAMINATION: Renal Epithel, UA: NONE SEEN /hpf

## 2019-12-04 LAB — CMP14+EGFR
ALT: 14 IU/L (ref 0–44)
AST: 23 IU/L (ref 0–40)
Albumin/Globulin Ratio: 1.4 (ref 1.2–2.2)
Albumin: 4.2 g/dL (ref 3.7–4.7)
Alkaline Phosphatase: 88 IU/L (ref 39–117)
BUN/Creatinine Ratio: 14 (ref 10–24)
BUN: 15 mg/dL (ref 8–27)
Bilirubin Total: 0.8 mg/dL (ref 0.0–1.2)
CO2: 22 mmol/L (ref 20–29)
Calcium: 9.6 mg/dL (ref 8.6–10.2)
Chloride: 105 mmol/L (ref 96–106)
Creatinine, Ser: 1.07 mg/dL (ref 0.76–1.27)
GFR calc Af Amer: 76 mL/min/{1.73_m2} (ref >59)
GFR calc non Af Amer: 66 mL/min/{1.73_m2} (ref >59)
Globulin, Total: 3 g/dL (ref 1.5–4.5)
Glucose: 42 mg/dL — ABNORMAL LOW (ref 65–99)
Potassium: 4.5 mmol/L (ref 3.5–5.2)
Sodium: 146 mmol/L — ABNORMAL HIGH (ref 134–144)
Total Protein: 7.2 g/dL (ref 6.0–8.5)

## 2019-12-04 LAB — VITAMIN B12: Vitamin B-12: >2000 pg/mL — ABNORMAL HIGH (ref 232–1245)

## 2019-12-04 LAB — LIPID PANEL
Chol/HDL Ratio: 2.7 ratio (ref 0.0–5.0)
Cholesterol, Total: 141 mg/dL (ref 100–199)
HDL: 52 mg/dL (ref >39)
LDL Chol Calc (NIH): 74 mg/dL (ref 0–99)
Triglycerides: 78 mg/dL (ref 0–149)
VLDL Cholesterol Cal: 15 mg/dL (ref 5–40)

## 2019-12-04 LAB — TSH: TSH: 4.32 u[IU]/mL (ref 0.450–4.500)

## 2019-12-11 ENCOUNTER — Telehealth: Payer: Self-pay | Admitting: Family Medicine

## 2019-12-11 MED ORDER — DONEPEZIL HCL 5 MG PO TABS: 5.0000 mg | ORAL_TABLET | Freq: Every day | ORAL | 2 refills | Status: DC

## 2019-12-11 NOTE — Telephone Encounter (Signed)
I sent in Aricept for the patient

## 2019-12-11 NOTE — Telephone Encounter (Signed)
Aware, script is ready, per message left on voice mail.

## 2019-12-16 DIAGNOSIS — N3000 Acute cystitis without hematuria: Secondary | ICD-10-CM | POA: Diagnosis not present

## 2019-12-16 DIAGNOSIS — N390 Urinary tract infection, site not specified: Secondary | ICD-10-CM | POA: Diagnosis not present

## 2019-12-16 DIAGNOSIS — R32 Unspecified urinary incontinence: Secondary | ICD-10-CM | POA: Diagnosis not present

## 2019-12-16 DIAGNOSIS — R8290 Unspecified abnormal findings in urine: Secondary | ICD-10-CM | POA: Diagnosis not present

## 2019-12-16 DIAGNOSIS — N2889 Other specified disorders of kidney and ureter: Secondary | ICD-10-CM | POA: Diagnosis not present

## 2019-12-17 ENCOUNTER — Ambulatory Visit (HOSPITAL_COMMUNITY): Payer: Medicare Other

## 2019-12-24 ENCOUNTER — Encounter: Payer: Self-pay | Admitting: Family

## 2019-12-24 ENCOUNTER — Ambulatory Visit (INDEPENDENT_AMBULATORY_CARE_PROVIDER_SITE_OTHER): Payer: PPO | Admitting: Family

## 2019-12-24 DIAGNOSIS — M79652 Pain in left thigh: Secondary | ICD-10-CM

## 2019-12-24 DIAGNOSIS — I482 Chronic atrial fibrillation, unspecified: Secondary | ICD-10-CM

## 2019-12-24 DIAGNOSIS — Z96642 Presence of left artificial hip joint: Secondary | ICD-10-CM | POA: Diagnosis not present

## 2019-12-24 MED ORDER — BACLOFEN 10 MG PO TABS: 5.0000 mg | ORAL_TABLET | Freq: Three times a day (TID) | ORAL | 0 refills | Status: DC

## 2019-12-24 NOTE — Progress Notes (Signed)
Virtual Visit via telephone Note Due to COVID-19 pandemic this visit was conducted virtually. This visit type was conducted due to national recommendations for restrictions regarding the COVID-19 Pandemic (e.g. social distancing, sheltering in place) in an effort to limit this patient's exposure and mitigate transmission in our community. All issues noted in this document were discussed and addressed.  A physical exam was not performed with this format.  I connected with Brett Duffel. on 12/24/19 at 12:45 pm  by telephone and verified that I am speaking with the correct person using two identifiers. Brett Duffel. is currently located at home  and wife is currently with him during visit. The provider, Evelina Dun, FNP is located in their office at time of visit.  I discussed the limitations, risks, security and privacy concerns of performing an evaluation and management service by telephone and the availability of in person appointments. I also discussed with the patient that there may be a patient responsible charge related to this service. The patient expressed understanding and agreed to proceed.   History and Present Illness:  Pt calls the office today with left thigh that started two days ago. He is on warfarin for A Fib and denies any missed doses. Denies any redness, swelling, bruising, warmth, or fever. Denies any injury. 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  Leg Pain  The incident occurred more than 1 week ago. There was no injury mechanism. The pain is present in the left thigh. The quality of the pain is described as aching. The pain is at a severity of 7/10. The pain is moderate. The pain has been worsening since onset. Associated symptoms include an inability to bear weight and muscle weakness. Pertinent negatives include no numbness or tingling. He reports no foreign bodies present. The symptoms are aggravated by weight bearing. He has tried rest for the symptoms.  The treatment provided no relief.      Review of Systems  Neurological: Negative for tingling and numbness.  All other systems reviewed and are negative.    Observations/Objective: No SOB or distress noted   Assessment and Plan: 1. Left thigh pain Rest Tylenol as needed, no NSAID's since taking warfarin  Given he is on Warfarin for A Fib, I doubt this is DVT and more muscle related.  Will do x-ray given hx of hip replacement and he is wheelchair bound  Call if symptoms worsen or do not improve  - baclofen (LIORESAL) 10 MG tablet; Take 0.5 tablets (5 mg total) by mouth 3 (three) times daily.  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 Chronic atrial fibrillation ([place])      I discussed the assessment and treatment plan with the patient. The patient was provided an opportunity to ask questions and all were answered. The patient agreed with the plan and demonstrated an understanding of the instructions.   The patient was advised to call back or seek an in-person evaluation if the symptoms worsen or if the condition fails to improve as anticipated.  The above assessment and management plan was discussed with the patient. The patient verbalized understanding of and has agreed to the management plan. Patient is aware to call the clinic if symptoms persist or worsen. Patient is aware when to return to the clinic for a follow-up visit. Patient educated on when it is appropriate  to go to the emergency department.   Time call ended:  1:01 pm   I provided 16 minutes of non-face-to-face time during this encounter.    Evelina Dun, FNP

## 2019-12-25 ENCOUNTER — Other Ambulatory Visit: Payer: Self-pay

## 2019-12-25 ENCOUNTER — Other Ambulatory Visit (INDEPENDENT_AMBULATORY_CARE_PROVIDER_SITE_OTHER): Payer: PPO

## 2019-12-25 DIAGNOSIS — M1612 Unilateral primary osteoarthritis, left hip: Secondary | ICD-10-CM | POA: Diagnosis not present

## 2019-12-25 DIAGNOSIS — M79652 Pain in left thigh: Secondary | ICD-10-CM

## 2019-12-28 ENCOUNTER — Telehealth: Payer: Self-pay | Admitting: Family Medicine

## 2019-12-28 NOTE — Telephone Encounter (Signed)
Reviewed xray results.

## 2019-12-31 ENCOUNTER — Other Ambulatory Visit: Payer: Self-pay | Admitting: Family

## 2019-12-31 DIAGNOSIS — I482 Chronic atrial fibrillation, unspecified: Secondary | ICD-10-CM

## 2019-12-31 DIAGNOSIS — Z7901 Long term (current) use of anticoagulants: Secondary | ICD-10-CM

## 2020-01-05 ENCOUNTER — Ambulatory Visit (HOSPITAL_COMMUNITY): Admission: RE | Admit: 2020-01-05 | Payer: Medicare Other | Source: Ambulatory Visit

## 2020-01-14 ENCOUNTER — Other Ambulatory Visit: Payer: Self-pay | Admitting: Family Medicine

## 2020-01-14 DIAGNOSIS — Z7901 Long term (current) use of anticoagulants: Secondary | ICD-10-CM

## 2020-01-14 DIAGNOSIS — I482 Chronic atrial fibrillation, unspecified: Secondary | ICD-10-CM

## 2020-01-15 ENCOUNTER — Ambulatory Visit: Payer: PPO | Admitting: Family Medicine

## 2020-01-19 ENCOUNTER — Telehealth: Payer: Self-pay | Admitting: Family Medicine

## 2020-01-19 NOTE — Telephone Encounter (Signed)
lmtcb

## 2020-01-19 NOTE — Telephone Encounter (Signed)
Patient has been off of Furosemide for quite some time and also has not been taking his potassium since discontinuing the Furosemide.  Patient's care giver is calling to verify that this is correct and should continue to not take the potassium.  Please advise.

## 2020-01-19 NOTE — Telephone Encounter (Signed)
This should be fine. His last potassium was normal range. Keep follow up with PCP and he will monitor. Call office if he is having any increase swelling, SOB.

## 2020-01-21 ENCOUNTER — Ambulatory Visit (INDEPENDENT_AMBULATORY_CARE_PROVIDER_SITE_OTHER): Payer: PPO | Admitting: Family Medicine

## 2020-01-21 ENCOUNTER — Encounter: Payer: Self-pay | Admitting: Family Medicine

## 2020-01-21 ENCOUNTER — Other Ambulatory Visit: Payer: Self-pay

## 2020-01-21 VITALS — BP 98/58 | HR 50 | Temp 98.4°F | Ht 71.0 in | Wt 211.1 lb

## 2020-01-21 DIAGNOSIS — I482 Chronic atrial fibrillation, unspecified: Secondary | ICD-10-CM

## 2020-01-21 DIAGNOSIS — R413 Other amnesia: Secondary | ICD-10-CM

## 2020-01-21 DIAGNOSIS — Z789 Other specified health status: Secondary | ICD-10-CM | POA: Diagnosis not present

## 2020-01-21 DIAGNOSIS — R29898 Other symptoms and signs involving the musculoskeletal system: Secondary | ICD-10-CM

## 2020-01-21 LAB — COAGUCHEK XS/INR WAIVED
INR: 2.3 — ABNORMAL HIGH (ref 0.9–1.1)
Prothrombin Time: 27.2 s

## 2020-01-21 MED ORDER — MEMANTINE HCL 5 MG PO TABS: 5.0000 mg | ORAL_TABLET | Freq: Two times a day (BID) | ORAL | 3 refills | Status: DC

## 2020-01-21 NOTE — Telephone Encounter (Signed)
Aware of recommendation and pt has appt today

## 2020-01-21 NOTE — Progress Notes (Signed)
BP (!) 98/58   Pulse (!) 50   Temp 98.4 F (36.9 C)   Ht 5\' 11"  (1.803 m)   Wt 211 lb 2 oz (95.8 kg)   BMI 29.45 kg/m    Subjective:   Patient ID: Brett Duffel., male    DOB: Jan 11, 1941, 79 y.o.   MRN: [id number]  HPI: Brett Hlavacek. is a 79 y.o. male presenting on 01/21/2020 for Medical Management of Chronic Issues and Atrial Fibrillation   HPI Coumadin recheck Target goal: 2.0-3.0 Reason on anticoagulation: Chronic A. fib Patient denies any bruising or bleeding or chest pain or palpitations   Worsening memory issues Patient is currently on donepezil and it does not seem like it is helping as much would like to try something else to help with it.  He says that his memory is worsening and his wife agrees.  She denies any major mood issues or mood disorders.  She feels like those are doing pretty well right now.   Patient has weakness in his lower extremities and has had it for some time and uses arm crutches to help him with mobility but that is limited.  He has difficulty getting in and out of the shower at home and currently has a nurses aide come by twice a week to help him shower twice a week, that nurses aide is leaving and they need to get a new one through insurance.  Relevant past medical, surgical, family and social history reviewed and updated as indicated. Interim medical history since our last visit reviewed. Allergies and medications reviewed and updated.  Review of Systems  Constitutional: Negative for chills and fever.  Eyes: Negative for visual disturbance.  Respiratory: Negative for shortness of breath and wheezing.   Cardiovascular: Negative for chest pain and leg swelling.  Musculoskeletal: Positive for gait problem. Negative for back pain.  Skin: Negative for rash.  Neurological: Positive for weakness.  Psychiatric/Behavioral: Positive for confusion and decreased concentration. Negative for behavioral problems.  All other systems reviewed and are  negative.   Per HPI unless specifically indicated above   Allergies as of 01/21/2020      Reactions   Amoxicillin    Other reaction(s): HIVES      Medication List       Accurate as of January 21, 2020  3:39 PM. If you have any questions, ask your nurse or doctor.        STOP taking these medications   nitrofurantoin (macrocrystal-monohydrate) 100 MG capsule Commonly known as: MACROBID Stopped by: [name] Shakea Isip, MD   nitrofurantoin 100 MG capsule Commonly known as: MACRODANTIN Stopped by: [name] Donnisha Besecker, MD   potassium chloride 10 MEQ CR capsule Commonly known as: MICRO-K Stopped by: [name] Vidya Bamford, MD     TAKE these medications   baclofen 10 MG tablet Commonly known as: LIORESAL Take 0.5 tablets (5 mg total) by mouth 3 (three) times daily.   cholecalciferol 1000 units tablet Commonly known as: VITAMIN D Take 1,000 Units by mouth daily.   donepezil 5 MG tablet Commonly known as: Aricept Take 1 tablet (5 mg total) by mouth at bedtime.   DULoxetine 60 MG capsule Commonly known as: CYMBALTA Take 60 mg by mouth daily.   gabapentin 300 MG capsule Commonly known as: NEURONTIN Take 4 capsules by mouth 3 (three) times daily.   Magonate 500 (27 Mg) MG Tabs Generic drug: Magnesium Gluconate Take 500 mg by mouth.   memantine 5 MG tablet  Commonly known as: Namenda Take 1 tablet (5 mg total) by mouth 2 (two) times daily. Started by: Worthy Rancher, MD   metoprolol succinate 25 MG 24 hr tablet Commonly known as: TOPROL-XL Take 25 mg by mouth daily.   multivitamin tablet Take 1 tablet by mouth daily.   VITAMIN B-12 PO Take by mouth daily.   warfarin 2 MG tablet Commonly known as: COUMADIN Take as directed by the anticoagulation clinic. If you are unsure how to take this medication, talk to your nurse or doctor. Original instructions: TAKE 4 MG (2 TABLETS) EVERY DAY EXCEPT SATURDAY 6 MG (3 TABLETS)        Objective:   BP (!) 98/58    Pulse (!) 50   Temp 98.4 F (36.9 C)   Ht 5\' 11"  (1.803 m)   Wt 211 lb 2 oz (95.8 kg)   BMI 29.45 kg/m   Wt Readings from Last 3 Encounters:  01/21/20 211 lb 2 oz (95.8 kg)  12/03/19 208 lb (94.3 kg)  11/19/19 211 lb (95.7 kg)    Physical Exam Vitals and nursing note reviewed.  Constitutional:      General: He is not in acute distress.    Appearance: He is well-developed. He is not diaphoretic.  Eyes:     General: No scleral icterus.    Conjunctiva/sclera: Conjunctivae normal.  Neck:     Thyroid: No thyromegaly.  Cardiovascular:     Rate and Rhythm: Normal rate. Rhythm irregular.     Heart sounds: Normal heart sounds. No murmur heard.   Pulmonary:     Effort: Pulmonary effort is normal. No respiratory distress.     Breath sounds: Normal breath sounds. No wheezing.  Musculoskeletal:     Cervical back: Neck supple.  Lymphadenopathy:     Cervical: No cervical adenopathy.  Skin:    General: Skin is warm and dry.     Findings: No rash.  Neurological:     Mental Status: He is alert and oriented to person, place, and time.     Coordination: Coordination normal.     Comments: Bilateral lower extremity weakness and uses arm crutches to walk quite many years  Psychiatric:        Behavior: Behavior normal.    Description   Continue current dose of 6 mg on Mondays and Saturdays and 4 mg the rest of the week  INR 2.3 (goal 2.0-3.0) Follow-up in 4 to 6 weeks       Assessment & Plan:   Problem List Items Addressed This Visit      Cardiovascular and Mediastinum   Chronic atrial fibrillation (HCC) - Primary   Relevant Orders   CoaguChek XS/INR Waived     Other   Memory loss   Relevant Medications   memantine (NAMENDA) 5 MG tablet   Other Relevant Orders   Ambulatory referral to [place]    Other Visit Diagnoses    Weakness of both lower extremities       Relevant Orders   Ambulatory referral to [place]   Deficit in activities of daily living (ADL)         Relevant Orders   Ambulatory referral to [facility] home health order for lower extremity weakness and memory issues and difficulty with ADLs, specifically bathing, cannot get in and out of the tub by himself and his wife is not able to help him Follow up plan: Return if symptoms worsen or fail  to improve, for 6 to 8-week return INR.  Counseling provided for all of the vaccine components Orders Placed This Encounter  Procedures  . CoaguChek XS/INR Waived  . Ambulatory referral to [place], MD [facility] 01/21/2020, 3:39 PM

## 2020-01-25 ENCOUNTER — Ambulatory Visit (INDEPENDENT_AMBULATORY_CARE_PROVIDER_SITE_OTHER): Payer: PPO | Admitting: *Deleted

## 2020-01-25 DIAGNOSIS — Z0000 Encounter for general adult medical examination without abnormal findings: Secondary | ICD-10-CM | POA: Diagnosis not present

## 2020-01-25 MED ORDER — GABAPENTIN 300 MG PO CAPS: 1200.0000 mg | ORAL_CAPSULE | Freq: Three times a day (TID) | ORAL | 0 refills | Status: DC

## 2020-01-25 NOTE — Progress Notes (Addendum)
MEDICARE ANNUAL WELLNESS VISIT  01/25/2020  Telephone Visit Disclaimer This Medicare AWV was conducted by telephone due to national recommendations for restrictions regarding the COVID-19 Pandemic (e.g. social distancing).  I verified, using two identifiers, that I am speaking with Brett Pearson. or their authorized healthcare agent. I discussed the limitations, risks, security, and privacy concerns of performing an evaluation and management service by telephone and the potential availability of an in-person appointment in the future. The patient expressed understanding and agreed to proceed.   Subjective:  Brett Pearson. is a 79 y.o. male patient of [name], [name], MD who had a Medicare Annual Wellness Visit today via telephone. [name] is Retired and lives with their spouse. he has 2 children. he reports that he is socially active and does interact with friends/family regularly. he is minimally physically active and enjoys reading, going out to eat lunch with his wife daily, going out to eat with the "lunch bunch" every Friday and surfing the internet.  Patient Care Team: [name], [name], MD as PCP - General (Family Medicine)  Advanced Directives 01/25/2020 01/07/2019  Does Patient Have a Medical Advance Directive? Yes Yes  Type of Paramedic of [place];Living will [place];Living will  Does patient want to make changes to medical advance directive? No - Patient declined No - Patient declined  Copy of [place] in Chart? No - copy requested No - copy requested    Hospital Utilization Over the Past 12 Months: # of hospitalizations or ER visits: 0 # of surgeries: 0  Review of Systems    Patient reports that his overall health is worse compared to last year.  History obtained from chart review  Patient Reported Readings (BP, Pulse, CBG, Weight, etc) none  Pain Assessment Pain : No/denies pain      Current Medications & Allergies (verified) Allergies as of 01/25/2020       Reactions   Amoxicillin    Other reaction(s): HIVES        Medication List        Accurate as of January 25, 2020  2:24 PM. If you have any questions, ask your nurse or doctor.          baclofen 10 MG tablet Commonly known as: LIORESAL Take 0.5 tablets (5 mg total) by mouth 3 (three) times daily.   cholecalciferol 1000 units tablet Commonly known as: VITAMIN D Take 1,000 Units by mouth daily.   donepezil 5 MG tablet Commonly known as: Aricept Take 1 tablet (5 mg total) by mouth at bedtime.   DULoxetine 60 MG capsule Commonly known as: CYMBALTA Take 60 mg by mouth daily.   gabapentin 300 MG capsule Commonly known as: NEURONTIN Take 4 capsules by mouth 3 (three) times daily.   Magonate 500 (27 Mg) MG Tabs Generic drug: Magnesium Gluconate Take 500 mg by mouth.   memantine 5 MG tablet Commonly known as: Namenda Take 1 tablet (5 mg total) by mouth 2 (two) times daily.   metoprolol succinate 25 MG 24 hr tablet Commonly known as: TOPROL-XL Take 25 mg by mouth daily.   multivitamin tablet Take 1 tablet by mouth daily.   VITAMIN B-12 PO Take by mouth daily.   warfarin 2 MG tablet Commonly known as: COUMADIN Take as directed by the anticoagulation clinic. If you are unsure how to take this medication, talk to your nurse or doctor. 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    Venous insufficiency    Past Surgical History:  Procedure Laterality Date   BELOW KNEE LEG AMPUTATION Right    EYE SURGERY Bilateral    cataracts   HIP SURGERY Right    TONSILLECTOMY AND ADENOIDECTOMY     Family History  Problem Relation Age of  Onset   Stroke Mother    Social History   Socioeconomic History   Marital status: Married    Spouse name: [name]   Number of children: 2   Years of education: 16   Highest education level: Master's degree (e.g., MA, MS, MEng, MEd, MSW, MBA)  Occupational History   Occupation: Retired  Tobacco Use   Smoking status: Former Smoker    Quit date: 06/15/1959    Years since quitting: 60.6   Smokeless tobacco: Never Used  Scientific laboratory technician Use: Never used  Substance and Sexual Activity   Alcohol use: No    Alcohol/week: 0.0 standard drinks   Drug use: No   Sexual activity: Not Currently  Other Topics Concern   Not on file  Social History Narrative   Patient lives 6 months here and 6 months in [place] Determinants of Health   Financial Resource Strain: Low Risk    Difficulty of Paying Living Expenses: Not hard at all  Food Insecurity: No Food Insecurity   Worried About Charity fundraiser in the Last Year: Never true   Arboriculturist in the Last Year: Never true  Transportation Needs: No Transportation Needs   Lack of Transportation (Medical): No   Lack of Transportation (Non-Medical): No  Physical Activity: Inactive   Days of Exercise per Week: 0 days   Minutes of Exercise per Session: 0 min  Stress: No Stress Concern Present   Feeling of Stress : Not at all  Social Connections: Socially Integrated   Frequency of Communication with Friends and Family: More than three times a week   Frequency of Social Gatherings with Friends and Family: More than three times a week   Attends Religious Services: More than 4 times per year   Active Member of [employer] or Organizations: Yes   Attends Archivist Meetings: More than 4 times per year   Marital Status: Married    Activities of Daily Living In your present state of health, do you have any difficulty performing the following activities: 01/25/2020  Hearing? N  Vision? N  Difficulty concentrating  or making decisions? Y  Comment memory is getting worse-takes Namenda and Aricept  Walking or climbing stairs? Y  Comment due to BKA of right leg  Dressing or bathing? Y  Comment has an Aid that comes in and helps with bathing  Doing errands, shopping? Y  Comment his wife [name] drives him to all appointments and to do all errands  Conservation officer, nature and eating ? N  Using the Toilet? N  In the past six months, have you accidently leaked urine? Y  Comment has to wear depends  Do you have problems with loss of bowel control? N  Managing your Medications? Darreld Mclean  Comment his wife takes care of all of his medications  Managing your Finances? Y  Comment wife takes care of most of the finances  Housekeeping or managing your Housekeeping? Y  Comment his wife does most of the housework and they have someone that comes in and helps clean  Some recent data might be hidden    Patient Education/ Literacy How often do you need to have someone help you when you read instructions, pamphlets, or other written materials from your doctor or pharmacy?: 1 - Never What is the last grade level you completed in school?: Masters Degree  Exercise Current Exercise Habits: The patient does not participate in regular exercise at present, Exercise limited by: orthopedic condition(s);cardiac condition(s)  Diet Patient reports consuming 3 meals a day and 1 snack(s) a day Patient reports that his primary diet is: Regular Patient reports that she does have regular access to food.   Depression Screen PHQ 2/9 Scores 01/25/2020 01/21/2020 12/03/2019 11/19/2019 09/30/2019 08/31/2019 07/06/2019  PHQ - 2 Score 0 0 0 1 0 0 0     Fall Risk Fall Risk  01/25/2020 01/21/2020 12/03/2019 11/19/2019 09/30/2019  Falls in the past year? 0 0 0 0 0  Number falls in past yr: - - - - -  Comment - - - - -  Injury with Fall? - - - - -  Comment - - - - -  Risk for fall due to : - - - - -  Risk for fall due to: Comment - - - - -  Follow up - -  - - -     Objective:  Brett Pearson. seemed alert and oriented and he participated appropriately during our telephone visit.  Blood Pressure Weight BMI  BP Readings from Last 3 Encounters:  01/21/20 (!) 98/58  12/03/19 99/60  11/19/19 (!) 96/56   Wt Readings from Last 3 Encounters:  01/21/20 211 lb 2 oz (95.8 kg)  12/03/19 208 lb (94.3 kg)  11/19/19 211 lb (95.7 kg)   BMI Readings from Last 1 Encounters:  01/21/20 29.45 kg/m    *Unable to obtain current vital signs, weight, and BMI due to telephone visit type  Hearing/Vision  Jeneen Rinks did not seem to have difficulty with hearing/understanding during the telephone conversation Reports that he has had a formal eye exam by an eye care professional within the past year Reports that he has not had a formal hearing evaluation within the past year *Unable to fully assess hearing and vision during telephone visit type  Cognitive Function: 6CIT Screen 01/25/2020 01/07/2019  What Year? 0 points 0 points  What month? 0 points 0 points  What time? 0 points 0 points  Count back from 20 0 points 0 points  Months in reverse 0 points 0 points  Repeat phrase 4 points 0 points  Total Score 4 0   (Normal:0-7, Significant for Dysfunction: >8)  Normal Cognitive Function Screening: Yes   Immunization & Health Maintenance Record Immunization History  Administered Date(s) Administered   Fluad Quad(high Dose 65+) 04/19/2019   Hep A / Hep B 03/13/2017, 04/15/2017, 10/14/2017   Influenza, High Dose Seasonal PF 05/14/2014, 06/06/2015, 05/09/2016, 05/12/2017, 05/12/2017, 05/03/2018, 05/03/2018   Influenza-Unspecified 06/03/2005, 06/06/2012, 04/26/2015, 06/08/2015, 05/06/2016, 05/21/2017   Pneumococcal Conjugate-13 02/09/2015   Pneumococcal Polysaccharide-23 05/29/2006   Td 11/15/2004   Tdap 06/18/2011   Zoster 08/06/2008   Zoster Recombinat (Shingrix) 04/17/2018, 04/17/2018    Health Maintenance  Topic Date Due   COVID-19 Vaccine (  1)  04/22/2020 (Originally 02/03/1953)   INFLUENZA VACCINE  03/06/2020   TETANUS/TDAP  06/17/2021   PNA vac Low Risk Adult  Completed   Hepatitis C Screening  Discontinued       Assessment  This is a routine wellness examination for Brett Pearson.Marland Kitchen  Health Maintenance: Due or Overdue There are no preventive care reminders to display for this patient.  Brett Pearson. does not need a referral for Community Assistance: Care Management:   no Social Work:    no Prescription Assistance:  no Nutrition/Diabetes Education:  no   Plan:  Personalized Goals Goals Addressed             This Visit's Progress    DIET - INCREASE WATER INTAKE       Try to drink 6-8 glasses of water daily       Personalized Health Maintenance & Screening Recommendations  He is up to date on all his recommended health screenings  Lung Cancer Screening Recommended: no (Low Dose CT Chest recommended if Age 88-80 years, 30 pack-year currently smoking OR have quit w/in past 15 years) Hepatitis C Screening recommended: no HIV Screening recommended: no  Advanced Directives: Written information was not prepared per patient's request.  Referrals & Orders No orders of the defined types were placed in this encounter.   Follow-up Plan Follow-up with [name], [name], MD as planned Bring a copy of your COVID vaccine card in for our records Bring a copy of your Advanced Directives in for our records   I have personally reviewed and noted the following in the patient's chart:   Medical and social history Use of alcohol, tobacco or illicit drugs  Current medications and supplements Functional ability and status Nutritional status Physical activity Advanced directives List of other physicians Hospitalizations, surgeries, and ER visits in previous 12 months Vitals Screenings to include cognitive, depression, and falls Referrals and appointments  In addition, I have reviewed and discussed with  Brett Pearson. certain preventive protocols, quality metrics, and best practice recommendations. A written personalized care plan for preventive services as well as general preventive health recommendations is available and can be mailed to the patient at his request.      Milas Hock, LPN  [id number]    I have reviewed and agree with the above AWV documentation.   Evelina Dun, FNP

## 2020-01-25 NOTE — Patient Instructions (Signed)

## 2020-01-26 ENCOUNTER — Other Ambulatory Visit: Payer: Self-pay | Admitting: Family Medicine

## 2020-02-02 ENCOUNTER — Telehealth: Payer: Self-pay | Admitting: *Deleted

## 2020-02-02 NOTE — Telephone Encounter (Signed)
The original Home Health Referral has already been canceled. Would need new referral and also would need to include skilled nursing since it won't just cover [facility] by itself.

## 2020-02-03 NOTE — Telephone Encounter (Signed)
Can I do this referral in the previous visit that I did order we have to do a whole new face-to-face visit for this?

## 2020-02-04 NOTE — Telephone Encounter (Signed)
You should be able to do an addendum and order it in the previous visit as long as the documentation is there for the need for skilled nursing.

## 2020-02-10 ENCOUNTER — Ambulatory Visit (HOSPITAL_COMMUNITY)
Admission: RE | Admit: 2020-02-10 | Discharge: 2020-02-10 | Disposition: A | Discharge status: Home / Self Care | Payer: PPO | Source: Ambulatory Visit | Attending: Family Medicine | Admitting: Family Medicine

## 2020-02-10 ENCOUNTER — Other Ambulatory Visit: Payer: Self-pay

## 2020-02-10 DIAGNOSIS — I482 Chronic atrial fibrillation, unspecified: Secondary | ICD-10-CM

## 2020-02-10 DIAGNOSIS — I358 Other nonrheumatic aortic valve disorders: Secondary | ICD-10-CM | POA: Diagnosis not present

## 2020-02-10 NOTE — Progress Notes (Signed)
*  PRELIMINARY RESULTS* Echocardiogram 2D Echocardiogram has been performed.  Brett Pearson 02/10/2020, 4:18 PM

## 2020-02-12 ENCOUNTER — Other Ambulatory Visit: Payer: Self-pay | Admitting: Family Medicine

## 2020-02-12 DIAGNOSIS — I517 Cardiomegaly: Secondary | ICD-10-CM

## 2020-02-12 NOTE — Addendum Note (Signed)
Addended by: Caryl Pina on: 02/12/2020 09:22 AM   Modules accepted: Orders

## 2020-02-21 ENCOUNTER — Other Ambulatory Visit: Payer: Self-pay | Admitting: Family

## 2020-02-21 DIAGNOSIS — M79652 Pain in left thigh: Secondary | ICD-10-CM

## 2020-03-11 ENCOUNTER — Other Ambulatory Visit: Payer: Self-pay | Admitting: Family Medicine

## 2020-03-16 DIAGNOSIS — N390 Urinary tract infection, site not specified: Secondary | ICD-10-CM | POA: Diagnosis not present

## 2020-03-16 DIAGNOSIS — R8290 Unspecified abnormal findings in urine: Secondary | ICD-10-CM | POA: Diagnosis not present

## 2020-03-24 ENCOUNTER — Ambulatory Visit (INDEPENDENT_AMBULATORY_CARE_PROVIDER_SITE_OTHER): Payer: PPO | Admitting: Family Medicine

## 2020-03-24 ENCOUNTER — Other Ambulatory Visit: Payer: Self-pay

## 2020-03-24 ENCOUNTER — Encounter: Payer: Self-pay | Admitting: Family Medicine

## 2020-03-24 VITALS — BP 128/69 | HR 71 | Temp 98.2°F | Ht 71.0 in | Wt 215.0 lb

## 2020-03-24 DIAGNOSIS — N3945 Continuous leakage: Secondary | ICD-10-CM | POA: Diagnosis not present

## 2020-03-24 DIAGNOSIS — N390 Urinary tract infection, site not specified: Secondary | ICD-10-CM | POA: Diagnosis not present

## 2020-03-24 DIAGNOSIS — I482 Chronic atrial fibrillation, unspecified: Secondary | ICD-10-CM | POA: Diagnosis not present

## 2020-03-24 LAB — COAGUCHEK XS/INR WAIVED
INR: 1.4 — ABNORMAL HIGH (ref 0.9–1.1)
Prothrombin Time: 16.9 s

## 2020-03-24 MED ORDER — NITROFURANTOIN MONOHYD MACRO 100 MG PO CAPS: 100.0000 mg | ORAL_CAPSULE | Freq: Every day | ORAL | 1 refills | Status: DC

## 2020-03-24 NOTE — Progress Notes (Signed)
BP 128/69   Pulse 71   Temp 98.2 F (36.8 C)   Ht 5\' 11"  (1.803 m)   Wt 215 lb (97.5 kg)   SpO2 95%   BMI 29.99 kg/m    Subjective:   Patient ID: Brett Duffel., male    DOB: 24-Mar-1941, 79 y.o.   MRN: [id number]  HPI: Brett Pearson. is a 79 y.o. male presenting on 03/24/2020 for Medical Management of Chronic Issues, Atrial Fibrillation, and Urinary Incontinence   HPI Coumadin recheck Target goal: 2.0-3.0 Reason on anticoagulation: Chronic A. fib Patient denies any bruising or bleeding or chest pain or palpitations   Patient has been seeing urology for urinary leakage and drainage and recurrent UTIs, is under treatment for a recurrent UTI but they wanted to put him on a maintenance and patient does not want to take the sulfa maintenance because of the diarrhea that he gets with it.  He would like to try Macrobid as a maintenance, would back up the culture from the urology and it does show that it was effective for the Macrobid  Relevant past medical, surgical, family and social history reviewed and updated as indicated. Interim medical history since our last visit reviewed. Allergies and medications reviewed and updated.  Review of Systems  Constitutional: Negative for chills and fever.  Respiratory: Negative for shortness of breath and wheezing.   Cardiovascular: Negative for chest pain and leg swelling.  Genitourinary: Positive for dysuria, frequency and urgency.  Musculoskeletal: Negative for back pain and gait problem.  Skin: Negative for rash.  Psychiatric/Behavioral: Positive for confusion (Alzheimer memory issues, gradually worsening).  All other systems reviewed and are negative.   Per HPI unless specifically indicated above   Allergies as of 03/24/2020      Reactions   Amoxicillin    Other reaction(s): HIVES      Medication List       Accurate as of March 24, 2020  4:02 PM. If you have any questions, ask your nurse or doctor.        baclofen 10  MG tablet Commonly known as: LIORESAL TAKE 0.5 TABLETS (5 MG TOTAL) BY MOUTH 3 (THREE) TIMES DAILY.   cholecalciferol 1000 units tablet Commonly known as: VITAMIN D Take 1,000 Units by mouth daily.   donepezil 5 MG tablet Commonly known as: ARICEPT TAKE 1 TABLET BY MOUTH EVERYDAY AT BEDTIME   DULoxetine 60 MG capsule Commonly known as: CYMBALTA Take 60 mg by mouth daily.   gabapentin 300 MG capsule Commonly known as: NEURONTIN Take 4 capsules (1,200 mg total) by mouth 3 (three) times daily.   Magonate 500 (27 Mg) MG Tabs Generic drug: Magnesium Gluconate Take 500 mg by mouth.   memantine 5 MG tablet Commonly known as: Namenda Take 1 tablet (5 mg total) by mouth 2 (two) times daily.   metoprolol succinate 25 MG 24 hr tablet Commonly known as: TOPROL-XL 02/29/16 TAKE 1 TABLET BY MOUTH DAILY   multivitamin tablet Take 1 tablet by mouth daily.   VITAMIN B-12 PO Take by mouth daily.   warfarin 2 MG tablet Commonly known as: COUMADIN Take as directed by the anticoagulation clinic. If you are unsure how to take this medication, talk to your nurse or doctor. Original instructions: TAKE 4 MG (2 TABLETS) EVERY DAY EXCEPT SATURDAY 6 MG (3 TABLETS)        Objective:   BP 128/69   Pulse 71   Temp 98.2 F (36.8 C)  Ht 5\' 11"  (1.803 m)   Wt 215 lb (97.5 kg)   SpO2 95%   BMI 29.99 kg/m   Wt Readings from Last 3 Encounters:  03/24/20 215 lb (97.5 kg)  01/21/20 211 lb 2 oz (95.8 kg)  12/03/19 208 lb (94.3 kg)    Physical Exam Vitals and nursing note reviewed.  Constitutional:      General: He is not in acute distress.    Appearance: He is well-developed. He is not diaphoretic.  Eyes:     General: No scleral icterus.    Conjunctiva/sclera: Conjunctivae normal.  Neck:     Thyroid: No thyromegaly.  Cardiovascular:     Rate and Rhythm: Normal rate and regular rhythm.     Heart sounds: Normal heart sounds. No murmur heard.   Pulmonary:     Effort: Pulmonary  effort is normal. No respiratory distress.     Breath sounds: Normal breath sounds. No wheezing.  Musculoskeletal:        General: Normal range of motion.     Cervical back: Neck supple.  Lymphadenopathy:     Cervical: No cervical adenopathy.  Skin:    General: Skin is warm and dry.     Findings: No rash.  Neurological:     Mental Status: He is alert and oriented to person, place, and time.     Coordination: Coordination normal.  Psychiatric:        Behavior: Behavior normal.     Description   Take an extra 2 mg tablet today and tomorrow and then go back to his previous dose of 6 mg on Mondays and Saturdays and 4 mg the rest of the week  INR 1.4 (goal 2.0-3.0) Follow-up in 4 to 6 weeks      Assessment & Plan:   Problem List Items Addressed This Visit      Cardiovascular and Mediastinum   Chronic atrial fibrillation ([place]) - Primary   Relevant Orders   CoaguChek XS/INR Waived    Other Visit Diagnoses    Continuous leakage of urine       Relevant Orders   For home use only DME Other see comment   Recurrent UTI       Relevant Medications   nitrofurantoin, macrocrystal-monohydrate, (MACROBID) 100 MG capsule   Other Relevant Orders   For home use only DME Other see comment       Follow up plan: Return if symptoms worsen or fail to improve, for 4-week follow-up for INR.  Counseling provided for all of the vaccine components Orders Placed This Encounter  Procedures  . CoaguChek XS/INR [place], MD [facility] 03/24/2020, 4:02 PM

## 2020-03-28 ENCOUNTER — Telehealth: Payer: Self-pay | Admitting: Family Medicine

## 2020-03-28 NOTE — Telephone Encounter (Signed)
Order may need chart notes as to why catheter is needed

## 2020-03-28 NOTE — Telephone Encounter (Signed)
Order was already placed and it does say in HPI section that he has the continuous urinary leakage and recurrent UTIs and talks about it there.,  I am fine with going ahead and sending it or fax an order over

## 2020-03-29 NOTE — Telephone Encounter (Signed)
Forms received from [facility] and placed on providers desk for signature

## 2020-04-04 NOTE — Telephone Encounter (Signed)
Wife states she lost the signed order for the condom cath and is needing an rx sent to [facility]. Fax: [phone]

## 2020-04-04 NOTE — Telephone Encounter (Signed)
Documentation was faxed again today

## 2020-04-08 DIAGNOSIS — N3945 Continuous leakage: Secondary | ICD-10-CM | POA: Diagnosis not present

## 2020-04-12 ENCOUNTER — Telehealth: Payer: Self-pay | Admitting: Family Medicine

## 2020-04-12 ENCOUNTER — Encounter: Payer: Self-pay | Admitting: Cardiology

## 2020-04-12 NOTE — Telephone Encounter (Signed)
I think we had discussed the possibility of going to see a neurologist before and realistically a neurologist would be the only one that could discern between true Alzheimer's and other illnesses such as normal pressure hydrocephalus.  They would be the ones that can answer that more clearly, if they would like to go see a neurologist and we can place a referral.

## 2020-04-12 NOTE — Telephone Encounter (Signed)
Pts wife looking for another cause of Alzheimers and wanting to see if he could have "normal pressure hydrocephalus." Wants to discuss during next appt.

## 2020-04-12 NOTE — Progress Notes (Signed)
Cardiology Office Note   Date:  04/13/2020   ID:  [name], DOB 16-Mar-1941, MRN [id number]  PCP:  [name], [name], MD  Cardiologist:   No primary care provider on file. Referring:  [name], [name], MD  Chief Complaint  Patient presents with  . Atrial Fibrillation      History of Present Illness: Otho Michalik. is a 79 y.o. male who presents for follow up of chronic atrial fib.    He had this for about 12 years he thinks.  He was diagnosed when he was in [place] at the time of a leg amputation  It sounds like he was followed in [facility] for years by primary care but never needed to see a cardiologist.  He has been on  low-dose metoprolol and warfarin for years.  Recently for routine follow-up he had an echocardiogram.  He had a normal EF on echo with severe LAE.  This was done in July.      He does well.  He gets around with canes.  The patient denies any new symptoms such as chest discomfort, neck or arm discomfort. There has been no new shortness of breath, PND or orthopnea. There have been no reported palpitations, presyncope or syncope.    Past Medical History:  Diagnosis Date  . Atrial fibrillation ([place])   . Hypokalemia   . Neuropathy    bilateral feet   . 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 Venous insufficiency     Past Surgical History:  Procedure Laterality Date  . BELOW KNEE LEG AMPUTATION Right   . EYE SURGERY Bilateral    cataracts  . HIP SURGERY Right   . TONSILLECTOMY AND ADENOIDECTOMY       Current Outpatient Medications  Medication Sig Dispense Refill  . cholecalciferol (VITAMIN D) 1000 UNITS tablet Take 1,000 Units by mouth daily.    . Cyanocobalamin (VITAMIN B-12 PO) Take by mouth daily.      Marland Kitchen donepezil (ARICEPT) 5 MG tablet TAKE 1 TABLET BY MOUTH EVERYDAY AT BEDTIME 90 tablet 0  . DULoxetine (CYMBALTA) 60 MG capsule Take 60 mg by mouth daily.      Marland Kitchen gabapentin (NEURONTIN) 300 MG capsule Take 4 capsules (1,200 mg  total) by mouth 3 (three) times daily. (Patient taking differently: Take 900 mg by mouth 2 (two) times daily. ) 360 capsule 0  . Magnesium Gluconate (MAGONATE) 500 (27 Mg) MG TABS Take 500 mg by mouth.    . memantine (NAMENDA) 5 MG tablet Take 1 tablet (5 mg total) by mouth 2 (two) times daily. 180 tablet 3  . metoprolol succinate (TOPROL-XL) 25 MG 24 hr tablet 02/29/16 TAKE 1 TABLET BY MOUTH DAILY 90 tablet 0  . Multiple Vitamin (MULTIVITAMIN) tablet Take 1 tablet by mouth daily.      . nitrofurantoin, macrocrystal-monohydrate, (MACROBID) 100 MG capsule Take 1 capsule (100 mg total) by mouth daily. 1 po BId 30 capsule 1  . warfarin (COUMADIN) 2 MG tablet TAKE 4 MG (2 TABLETS) EVERY DAY EXCEPT SATURDAY 6 MG (3 TABLETS) 600 tablet 0   No current facility-administered medications for this visit.    Allergies:   Amoxicillin    Social History:  The patient  reports that he quit smoking about 60 years ago. He has never used smokeless tobacco. He reports that he does not drink alcohol and does not use drugs.   Family History:  The patient's  family history includes Parkinson's disease in his father; Stroke in his mother.    ROS:  Please see the history of present illness.   Otherwise, review of systems are positive for none.   All other systems are reviewed and negative.    PHYSICAL EXAM: VS:  BP 98/60   Pulse 62   Ht 6' (1.829 m)   Wt 212 lb (96.2 kg)   BMI 28.75 kg/m  , BMI Body mass index is 28.75 kg/m. GENERAL:  Well appearing HEENT:  Pupils equal round and reactive, fundi not visualized, oral mucosa unremarkable NECK:  No jugular venous distention, waveform within normal limits, carotid upstroke brisk and symmetric, no bruits, no thyromegaly LYMPHATICS:  No cervical, inguinal adenopathy LUNGS:  Clear to auscultation bilaterally BACK:  No CVA tenderness CHEST:  Unremarkable HEART:  PMI not displaced or sustained,S1 and S2 within normal limits, no S3, no clicks, no rubs, 2 out of 6  left upper sternal border brief systolic murmur nonradiating, no diastolic murmurs, irregular ABD:  Flat, positive bowel sounds normal in frequency in pitch, no bruits, no rebound, no guarding, no midline pulsatile mass, no hepatomegaly, no splenomegaly EXT:  2 plus pulses throughout, no edema, no cyanosis no clubbing, status post right BKA SKIN:  No rashes no nodules NEURO:  Cranial nerves II through XII grossly intact, motor grossly intact throughout PSYCH:  Cognitively intact, oriented to person place and time    EKG:  EKG is ordered today. The ekg ordered today demonstrates atrial fibrillation, rate 62, axis, intervals within normal limits no acute ST-T wave changes.   Recent Labs: 12/03/2019: ALT 14; BUN 15; Creatinine, Ser 1.07; Hemoglobin 14.0; Platelets 218; Potassium 4.5; Sodium 146; TSH 4.320    Lipid Panel    Component Value Date/Time   CHOL 141 12/03/2019 1656   TRIG 78 12/03/2019 1656   HDL 52 12/03/2019 1656   CHOLHDL 2.7 12/03/2019 1656   LDLCALC 74 12/03/2019 1656      Wt Readings from Last 3 Encounters:  04/13/20 212 lb (96.2 kg)  03/24/20 215 lb (97.5 kg)  01/21/20 211 lb 2 oz (95.8 kg)      Other studies Reviewed: Additional studies/ records that were reviewed today include:   Echo. Review of the above records demonstrates:  Please see elsewhere in the note.     ASSESSMENT AND PLAN:  CHRONIC ATRIAL FIB:    Patient tolerates this rhythm and anticoagulation.  Mr. Izaya Netherton. has a CHA2DS2 - VASc score of 2.    LAE:    This is an echo finding but not clinically significant.  At this point no change in therapy.  I did personally reviewed the images.  He does have mild mitral regurgitation but nothing clinically needs to be done about this.  MEMORY DISORDER:     His wife is worried about the rapid onset of his dementia.  I suggested a neurology consult and she would like to pursue this and we will place this.  COVID EDUCATION:  He has been  vaccinated.     Current medicines are reviewed at length with the patient today.  The patient does not have concerns regarding medicines.  The following changes have been made:  no change  Labs/ tests ordered today include: None  Orders Placed This Encounter  Procedures  . Ambulatory referral to Neurology  . EKG 12-Lead     Disposition:   FU with me as needed.      Signed, Jeneen Rinks  Zury Fazzino, MD  04/13/2020 3:23 PM    [place]

## 2020-04-13 ENCOUNTER — Encounter: Payer: Self-pay | Admitting: Cardiology

## 2020-04-13 ENCOUNTER — Other Ambulatory Visit: Payer: Self-pay

## 2020-04-13 ENCOUNTER — Ambulatory Visit: Payer: PPO | Admitting: Cardiology

## 2020-04-13 VITALS — BP 98/60 | HR 62 | Ht 72.0 in | Wt 212.0 lb

## 2020-04-13 DIAGNOSIS — Z7189 Other specified counseling: Secondary | ICD-10-CM | POA: Diagnosis not present

## 2020-04-13 DIAGNOSIS — I482 Chronic atrial fibrillation, unspecified: Secondary | ICD-10-CM | POA: Diagnosis not present

## 2020-04-13 DIAGNOSIS — R413 Other amnesia: Secondary | ICD-10-CM | POA: Diagnosis not present

## 2020-04-13 DIAGNOSIS — I10 Essential (primary) hypertension: Secondary | ICD-10-CM

## 2020-04-13 DIAGNOSIS — N3945 Continuous leakage: Secondary | ICD-10-CM | POA: Diagnosis not present

## 2020-04-13 NOTE — Telephone Encounter (Signed)
lmtcb

## 2020-04-13 NOTE — Patient Instructions (Addendum)
Medication Instructions:  The current medical regimen is effective;  continue present plan and medications.  *If you need a refill on your cardiac medications before your next appointment, please call your pharmacy*  You have been referred to Dr Wells Guiles Tat. You will be contacted to be scheduled.  Follow-Up: We recommend signing up for the patient portal called "MyChart".  Sign up information is provided on this After Visit Summary.  MyChart is used to connect with patients for Virtual Visits (Telemedicine).  Patients are able to view lab/test results, encounter notes, upcoming appointments, etc.  Non-urgent messages can be sent to your provider as well.   To learn more about what you can do with MyChart, go to [email or web address].    Follow up with Dr Percival Spanish as needed.  Thank you for choosing [facility]!!

## 2020-04-21 ENCOUNTER — Other Ambulatory Visit: Payer: Self-pay

## 2020-04-21 ENCOUNTER — Ambulatory Visit (INDEPENDENT_AMBULATORY_CARE_PROVIDER_SITE_OTHER): Payer: PPO | Admitting: Family Medicine

## 2020-04-21 ENCOUNTER — Encounter: Payer: Self-pay | Admitting: Family Medicine

## 2020-04-21 VITALS — BP 115/70 | HR 62 | Temp 98.0°F | Ht 72.0 in | Wt 210.0 lb

## 2020-04-21 DIAGNOSIS — I482 Chronic atrial fibrillation, unspecified: Secondary | ICD-10-CM

## 2020-04-21 DIAGNOSIS — Z23 Encounter for immunization: Secondary | ICD-10-CM

## 2020-04-21 DIAGNOSIS — N390 Urinary tract infection, site not specified: Secondary | ICD-10-CM

## 2020-04-21 LAB — COAGUCHEK XS/INR WAIVED
INR: 1.8 — ABNORMAL HIGH (ref 0.9–1.1)
Prothrombin Time: 21.7 s

## 2020-04-21 NOTE — Progress Notes (Signed)
BP 115/70   Pulse 62   Temp 98 F (36.7 C)   Ht 6' (1.829 m)   Wt 210 lb (95.3 kg)   SpO2 98%   BMI 28.48 kg/m    Subjective:   Patient ID: Brett Duffel., male    DOB: 03-05-1941, 79 y.o.   MRN: [id number]  HPI: Brett Spinney. is a 79 y.o. male presenting on 04/21/2020 for Medical Management of Chronic Issues and Atrial Fibrillation   HPI Coumadin recheck Target goal: 2.0-3.0 Reason on anticoagulation: A. fib Patient denies any bruising or bleeding or chest pain or palpitations   Relevant past medical, surgical, family and social history reviewed and updated as indicated. Interim medical history since our last visit reviewed. Allergies and medications reviewed and updated.  Review of Systems  Constitutional: Negative for chills and fever.  Respiratory: Negative for shortness of breath and wheezing.   Cardiovascular: Negative for chest pain and leg swelling.  Skin: Negative for rash.  Neurological: Negative for dizziness and light-headedness.  All other systems reviewed and are negative.   Per HPI unless specifically indicated above   Allergies as of 04/21/2020      Reactions   Amoxicillin    Other reaction(s): HIVES      Medication List       Accurate as of April 21, 2020  3:35 PM. If you have any questions, ask your nurse or doctor.        cholecalciferol 1000 units tablet Commonly known as: VITAMIN D Take 1,000 Units by mouth daily.   donepezil 5 MG tablet Commonly known as: ARICEPT TAKE 1 TABLET BY MOUTH EVERYDAY AT BEDTIME   DULoxetine 60 MG capsule Commonly known as: CYMBALTA Take 60 mg by mouth daily.   gabapentin 300 MG capsule Commonly known as: NEURONTIN Take 4 capsules (1,200 mg total) by mouth 3 (three) times daily. What changed:   how much to take  when to take this   Magonate 500 (27 Mg) MG Tabs Generic drug: Magnesium Gluconate Take 500 mg by mouth.   memantine 5 MG tablet Commonly known as: Namenda Take 1 tablet  (5 mg total) by mouth 2 (two) times daily.   metoprolol succinate 25 MG 24 hr tablet Commonly known as: TOPROL-XL 02/29/16 TAKE 1 TABLET BY MOUTH DAILY   multivitamin tablet Take 1 tablet by mouth daily.   nitrofurantoin (macrocrystal-monohydrate) 100 MG capsule Commonly known as: Macrobid Take 1 capsule (100 mg total) by mouth daily. 1 po BId   VITAMIN B-12 PO Take by mouth daily.   warfarin 2 MG tablet Commonly known as: COUMADIN Take as directed by the anticoagulation clinic. If you are unsure how to take this medication, talk to your nurse or doctor. Original instructions: TAKE 4 MG (2 TABLETS) EVERY DAY EXCEPT SATURDAY 6 MG (3 TABLETS)        Objective:   BP 115/70   Pulse 62   Temp 98 F (36.7 C)   Ht 6' (1.829 m)   Wt 210 lb (95.3 kg)   SpO2 98%   BMI 28.48 kg/m   Wt Readings from Last 3 Encounters:  04/21/20 210 lb (95.3 kg)  04/13/20 212 lb (96.2 kg)  03/24/20 215 lb (97.5 kg)    Physical Exam Vitals and nursing note reviewed.  Constitutional:      General: He is not in acute distress.    Appearance: He is well-developed. He is not diaphoretic.  Eyes:     General:  No scleral icterus.    Conjunctiva/sclera: Conjunctivae normal.  Neck:     Thyroid: No thyromegaly.  Cardiovascular:     Rate and Rhythm: Normal rate. Rhythm irregular.     Heart sounds: Normal heart sounds. No murmur heard.   Pulmonary:     Effort: Pulmonary effort is normal. No respiratory distress.     Breath sounds: Normal breath sounds. No wheezing.  Musculoskeletal:        General: Normal range of motion.     Cervical back: Neck supple.  Lymphadenopathy:     Cervical: No cervical adenopathy.  Skin:    General: Skin is warm and dry.     Findings: No rash.  Neurological:     Mental Status: He is alert and oriented to person, place, and time.     Coordination: Coordination normal.  Psychiatric:        Behavior: Behavior normal.     Description   Increase to 6 mg on  Mondays and Saturdays and 4 mg the rest of the week  INR 1.8 (goal 2.0-3.0) Follow-up in 4 to 6 weeks      Assessment & Plan:   Problem List Items Addressed This Visit      Cardiovascular and Mediastinum   Chronic atrial fibrillation (Brett Pearson) - Primary   Relevant Orders   CoaguChek XS/INR Waived (Completed)    Other Visit Diagnoses    Flu vaccine need       Relevant Orders   Flu Vaccine QUAD High Dose(Fluad) (Completed)   Frequent UTI       Relevant Orders   Urinalysis, Complete   Urine Culture      Will do urinalysis for patient, continue current Coumadin levels with minor adjustments according to the plan. Follow up plan: Return if symptoms worsen or fail to improve, for 4 to 6-week INR recheck.  Counseling provided for all of the vaccine components Orders Placed This Encounter  Procedures  . CoaguChek XS/INR [place], MD [facility] 04/21/2020, 3:35 PM

## 2020-04-28 ENCOUNTER — Telehealth: Payer: Self-pay | Admitting: Family Medicine

## 2020-04-28 NOTE — Telephone Encounter (Signed)
I do not understand, she saying that she does not think it actually happened because his dementia and he may be just remembering something a long time ago or is he actually having blood in the toilet when he urinates.  Please let me know.

## 2020-04-29 NOTE — Telephone Encounter (Signed)
Know I agree, monitor it and let us know.

## 2020-04-29 NOTE — Telephone Encounter (Signed)
Spoke to pt's wife and she says she thinks he might have been confused due to his dementia. Pt's wife states she hasn't noticed any blood in the collection bag as pt has a condom catheter and it is very unusual for him to actually go to the toilet to urinate as he is incontinent. Advised her to monitor his urine and to notify us of any urinary changes such as color, amount, odor or pain. Pt's wife voiced understanding.    Is there anything else you would recommend?

## 2020-04-29 NOTE — Telephone Encounter (Signed)
noted 

## 2020-05-12 ENCOUNTER — Telehealth: Payer: Self-pay

## 2020-05-12 DIAGNOSIS — N3945 Continuous leakage: Secondary | ICD-10-CM | POA: Diagnosis not present

## 2020-05-12 NOTE — Telephone Encounter (Signed)
Wife informed and understood.

## 2020-05-12 NOTE — Telephone Encounter (Signed)
Spoke to patients' wife she states that the patient took about 3 weeks of the nitrofurantion and has had diarrhea almost 2 weeks - urine is clear. She also wanted to let you know that she has cut his Gabapentin to 600 mg bid and stopped the elavil - patient seems "brighter", not sleeping through the day, and eating more. Patient's confusion is still increasing.

## 2020-05-12 NOTE — Telephone Encounter (Signed)
Okay that sounds good to stop the Macrobid, he will have to discuss this more with the urologist and see what other options they may want, I am good with the gabapentin being lower than stopping the Elavil.  We will continue to monitor from there.

## 2020-05-12 NOTE — Telephone Encounter (Signed)
Any antibiotic can cause diarrhea, how many days of it did he take?

## 2020-06-07 ENCOUNTER — Other Ambulatory Visit: Payer: Self-pay | Admitting: Family Medicine

## 2020-06-09 ENCOUNTER — Other Ambulatory Visit: Payer: Self-pay

## 2020-06-09 ENCOUNTER — Encounter: Payer: Self-pay | Admitting: Family Medicine

## 2020-06-09 ENCOUNTER — Ambulatory Visit (INDEPENDENT_AMBULATORY_CARE_PROVIDER_SITE_OTHER): Payer: PPO | Admitting: Family Medicine

## 2020-06-09 VITALS — BP 123/72 | HR 70 | Temp 97.4°F | Ht 72.0 in | Wt 210.8 lb

## 2020-06-09 DIAGNOSIS — I482 Chronic atrial fibrillation, unspecified: Secondary | ICD-10-CM

## 2020-06-09 DIAGNOSIS — R350 Frequency of micturition: Secondary | ICD-10-CM

## 2020-06-09 LAB — URINALYSIS, COMPLETE
Bilirubin, UA: NEGATIVE
Glucose, UA: NEGATIVE
Nitrite, UA: NEGATIVE
Protein,UA: NEGATIVE
Specific Gravity, UA: 1.025 (ref 1.005–1.030)
Urobilinogen, Ur: 0.2 mg/dL (ref 0.2–1.0)
pH, UA: 5 (ref 5.0–7.5)

## 2020-06-09 LAB — MICROSCOPIC EXAMINATION
RBC, Urine: NONE SEEN /hpf (ref 0–2)
WBC, UA: >30 /hpf — ABNORMAL HIGH (ref 0–5)

## 2020-06-09 NOTE — Progress Notes (Signed)
BP 123/72   Pulse 70   Temp (!) 97.4 F (36.3 C) (Temporal)   Ht 6' (1.829 m)   Wt 210 lb 12.8 oz (95.6 kg)   BMI 28.59 kg/m    Subjective:   Patient ID: Brett Duffel., male    DOB: [birth year], 79 y.o.   MRN: [id number]  HPI: Brett Jost. is a 79 y.o. male presenting on 06/09/2020 for Atrial Fibrillation and Urinary Frequency   HPI Coumadin recheck Target goal: 2.0-3.0 Reason on anticoagulation: Chronic A. fib Patient denies any bruising or bleeding or chest pain or palpitations   Patient uses a condom catheter and does have some urinary frequency and wants to have his urine tested.  He denies any fevers or chills or hematuria.  Relevant past medical, surgical, family and social history reviewed and updated as indicated. Interim medical history since our last visit reviewed. Allergies and medications reviewed and updated.  Review of Systems  Constitutional: Negative for chills and fever.  Eyes: Negative for discharge.  Respiratory: Negative for shortness of breath and wheezing.   Cardiovascular: Negative for chest pain and leg swelling.  Genitourinary: Positive for frequency. Negative for difficulty urinating, dysuria and hematuria.  Musculoskeletal: Negative for back pain and gait problem.  Skin: Negative for rash.  All other systems reviewed and are negative.   Per HPI unless specifically indicated above   Allergies as of 06/09/2020      Reactions   Amoxicillin    Other reaction(s): HIVES      Medication List       Accurate as of June 09, 2020  3:39 PM. If you have any questions, ask your nurse or doctor.        cholecalciferol 1000 units tablet Commonly known as: VITAMIN D Take 1,000 Units by mouth daily.   donepezil 5 MG tablet Commonly known as: ARICEPT TAKE 1 TABLET BY MOUTH EVERYDAY AT BEDTIME   DULoxetine 60 MG capsule Commonly known as: CYMBALTA Take 60 mg by mouth daily.   gabapentin 300 MG capsule Commonly known as:  NEURONTIN Take 4 capsules (1,200 mg total) by mouth 3 (three) times daily. What changed:   how much to take  when to take this   Magonate 500 (27 Mg) MG Tabs Generic drug: Magnesium Gluconate Take 500 mg by mouth.   memantine 5 MG tablet Commonly known as: Namenda Take 1 tablet (5 mg total) by mouth 2 (two) times daily.   metoprolol succinate 25 MG 24 hr tablet Commonly known as: TOPROL-XL 02/29/16 TAKE 1 TABLET BY MOUTH DAILY   multivitamin tablet Take 1 tablet by mouth daily.   nitrofurantoin (macrocrystal-monohydrate) 100 MG capsule Commonly known as: Macrobid Take 1 capsule (100 mg total) by mouth daily. 1 po BId   VITAMIN B-12 PO Take by mouth daily.   warfarin 2 MG tablet Commonly known as: COUMADIN Take as directed by the anticoagulation clinic. If you are unsure how to take this medication, talk to your nurse or doctor. Original instructions: TAKE 4 MG (2 TABLETS) EVERY DAY EXCEPT SATURDAY 6 MG (3 TABLETS)        Objective:   BP 123/72   Pulse 70   Temp (!) 97.4 F (36.3 C) (Temporal)   Ht 6' (1.829 m)   Wt 210 lb 12.8 oz (95.6 kg)   BMI 28.59 kg/m   Wt Readings from Last 3 Encounters:  06/09/20 210 lb 12.8 oz (95.6 kg)  04/21/20 210 lb (95.3 kg)  04/13/20 212 lb (96.2 kg)    Physical Exam Vitals and nursing note reviewed.  Constitutional:      General: He is not in acute distress.    Appearance: He is well-developed. He is not diaphoretic.  Eyes:     General: No scleral icterus.    Conjunctiva/sclera: Conjunctivae normal.  Neck:     Thyroid: No thyromegaly.  Cardiovascular:     Rate and Rhythm: Normal rate. Rhythm irregular.     Heart sounds: Normal heart sounds. No murmur heard.   Pulmonary:     Effort: Pulmonary effort is normal. No respiratory distress.     Breath sounds: Normal breath sounds. No wheezing.  Musculoskeletal:        General: Normal range of motion.     Cervical back: Neck supple.  Lymphadenopathy:     Cervical: No  cervical adenopathy.  Skin:    General: Skin is warm and dry.     Findings: No rash.  Neurological:     Mental Status: He is alert and oriented to person, place, and time.     Coordination: Coordination normal.  Psychiatric:        Behavior: Behavior normal.       Assessment & Plan:   Problem List Items Addressed This Visit      Cardiovascular and Mediastinum   Chronic atrial fibrillation ([place]) - Primary    Other Visit Diagnoses    Urinary frequency       Relevant Orders   Urine Culture   Urinalysis, Complete      Description   Continue taking 6 mg on Mondays and Saturdays and 4 mg the rest of the week  INR 2.0 (goal 2.0-3.0) Follow-up in 4 to 6 weeks     Follow up plan: Return if symptoms worsen or fail to improve, for 6 week chronic med check and inr.  Counseling provided for all of the vaccine components Orders Placed This Encounter  Procedures  . Urine Culture  . Urinalysis, Complete    Caryl Pina, MD [facility] 06/09/2020, 3:39 PM

## 2020-06-09 NOTE — Addendum Note (Signed)
Addended by: Antonietta Barcelona D on: 06/09/2020 04:38 PM   Modules accepted: Orders

## 2020-06-10 LAB — COAGUCHEK XS/INR WAIVED
INR: 2 — ABNORMAL HIGH (ref 0.9–1.1)
Prothrombin Time: 23.6 s

## 2020-06-12 LAB — URINE CULTURE

## 2020-06-15 DIAGNOSIS — N3945 Continuous leakage: Secondary | ICD-10-CM | POA: Diagnosis not present

## 2020-07-19 DIAGNOSIS — N3945 Continuous leakage: Secondary | ICD-10-CM | POA: Diagnosis not present

## 2020-07-21 ENCOUNTER — Encounter: Payer: Self-pay | Admitting: Family Medicine

## 2020-07-21 ENCOUNTER — Ambulatory Visit (INDEPENDENT_AMBULATORY_CARE_PROVIDER_SITE_OTHER): Payer: PPO | Admitting: Family Medicine

## 2020-07-21 ENCOUNTER — Other Ambulatory Visit: Payer: Self-pay

## 2020-07-21 VITALS — BP 99/59 | HR 60 | Temp 96.5°F | Ht 72.0 in | Wt 202.0 lb

## 2020-07-21 DIAGNOSIS — R350 Frequency of micturition: Secondary | ICD-10-CM | POA: Diagnosis not present

## 2020-07-21 DIAGNOSIS — I482 Chronic atrial fibrillation, unspecified: Secondary | ICD-10-CM

## 2020-07-21 LAB — URINALYSIS, COMPLETE
Bilirubin, UA: NEGATIVE
Glucose, UA: NEGATIVE
Nitrite, UA: NEGATIVE
Specific Gravity, UA: 1.025 (ref 1.005–1.030)
Urobilinogen, Ur: 0.2 mg/dL (ref 0.2–1.0)
pH, UA: 5 (ref 5.0–7.5)

## 2020-07-21 LAB — MICROSCOPIC EXAMINATION: WBC, UA: >30 /hpf — AB (ref 0–5)

## 2020-07-21 LAB — COAGUCHEK XS/INR WAIVED
INR: 1.8 — ABNORMAL HIGH (ref 0.9–1.1)
Prothrombin Time: 21.2 s

## 2020-07-21 NOTE — Progress Notes (Signed)
BP (!) 99/59   Pulse 60   Temp (!) 96.5 F (35.8 C)   Ht 6' (1.829 m)   Wt 202 lb (91.6 kg)   SpO2 98%   BMI 27.40 kg/m    Subjective:   Patient ID: Brett Duffel., male    DOB: 1940/11/19, 79 y.o.   MRN: [id number]  HPI: Brett Colocho. is a 79 y.o. male presenting on 07/21/2020 for Medical Management of Chronic Issues and Atrial Fibrillation   HPI Coumadin recheck Target goal: 2.0-3.0 Reason on anticoagulation: afib Patient denies any bruising or bleeding or chest pain or palpitations   Patient does not eat as much except when in social situations and his memory is decreasing  Urinary frequency is still a common thing  Relevant past medical, surgical, family and social history reviewed and updated as indicated. Interim medical history since our last visit reviewed. Allergies and medications reviewed and updated.  Review of Systems  Constitutional: Negative for chills and fever.  Respiratory: Negative for shortness of breath and wheezing.   Cardiovascular: Negative for chest pain and leg swelling.  Musculoskeletal: Negative for back pain and gait problem.  Skin: Negative for rash.  Psychiatric/Behavioral: Positive for confusion.  All other systems reviewed and are negative.   Per HPI unless specifically indicated above   Allergies as of 07/21/2020      Reactions   Amoxicillin    Other reaction(s): HIVES      Medication List       Accurate as of July 21, 2020  3:21 PM. If you have any questions, ask your nurse or doctor.        STOP taking these medications   DULoxetine 60 MG capsule Commonly known as: CYMBALTA Stopped by: [name] Oshen Wlodarczyk, MD   nitrofurantoin (macrocrystal-monohydrate) 100 MG capsule Commonly known as: Macrobid Stopped by: Worthy Rancher, MD     TAKE these medications   cholecalciferol 1000 units tablet Commonly known as: VITAMIN D Take 1,000 Units by mouth daily.   donepezil 5 MG tablet Commonly known as:  ARICEPT TAKE 1 TABLET BY MOUTH EVERYDAY AT BEDTIME   gabapentin 300 MG capsule Commonly known as: NEURONTIN Take 4 capsules (1,200 mg total) by mouth 3 (three) times daily. What changed:   how much to take  when to take this   Magnesium Gluconate 500 (27 Mg) MG Tabs Take 500 mg by mouth.   memantine 5 MG tablet Commonly known as: Namenda Take 1 tablet (5 mg total) by mouth 2 (two) times daily.   metoprolol succinate 25 MG 24 hr tablet Commonly known as: TOPROL-XL 02/29/16 TAKE 1 TABLET BY MOUTH DAILY   multivitamin tablet Take 1 tablet by mouth daily.   VITAMIN B-12 PO Take by mouth daily.   warfarin 2 MG tablet Commonly known as: COUMADIN Take as directed by the anticoagulation clinic. If you are unsure how to take this medication, talk to your nurse or doctor. Original instructions: TAKE 4 MG (2 TABLETS) EVERY DAY EXCEPT SATURDAY 6 MG (3 TABLETS)        Objective:   BP (!) 99/59   Pulse 60   Temp (!) 96.5 F (35.8 C)   Ht 6' (1.829 m)   Wt 202 lb (91.6 kg)   SpO2 98%   BMI 27.40 kg/m   Wt Readings from Last 3 Encounters:  07/21/20 202 lb (91.6 kg)  06/09/20 210 lb 12.8 oz (95.6 kg)  04/21/20 210 lb (95.3 kg)  Physical Exam Vitals and nursing note reviewed.  Constitutional:      General: He is not in acute distress.    Appearance: He is obese.  Neurological:     Mental Status: He is alert.       Assessment & Plan:   Problem List Items Addressed This Visit      Cardiovascular and Mediastinum   Chronic atrial fibrillation ([place]) - Primary   Relevant Orders   CoaguChek XS/INR Waived    Other Visit Diagnoses    Urinary frequency       Relevant Orders   Urinalysis, Complete   Urine Culture       Follow up plan: Return if symptoms worsen or fail to improve, for 6-8 weeks inr.  Counseling provided for all of the vaccine components Orders Placed This Encounter  Procedures  . Urine Culture  . CoaguChek XS/INR Waived  . Urinalysis,  Complete    Caryl Pina, MD [facility] 07/21/2020, 3:21 PM

## 2020-07-27 LAB — URINE CULTURE

## 2020-08-04 ENCOUNTER — Telehealth: Payer: Self-pay | Admitting: Family Medicine

## 2020-08-04 MED ORDER — CEFDINIR 300 MG PO CAPS: 300.0000 mg | ORAL_CAPSULE | Freq: Two times a day (BID) | ORAL | 0 refills | Status: DC

## 2020-08-04 NOTE — Telephone Encounter (Signed)
Patient aware and verbalized understanding. °

## 2020-08-04 NOTE — Telephone Encounter (Signed)
Patient's urine culture did grow a type of bacteria that was insufficient quantities called Klebsiella, sent Omnicef for him.

## 2020-08-15 DIAGNOSIS — H43813 Vitreous degeneration, bilateral: Secondary | ICD-10-CM | POA: Diagnosis not present

## 2020-08-15 DIAGNOSIS — H5203 Hypermetropia, bilateral: Secondary | ICD-10-CM | POA: Diagnosis not present

## 2020-08-15 DIAGNOSIS — Z961 Presence of intraocular lens: Secondary | ICD-10-CM | POA: Diagnosis not present

## 2020-08-15 DIAGNOSIS — H16223 Keratoconjunctivitis sicca, not specified as Sjogren's, bilateral: Secondary | ICD-10-CM | POA: Diagnosis not present

## 2020-08-15 DIAGNOSIS — H02831 Dermatochalasis of right upper eyelid: Secondary | ICD-10-CM | POA: Diagnosis not present

## 2020-08-15 DIAGNOSIS — H02834 Dermatochalasis of left upper eyelid: Secondary | ICD-10-CM | POA: Diagnosis not present

## 2020-08-15 DIAGNOSIS — H52203 Unspecified astigmatism, bilateral: Secondary | ICD-10-CM | POA: Diagnosis not present

## 2020-08-15 DIAGNOSIS — H524 Presbyopia: Secondary | ICD-10-CM | POA: Diagnosis not present

## 2020-08-19 DIAGNOSIS — N3945 Continuous leakage: Secondary | ICD-10-CM | POA: Diagnosis not present

## 2020-08-29 ENCOUNTER — Other Ambulatory Visit: Payer: Self-pay | Admitting: Family Medicine

## 2020-09-01 ENCOUNTER — Encounter: Payer: Self-pay | Admitting: Family Medicine

## 2020-09-01 ENCOUNTER — Other Ambulatory Visit: Payer: Self-pay

## 2020-09-01 ENCOUNTER — Ambulatory Visit (INDEPENDENT_AMBULATORY_CARE_PROVIDER_SITE_OTHER): Payer: PPO | Admitting: Family Medicine

## 2020-09-01 VITALS — BP 112/67 | HR 57 | Ht 72.0 in | Wt 204.0 lb

## 2020-09-01 DIAGNOSIS — I482 Chronic atrial fibrillation, unspecified: Secondary | ICD-10-CM

## 2020-09-01 DIAGNOSIS — N390 Urinary tract infection, site not specified: Secondary | ICD-10-CM | POA: Diagnosis not present

## 2020-09-01 LAB — MICROSCOPIC EXAMINATION: Epithelial Cells (non renal): NONE SEEN /hpf (ref 0–10)

## 2020-09-01 LAB — URINALYSIS, COMPLETE
Bilirubin, UA: NEGATIVE
Glucose, UA: NEGATIVE
Ketones, UA: NEGATIVE
Protein,UA: NEGATIVE
Specific Gravity, UA: 1.02 (ref 1.005–1.030)
Urobilinogen, Ur: 0.2 mg/dL (ref 0.2–1.0)
pH, UA: 6 (ref 5.0–7.5)

## 2020-09-01 LAB — COAGUCHEK XS/INR WAIVED
INR: 1.7 — ABNORMAL HIGH (ref 0.9–1.1)
Prothrombin Time: 20.5 s

## 2020-09-01 NOTE — Progress Notes (Signed)
BP 112/67   Pulse (!) 57   Ht 6' (1.829 m)   Wt 204 lb (92.5 kg)   SpO2 99%   BMI 27.67 kg/m    Subjective:   Patient ID: Brett Duffel., male    DOB: [birth year], 80 y.o.   MRN: [id number]  HPI: Brett Hissong. is a 80 y.o. male presenting on 09/01/2020 for Medical Management of Chronic Issues and Atrial Fibrillation   HPI Coumadin recheck Target goal: 2.0-3.0 Reason on anticoagulation: Chronic A. fib Patient denies any bruising or bleeding or chest pain or palpitations   Patient has recurrent UTIs, he does not have any symptoms currently but they wanted to get a urine checked because he has had issues with it recurrently.  He does still have confusion but he does have dementia and she says is gradually worsening but cannot tell if it is acutely worsened.  Relevant past medical, surgical, family and social history reviewed and updated as indicated. Interim medical history since our last visit reviewed. Allergies and medications reviewed and updated.  Review of Systems  Constitutional: Negative for chills and fever.  Eyes: Negative for visual disturbance.  Respiratory: Negative for shortness of breath and wheezing.   Cardiovascular: Negative for chest pain and leg swelling.  Musculoskeletal: Negative for back pain and gait problem.  Skin: Negative for rash.  Neurological: Negative for dizziness, weakness and light-headedness.  All other systems reviewed and are negative.   Per HPI unless specifically indicated above   Allergies as of 09/01/2020      Reactions   Amoxicillin    Other reaction(s): HIVES      Medication List       Accurate as of September 01, 2020  2:29 PM. If you have any questions, ask your nurse or doctor.        STOP taking these medications   cefdinir 300 MG capsule Commonly known as: OMNICEF Stopped by: [name] Takiera Mayo, MD     TAKE these medications   cholecalciferol 1000 units tablet Commonly known as: VITAMIN D Take 1,000 Units  by mouth daily.   donepezil 5 MG tablet Commonly known as: ARICEPT TAKE 1 TABLET BY MOUTH EVERYDAY AT BEDTIME   gabapentin 300 MG capsule Commonly known as: NEURONTIN Take 4 capsules (1,200 mg total) by mouth 3 (three) times daily. What changed:   how much to take  when to take this   Magnesium Gluconate 500 (27 Mg) MG Tabs Take 500 mg by mouth.   memantine 5 MG tablet Commonly known as: Namenda Take 1 tablet (5 mg total) by mouth 2 (two) times daily.   metoprolol succinate 25 MG 24 hr tablet Commonly known as: TOPROL-XL 02/29/16 TAKE 1 TABLET BY MOUTH DAILY   multivitamin tablet Take 1 tablet by mouth daily.   VITAMIN B-12 PO Take by mouth daily.   warfarin 2 MG tablet Commonly known as: COUMADIN Take as directed by the anticoagulation clinic. If you are unsure how to take this medication, talk to your nurse or doctor. Original instructions: TAKE 4 MG (2 TABLETS) EVERY DAY EXCEPT SATURDAY 6 MG (3 TABLETS)        Objective:   BP 112/67   Pulse (!) 57   Ht 6' (1.829 m)   Wt 204 lb (92.5 kg)   SpO2 99%   BMI 27.67 kg/m   Wt Readings from Last 3 Encounters:  09/01/20 204 lb (92.5 kg)  07/21/20 202 lb (91.6 kg)  06/09/20 210  lb 12.8 oz (95.6 kg)    Physical Exam Vitals and nursing note reviewed.  Constitutional:      General: He is not in acute distress.    Appearance: He is well-developed and well-nourished. He is not diaphoretic.  Eyes:     General: No scleral icterus.    Extraocular Movements: EOM normal.     Conjunctiva/sclera: Conjunctivae normal.  Neck:     Thyroid: No thyromegaly.  Cardiovascular:     Pulses: Intact distal pulses.  Musculoskeletal:        General: No edema.  Neurological:     Mental Status: He is alert and oriented to person, place, and time.     Coordination: Coordination normal.  Psychiatric:        Mood and Affect: Mood and affect normal.        Behavior: Behavior normal.       Assessment & Plan:   Problem List  Items Addressed This Visit      Cardiovascular and Mediastinum   Chronic atrial fibrillation ([place]) - Primary   Relevant Orders   CoaguChek XS/INR Waived (Completed)    Other Visit Diagnoses    Recurrent UTI       Relevant Orders   Urinalysis, Complete (Completed)   Urine Culture      Will await culture for urine, urinalysis does show greater than 30 WBCs but he does self catheterize but is hard to tell, will await culture.  Description   Increase to be taking 6 mg on Mondays, Wednesdays, Thursdays Saturdays and 4 mg the rest of the week  INR 1.7(goal 2.0-3.0) Follow-up in 4 to 6 weeks     Follow up plan: Return if symptoms worsen or fail to improve, for 6 to 8-week, physical exam..  Counseling provided for all of the vaccine components Orders Placed This Encounter  Procedures  . Urine Culture  . CoaguChek XS/INR Waived  . Urinalysis, Complete    Caryl Pina, MD [facility] 09/01/2020, 2:29 PM

## 2020-09-03 LAB — URINE CULTURE

## 2020-09-27 ENCOUNTER — Telehealth: Payer: Self-pay

## 2020-09-27 NOTE — Telephone Encounter (Signed)
LMOVM for patient, order will need a F2F visit for documentation, he has an appt with Dr. Warrick Parisian near the end of March, if he is unable to wait for that appt, we may need to make appt with another provider, and for him to call the office back to make the appointment if needed.

## 2020-10-01 DIAGNOSIS — T1490XA Injury, unspecified, initial encounter: Secondary | ICD-10-CM | POA: Diagnosis not present

## 2020-10-01 DIAGNOSIS — W19XXXA Unspecified fall, initial encounter: Secondary | ICD-10-CM | POA: Diagnosis not present

## 2020-10-05 ENCOUNTER — Other Ambulatory Visit: Payer: Self-pay

## 2020-10-05 MED ORDER — GABAPENTIN 300 MG PO CAPS: 900.0000 mg | ORAL_CAPSULE | Freq: Two times a day (BID) | ORAL | 3 refills | Status: DC

## 2020-10-05 NOTE — Telephone Encounter (Signed)
LMOVM refill sent to pharmacy 

## 2020-10-05 NOTE — Telephone Encounter (Signed)
  Prescription Request  10/05/2020  What is the name of the medication or equipment? Gabapentin 300 mg - Patient is out  Have you contacted your pharmacy to request a refill? (if applicable) NO  Which pharmacy would you like this sent to? [facility] in [place]   Patient notified that their request is being sent to the clinical staff for review and that they should receive a response within 2 business days.

## 2020-10-10 ENCOUNTER — Telehealth: Payer: Self-pay

## 2020-10-10 ENCOUNTER — Other Ambulatory Visit: Payer: Self-pay | Admitting: Family Medicine

## 2020-10-10 DIAGNOSIS — Z7901 Long term (current) use of anticoagulants: Secondary | ICD-10-CM

## 2020-10-10 DIAGNOSIS — I482 Chronic atrial fibrillation, unspecified: Secondary | ICD-10-CM

## 2020-10-11 DIAGNOSIS — N3945 Continuous leakage: Secondary | ICD-10-CM | POA: Diagnosis not present

## 2020-10-12 NOTE — Telephone Encounter (Signed)
Form received, placed on providers desk.  

## 2020-10-14 NOTE — Telephone Encounter (Signed)
Aware form faxed today

## 2020-10-27 ENCOUNTER — Ambulatory Visit (INDEPENDENT_AMBULATORY_CARE_PROVIDER_SITE_OTHER): Payer: PPO | Admitting: Family Medicine

## 2020-10-27 ENCOUNTER — Encounter: Payer: Self-pay | Admitting: Family Medicine

## 2020-10-27 ENCOUNTER — Other Ambulatory Visit: Payer: Self-pay

## 2020-10-27 VITALS — BP 122/61 | HR 58 | Ht 72.0 in | Wt 201.4 lb

## 2020-10-27 DIAGNOSIS — R413 Other amnesia: Secondary | ICD-10-CM

## 2020-10-27 DIAGNOSIS — I482 Chronic atrial fibrillation, unspecified: Secondary | ICD-10-CM

## 2020-10-27 DIAGNOSIS — Z87440 Personal history of urinary (tract) infections: Secondary | ICD-10-CM | POA: Diagnosis not present

## 2020-10-27 DIAGNOSIS — R29898 Other symptoms and signs involving the musculoskeletal system: Secondary | ICD-10-CM | POA: Diagnosis not present

## 2020-10-27 DIAGNOSIS — I10 Essential (primary) hypertension: Secondary | ICD-10-CM

## 2020-10-27 DIAGNOSIS — Z13220 Encounter for screening for lipoid disorders: Secondary | ICD-10-CM | POA: Diagnosis not present

## 2020-10-27 DIAGNOSIS — S88111S Complete traumatic amputation at level between knee and ankle, right lower leg, sequela: Secondary | ICD-10-CM

## 2020-10-27 DIAGNOSIS — S88111A Complete traumatic amputation at level between knee and ankle, right lower leg, initial encounter: Secondary | ICD-10-CM

## 2020-10-27 LAB — URINALYSIS, COMPLETE
Bilirubin, UA: NEGATIVE
Glucose, UA: NEGATIVE
Nitrite, UA: POSITIVE — AB
Protein,UA: NEGATIVE
Specific Gravity, UA: 1.025 (ref 1.005–1.030)
Urobilinogen, Ur: 0.2 mg/dL (ref 0.2–1.0)
pH, UA: 5 (ref 5.0–7.5)

## 2020-10-27 LAB — MICROSCOPIC EXAMINATION: RBC, Urine: NONE SEEN /hpf (ref 0–2)

## 2020-10-27 LAB — COAGUCHEK XS/INR WAIVED
INR: 1.7 — ABNORMAL HIGH (ref 0.9–1.1)
Prothrombin Time: 20.6 s

## 2020-10-27 NOTE — Progress Notes (Signed)
BP 122/61   Pulse (!) 58   Ht 6' (1.829 m)   Wt 201 lb 6 oz (91.3 kg)   SpO2 97%   BMI 27.31 kg/m    Subjective:   Patient ID: Brett Duffel., male    DOB: [birth year], 80 y.o.   MRN: [id number]  HPI: Brett Otten. is a 80 y.o. male presenting on 10/27/2020 for Medical Management of Chronic Issues and Atrial Fibrillation   HPI Coumadin recheck Target goal: 2.0-3.0 Reason on anticoagulation: Chronic A. fib Patient denies any bruising or bleeding or chest pain or palpitations   Hypertension Patient is currently on metoprolol, and their blood pressure today is 122/61. Patient denies any lightheadedness or dizziness. Patient denies headaches, blurred vision, chest pains, shortness of breath, or weakness. Denies any side effects from medication and is content with current medication.   Memory loss and Alzheimer's Patient is currently on donepezil for memory loss and Alzheimer's.  He also takes Namenda.  They are helping some but he still having increasingly worsening memory issues gradually.  He has started not eating as much and losing some weight.  His memory is still worsening and she has not noticed a lot of benefit from these 2 medicines but wants to continue them for now and try longer.  Patient has chronic lower extremity weakness and has braces and canes for his legs.  He has been progressively weaker and uses a wheelchair often.  He needs to get a new wheelchair because the arm on his wheelchair is breaking off.  Relevant past medical, surgical, family and social history reviewed and updated as indicated. Interim medical history since our last visit reviewed. Allergies and medications reviewed and updated.  Review of Systems  Constitutional: Negative for chills and fever.  Eyes: Negative for discharge.  Respiratory: Negative for shortness of breath and wheezing.   Cardiovascular: Negative for chest pain and leg swelling.  Musculoskeletal: Positive for gait problem.  Negative for back pain.  Skin: Negative for rash.  Neurological: Positive for weakness.  All other systems reviewed and are negative.   Per HPI unless specifically indicated above   Allergies as of 10/27/2020      Reactions   Amoxicillin    Other reaction(s): HIVES      Medication List       Accurate as of October 27, 2020  3:06 PM. If you have any questions, ask your nurse or doctor.        cholecalciferol 1000 units tablet Commonly known as: VITAMIN D Take 1,000 Units by mouth daily.   donepezil 5 MG tablet Commonly known as: ARICEPT TAKE 1 TABLET BY MOUTH EVERYDAY AT BEDTIME   gabapentin 300 MG capsule Commonly known as: NEURONTIN Take 3 capsules (900 mg total) by mouth 2 (two) times daily.   Magnesium Gluconate 500 (27 Mg) MG Tabs Take 500 mg by mouth.   memantine 5 MG tablet Commonly known as: Namenda Take 1 tablet (5 mg total) by mouth 2 (two) times daily.   metoprolol succinate 25 MG 24 hr tablet Commonly known as: TOPROL-XL 02/29/16 TAKE 1 TABLET BY MOUTH DAILY   multivitamin tablet Take 1 tablet by mouth daily.   VITAMIN B-12 PO Take by mouth daily.   warfarin 2 MG tablet Commonly known as: COUMADIN Take as directed by the anticoagulation clinic. If you are unsure how to take this medication, talk to your nurse or doctor. Original instructions: TAKE 4 MG (2 TABLETS) EVERY DAY EXCEPT  SATURDAY 6 MG (3 TABLETS)        Objective:   BP 122/61   Pulse (!) 58   Ht 6' (1.829 m)   Wt 201 lb 6 oz (91.3 kg)   SpO2 97%   BMI 27.31 kg/m   Wt Readings from Last 3 Encounters:  10/27/20 201 lb 6 oz (91.3 kg)  09/01/20 204 lb (92.5 kg)  07/21/20 202 lb (91.6 kg)    Physical Exam Vitals and nursing note reviewed.  Constitutional:      General: He is not in acute distress.    Appearance: He is well-developed. He is not diaphoretic.  Eyes:     General: No scleral icterus.    Conjunctiva/sclera: Conjunctivae normal.  Neck:     Thyroid: No  thyromegaly.  Cardiovascular:     Rate and Rhythm: Normal rate. Rhythm irregular.     Heart sounds: Normal heart sounds. No murmur heard.   Pulmonary:     Effort: Pulmonary effort is normal. No respiratory distress.     Breath sounds: Normal breath sounds. No wheezing.  Musculoskeletal:     Cervical back: Neck supple.     Comments: Patient has arm crutches for both sides and still has difficulty walking in here and has been increasingly more difficult for him.  Lymphadenopathy:     Cervical: No cervical adenopathy.  Skin:    General: Skin is warm and dry.     Findings: No rash.  Neurological:     Mental Status: He is alert and oriented to person, place, and time.     Coordination: Coordination normal.  Psychiatric:        Behavior: Behavior normal.       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  Counseling provided for all of the vaccine components Orders Placed This Encounter  Procedures  . CoaguChek XS/INR Waived  . CBC with Differential/Platelet  . CMP14+EGFR  .  Lipid panel    Caryl Pina, MD [facility] 10/27/2020, 3:06 PM

## 2020-10-28 LAB — CMP14+EGFR
ALT: 9 IU/L (ref 0–44)
AST: 21 IU/L (ref 0–40)
Albumin/Globulin Ratio: 1.3 (ref 1.2–2.2)
Albumin: 4.1 g/dL (ref 3.7–4.7)
Alkaline Phosphatase: 72 IU/L (ref 44–121)
BUN/Creatinine Ratio: 15 (ref 10–24)
BUN: 17 mg/dL (ref 8–27)
Bilirubin Total: 0.7 mg/dL (ref 0.0–1.2)
CO2: 21 mmol/L (ref 20–29)
Calcium: 9.6 mg/dL (ref 8.6–10.2)
Chloride: 106 mmol/L (ref 96–106)
Creatinine, Ser: 1.13 mg/dL (ref 0.76–1.27)
Globulin, Total: 3.1 g/dL (ref 1.5–4.5)
Glucose: 123 mg/dL — ABNORMAL HIGH (ref 65–99)
Potassium: 4.6 mmol/L (ref 3.5–5.2)
Sodium: 144 mmol/L (ref 134–144)
Total Protein: 7.2 g/dL (ref 6.0–8.5)
eGFR: 66 mL/min/{1.73_m2} (ref >59)

## 2020-10-28 LAB — CBC WITH DIFFERENTIAL/PLATELET
Basophils Absolute: 0 10*3/uL (ref 0.0–0.2)
Basos: 1 %
EOS (ABSOLUTE): 0.3 10*3/uL (ref 0.0–0.4)
Eos: 3 %
Hematocrit: 39.2 % (ref 37.5–51.0)
Hemoglobin: 13.6 g/dL (ref 13.0–17.7)
Immature Grans (Abs): 0 10*3/uL (ref 0.0–0.1)
Immature Granulocytes: 0 %
Lymphocytes Absolute: 2.3 10*3/uL (ref 0.7–3.1)
Lymphs: 29 %
MCH: 34.1 pg — ABNORMAL HIGH (ref 26.6–33.0)
MCHC: 34.7 g/dL (ref 31.5–35.7)
MCV: 98 fL — ABNORMAL HIGH (ref 79–97)
Monocytes Absolute: 0.7 10*3/uL (ref 0.1–0.9)
Monocytes: 9 %
Neutrophils Absolute: 4.6 10*3/uL (ref 1.4–7.0)
Neutrophils: 58 %
Platelets: 185 10*3/uL (ref 150–450)
RBC: 3.99 x10E6/uL — ABNORMAL LOW (ref 4.14–5.80)
RDW: 13 % (ref 11.6–15.4)
WBC: 7.9 10*3/uL (ref 3.4–10.8)

## 2020-10-28 LAB — LIPID PANEL
Chol/HDL Ratio: 2.7 ratio (ref 0.0–5.0)
Cholesterol, Total: 130 mg/dL (ref 100–199)
HDL: 49 mg/dL (ref >39)
LDL Chol Calc (NIH): 65 mg/dL (ref 0–99)
Triglycerides: 79 mg/dL (ref 0–149)
VLDL Cholesterol Cal: 16 mg/dL (ref 5–40)

## 2020-11-01 LAB — URINE CULTURE

## 2020-11-02 ENCOUNTER — Telehealth: Payer: Self-pay | Admitting: Family Medicine

## 2020-11-02 MED ORDER — CEPHALEXIN 500 MG PO CAPS
500.0000 mg | ORAL_CAPSULE | Freq: Four times a day (QID) | ORAL | 0 refills | Status: DC
Start: 2020-11-02 — End: 2020-12-12

## 2020-11-02 NOTE — Telephone Encounter (Signed)
Patient's urine culture grew again, this may be chronic, I think he should discuss this with his urologist but I did send Keflex, his urologist may say we do not need to treat this every time because it is chronic. Caryl Pina, MD [facility] 11/02/2020, 9:20 AM

## 2020-11-02 NOTE — Telephone Encounter (Signed)
Left message informing pt of all. If has any concerns to please call the office back.

## 2020-11-14 DIAGNOSIS — N3945 Continuous leakage: Secondary | ICD-10-CM | POA: Diagnosis not present

## 2020-12-03 ENCOUNTER — Other Ambulatory Visit: Payer: Self-pay | Admitting: Family Medicine

## 2020-12-11 DIAGNOSIS — R531 Weakness: Secondary | ICD-10-CM | POA: Diagnosis not present

## 2020-12-11 DIAGNOSIS — R5381 Other malaise: Secondary | ICD-10-CM | POA: Diagnosis not present

## 2020-12-12 ENCOUNTER — Ambulatory Visit (INDEPENDENT_AMBULATORY_CARE_PROVIDER_SITE_OTHER): Payer: PPO | Admitting: Family Medicine

## 2020-12-12 ENCOUNTER — Encounter: Payer: Self-pay | Admitting: Family Medicine

## 2020-12-12 ENCOUNTER — Other Ambulatory Visit: Payer: Self-pay

## 2020-12-12 VITALS — BP 121/70 | HR 60 | Temp 97.8°F | Ht 72.0 in | Wt 201.2 lb

## 2020-12-12 DIAGNOSIS — I482 Chronic atrial fibrillation, unspecified: Secondary | ICD-10-CM

## 2020-12-12 LAB — COAGUCHEK XS/INR WAIVED
INR: 4.2 — ABNORMAL HIGH (ref 0.9–1.1)
Prothrombin Time: 49.8 s

## 2020-12-12 NOTE — Progress Notes (Signed)
BP 121/70   Pulse 60   Temp 97.8 F (36.6 C) (Temporal)   Ht 6' (1.829 m)   Wt 201 lb 4 oz (91.3 kg)   BMI 27.29 kg/m    Subjective:   Patient ID: Brett Duffel., male    DOB: [birth year], 80 y.o.   MRN: [id number]  HPI: Brett Sherk. is a 80 y.o. male presenting on 12/12/2020 for Atrial Fibrillation   HPI Coumadin recheck Target goal: 2.0-3.0 Reason on anticoagulation: A. fib  Patient denies any bruising or bleeding or chest pain or palpitations   Relevant past medical, surgical, family and social history reviewed and updated as indicated. Interim medical history since our last visit reviewed. Allergies and medications reviewed and updated.  Review of Systems  Constitutional: Negative for chills and fever.  Respiratory: Negative for shortness of breath and wheezing.   Cardiovascular: Negative for chest pain and leg swelling.  Musculoskeletal: Negative for back pain and gait problem.  Skin: Negative for rash.  All other systems reviewed and are negative.   Per HPI unless specifically indicated above   Allergies as of 12/12/2020      Reactions   Amoxicillin    Other reaction(s): HIVES      Medication List       Accurate as of Dec 12, 2020  2:27 PM. If you have any questions, ask your nurse or doctor.        STOP taking these medications   cephALEXin 500 MG capsule Commonly known as: KEFLEX Stopped by: [name] Reida Hem, MD     TAKE these medications   cholecalciferol 1000 units tablet Commonly known as: VITAMIN D Take 1,000 Units by mouth daily.   donepezil 5 MG tablet Commonly known as: ARICEPT TAKE 1 TABLET BY MOUTH EVERYDAY AT BEDTIME   gabapentin 300 MG capsule Commonly known as: NEURONTIN Take 3 capsules (900 mg total) by mouth 2 (two) times daily.   Magnesium Gluconate 500 (27 Mg) MG Tabs Take 500 mg by mouth.   memantine 5 MG tablet Commonly known as: Namenda Take 1 tablet (5 mg total) by mouth 2 (two) times daily.   metoprolol  succinate 25 MG 24 hr tablet Commonly known as: TOPROL-XL 02/29/16 TAKE 1 TABLET BY MOUTH DAILY   multivitamin tablet Take 1 tablet by mouth daily.   VITAMIN B-12 PO Take by mouth daily.   warfarin 2 MG tablet Commonly known as: COUMADIN Take as directed by the anticoagulation clinic. If you are unsure how to take this medication, talk to your nurse or doctor. Original instructions: TAKE 4 MG (2 TABLETS) EVERY DAY EXCEPT SATURDAY 6 MG (3 TABLETS)        Objective:   BP 121/70   Pulse 60   Temp 97.8 F (36.6 C) (Temporal)   Ht 6' (1.829 m)   Wt 201 lb 4 oz (91.3 kg)   BMI 27.29 kg/m   Wt Readings from Last 3 Encounters:  12/12/20 201 lb 4 oz (91.3 kg)  10/27/20 201 lb 6 oz (91.3 kg)  09/01/20 204 lb (92.5 kg)    Physical Exam Vitals and nursing note reviewed.  Constitutional:      Appearance: Normal appearance.  Skin:    General: Skin is warm and dry.     Findings: No bruising.  Neurological:     Mental Status: He is alert.       Assessment & Plan:   Problem List Items Addressed This Visit  Cardiovascular and Mediastinum   Chronic atrial fibrillation ([place]) - Primary   Relevant Orders   CoaguChek XS/INR Waived      Description   Decrease, hold today and to be taking 6 mg on every day except Mondays and Fridays, take 4 mg on Monday and Friday  INR 4.2(goal 2.0-3.0) Follow-up in 3-4 weeks     Follow up plan: Return if symptoms worsen or fail to improve, for 3 to 4-week inr.  Counseling provided for all of the vaccine components Orders Placed This Encounter  Procedures  . CoaguChek XS/INR [place], MD [facility] 12/12/2020, 2:27 PM

## 2020-12-15 DIAGNOSIS — N3945 Continuous leakage: Secondary | ICD-10-CM | POA: Diagnosis not present

## 2021-01-02 ENCOUNTER — Other Ambulatory Visit: Payer: Self-pay | Admitting: Family Medicine

## 2021-01-02 DIAGNOSIS — R413 Other amnesia: Secondary | ICD-10-CM

## 2021-01-04 ENCOUNTER — Other Ambulatory Visit: Payer: Self-pay

## 2021-01-04 ENCOUNTER — Telehealth: Payer: Self-pay | Admitting: Family Medicine

## 2021-01-04 ENCOUNTER — Encounter: Payer: Self-pay | Admitting: Family Medicine

## 2021-01-04 ENCOUNTER — Ambulatory Visit (INDEPENDENT_AMBULATORY_CARE_PROVIDER_SITE_OTHER): Payer: PPO | Admitting: Family Medicine

## 2021-01-04 VITALS — BP 122/77 | HR 80 | Temp 97.2°F | Ht 72.0 in | Wt 194.2 lb

## 2021-01-04 DIAGNOSIS — I482 Chronic atrial fibrillation, unspecified: Secondary | ICD-10-CM | POA: Diagnosis not present

## 2021-01-04 LAB — COAGUCHEK XS/INR WAIVED
INR: 2.4 — ABNORMAL HIGH (ref 0.9–1.1)
Prothrombin Time: 29.1 s

## 2021-01-04 NOTE — Progress Notes (Signed)
BP 122/77   Pulse 80   Temp (!) 97.2 F (36.2 C)   Ht 6' (1.829 m)   Wt 194 lb 3.2 oz (88.1 kg)   SpO2 97%   BMI 26.34 kg/m    Subjective:   Patient ID: Brett Duffel., male    DOB: 12/28/1940, 80 y.o.   MRN: [id number]  HPI: Brett Vandehei. is a 79 y.o. male presenting on 01/04/2021 for Atrial Fibrillation   HPI  Coumadin recheck Target goal: 2.0-3.0 Reason on anticoagulation: Chronic A. fib Patient denies any bruising or bleeding or chest pain or palpitations   Relevant past medical, surgical, family and social history reviewed and updated as indicated. Interim medical history since our last visit reviewed. Allergies and medications reviewed and updated.  Review of Systems  Constitutional: Negative for chills and fever.  Respiratory: Negative for shortness of breath and wheezing.   Cardiovascular: Negative for chest pain and leg swelling.  Gastrointestinal: Negative for blood in stool.  Genitourinary: Negative for hematuria.  Musculoskeletal: Negative for back pain and gait problem.  Skin: Negative for rash.  Neurological: Negative for dizziness and light-headedness.  All other systems reviewed and are negative.   Per HPI unless specifically indicated above   Allergies as of 01/04/2021      Reactions   Amoxicillin    Other reaction(s): HIVES      Medication List       Accurate as of January 04, 2021 11:43 AM. If you have any questions, ask your nurse or doctor.        cholecalciferol 1000 units tablet Commonly known as: VITAMIN D Take 1,000 Units by mouth daily.   donepezil 5 MG tablet Commonly known as: ARICEPT TAKE 1 TABLET BY MOUTH EVERYDAY AT BEDTIME   gabapentin 300 MG capsule Commonly known as: NEURONTIN Take 3 capsules (900 mg total) by mouth 2 (two) times daily.   Magnesium Gluconate 500 (27 Mg) MG Tabs Take 500 mg by mouth.   memantine 5 MG tablet Commonly known as: NAMENDA TAKE 1 TABLET BY MOUTH TWICE A DAY   metoprolol succinate 25  MG 24 hr tablet Commonly known as: TOPROL-XL 02/29/16 TAKE 1 TABLET BY MOUTH DAILY   multivitamin tablet Take 1 tablet by mouth daily.   VITAMIN B-12 PO Take by mouth daily.   warfarin 2 MG tablet Commonly known as: COUMADIN Take as directed by the anticoagulation clinic. If you are unsure how to take this medication, talk to your nurse or doctor. Original instructions: TAKE 4 MG (2 TABLETS) EVERY DAY EXCEPT SATURDAY 6 MG (3 TABLETS)        Objective:   BP 122/77   Pulse 80   Temp (!) 97.2 F (36.2 C)   Ht 6' (1.829 m)   Wt 194 lb 3.2 oz (88.1 kg)   SpO2 97%   BMI 26.34 kg/m   Wt Readings from Last 3 Encounters:  01/04/21 194 lb 3.2 oz (88.1 kg)  12/12/20 201 lb 4 oz (91.3 kg)  10/27/20 201 lb 6 oz (91.3 kg)    Physical Exam Vitals and nursing note reviewed.  Constitutional:      General: He is not in acute distress.    Appearance: He is well-developed. He is not diaphoretic.  Eyes:     General: No scleral icterus.    Conjunctiva/sclera: Conjunctivae normal.  Neck:     Thyroid: No thyromegaly.  Skin:    General: Skin is warm and dry.  Findings: No rash.  Neurological:     Mental Status: He is alert and oriented to person, place, and time.     Coordination: Coordination normal.  Psychiatric:        Behavior: Behavior normal.       Assessment & Plan:   Problem List Items Addressed This Visit      Cardiovascular and Mediastinum   Chronic atrial fibrillation ([place]) - Primary   Relevant Orders   CoaguChek XS/INR Waived      Description   Continue taking 6 mg on every day except Mondays and Fridays, take 4 mg on Monday and Friday  INR 2.4(goal 2.0-3.0) Follow-up in 6-8 weeks     Follow up plan: Return if symptoms worsen or fail to improve, for 6 to 8-week INR.  Counseling provided for all of the vaccine components Orders Placed This Encounter  Procedures  . CoaguChek XS/INR [place], MD [facility] 01/04/2021, 11:43 AM

## 2021-01-04 NOTE — Telephone Encounter (Signed)
I am okay with her coming to be a patient of mine even though I am not normally taking patients at this point but let her know that I am booked out for probably more than a month and it sounds like she needs to be seen sooner, if she wanted to establish with one of my colleagues who has more availability she could get in a lot sooner and establish care with them as we have many good providers here in the office but if she wants to be with me then we can schedule her out when possible but it does sound like she might need to be seen sooner

## 2021-01-05 NOTE — Telephone Encounter (Signed)
Brett Pearson called and appt made

## 2021-01-12 ENCOUNTER — Other Ambulatory Visit: Payer: Self-pay | Admitting: Family Medicine

## 2021-01-12 DIAGNOSIS — Z7901 Long term (current) use of anticoagulants: Secondary | ICD-10-CM

## 2021-01-12 DIAGNOSIS — I482 Chronic atrial fibrillation, unspecified: Secondary | ICD-10-CM

## 2021-01-25 ENCOUNTER — Ambulatory Visit (INDEPENDENT_AMBULATORY_CARE_PROVIDER_SITE_OTHER): Payer: PPO

## 2021-01-25 ENCOUNTER — Other Ambulatory Visit: Payer: Self-pay

## 2021-01-25 ENCOUNTER — Telehealth: Payer: Self-pay | Admitting: *Deleted

## 2021-01-25 ENCOUNTER — Telehealth: Payer: Self-pay

## 2021-01-25 VITALS — Ht 72.0 in | Wt 194.0 lb

## 2021-01-25 DIAGNOSIS — R413 Other amnesia: Secondary | ICD-10-CM | POA: Diagnosis not present

## 2021-01-25 DIAGNOSIS — Z0001 Encounter for general adult medical examination with abnormal findings: Secondary | ICD-10-CM

## 2021-01-25 DIAGNOSIS — Z741 Need for assistance with personal care: Secondary | ICD-10-CM

## 2021-01-25 DIAGNOSIS — M1460 Charcot's joint, unspecified site: Secondary | ICD-10-CM | POA: Insufficient documentation

## 2021-01-25 DIAGNOSIS — R3981 Functional urinary incontinence: Secondary | ICD-10-CM | POA: Diagnosis not present

## 2021-01-25 DIAGNOSIS — K7469 Other cirrhosis of liver: Secondary | ICD-10-CM

## 2021-01-25 DIAGNOSIS — K224 Dyskinesia of esophagus: Secondary | ICD-10-CM | POA: Diagnosis not present

## 2021-01-25 DIAGNOSIS — Z0000 Encounter for general adult medical examination without abnormal findings: Secondary | ICD-10-CM

## 2021-01-25 DIAGNOSIS — J849 Interstitial pulmonary disease, unspecified: Secondary | ICD-10-CM

## 2021-01-25 DIAGNOSIS — Z79899 Other long term (current) drug therapy: Secondary | ICD-10-CM

## 2021-01-25 DIAGNOSIS — I482 Chronic atrial fibrillation, unspecified: Secondary | ICD-10-CM

## 2021-01-25 NOTE — Telephone Encounter (Signed)
Patients wife called concerned that the patient may have ingested all of the medications in his weekly planner.  The biggest concern is heparin and metoprolol.  I consulted Dr. Warrick Parisian and he recommended an INR lab draw.  Order was placed and I advised caregiver to bring patient by for lab draw.

## 2021-01-25 NOTE — Chronic Care Management (AMB) (Signed)
  Chronic Care Management   Outreach Note  01/25/2021 Name: Conley Delisle. MRN: [id number] DOB: 07-May-1941  [name] is a 80 y.o. year old male who is a primary care patient of [name], [name], MD. I reached out to [name] by phone today in response to a referral sent by Mr. RAMONE GANDER Jr.'s AWV Nurse Adalberto Cole, LPN      An unsuccessful telephone outreach was attempted today. The patient was referred to the case management team for assistance with care management and care coordination.   Follow Up Plan: A HIPAA compliant phone message was left for the patient providing contact information and requesting a return call.  The care management team will reach out to the patient again over the next 2 days.  If patient returns call to provider office, please advise to call [facility] at [place], [place], [place],  [address] Direct Dial: [phone] [email or web address]: [email or web address]

## 2021-01-25 NOTE — Telephone Encounter (Signed)
There are 2 options going forward, 1 we can help her place him in a facility which would be an [place] 2 form or 2 we can try for home health care which we would need an either in face or video visit to order

## 2021-01-25 NOTE — Chronic Care Management (AMB) (Signed)
  Chronic Care Management   Note  01/25/2021 Name: Brett Pearson. MRN: [id number] DOB: November 12, 1940  Brett Pearson. is a 80 y.o. year old male who is a primary care patient of [name], [name], MD. I reached out to Brett Pearson. by phone today in response to a referral sent by Brett Pearson patient's AWV (annual wellness visit) nurse, Amy Hopkins, LPN.      [name] was given information about Chronic Care Management services today including:  CCM service includes personalized support from designated clinical staff supervised by his physician, including individualized plan of care and coordination with other care providers 24/7 contact phone numbers for assistance for urgent and routine care needs. Service will only be billed when office clinical staff spend 20 minutes or more in a month to coordinate care. Only one practitioner may furnish and bill the service in a calendar month. The patient may stop CCM services at any time (effective at the end of the month) by phone call to the office staff. The patient will be responsible for cost sharing (co-pay) of up to 20% of the service fee (after annual deductible is met).  Patient's spouse agreed to services and verbal consent obtained.   Follow up plan: Telephone appointment with care management team member scheduled for: 01/26/2021  Noreene Larsson, [place], [place], [place] [address] Direct Dial: [phone] [email or web address] Website: [email or web address]

## 2021-01-25 NOTE — Progress Notes (Signed)
Subjective:   Brett Pearson. is a 80 y.o. male who presents for Medicare Annual/Subsequent preventive examination.  Virtual Visit via Telephone Note  I connected with  Brett Pearson. on 01/25/21 at  1:15 PM EDT by telephone and verified that I am speaking with the correct person using two identifiers.  Location: Patient: Home Provider: [facility] Persons participating in the virtual visit: patient, wife, Conservator, museum/gallery   I discussed the limitations, risks, security and privacy concerns of performing an evaluation and management service by telephone and the availability of in person appointments. The patient expressed understanding and agreed to proceed.  Interactive audio and video telecommunications were attempted between this nurse and patient, however failed, due to patient having technical difficulties OR patient did not have access to video capability.  We continued and completed visit with audio only.  Some vital signs may be absent or patient reported.   Brett Pearson Brett [name], LPN   Review of Systems     Cardiac Risk Factors include: advanced age (>70men, >56 women);male gender;hypertension;sedentary lifestyle     Objective:    Today's Vitals   01/25/21 1323 01/25/21 1324  Weight: 194 lb (88 kg)   Height: 6' (1.829 m)   PainSc:  5    Body mass index is 26.31 kg/m.  Advanced Directives 01/25/2021 01/25/2020 01/07/2019  Does Patient Have a Medical Advance Directive? Yes Yes Yes  Type of Paramedic of [name];Living will [name];Living will [name];Living will  Does patient want to make changes to medical advance directive? - No - Patient declined No - Patient declined  Copy of [place] in Chart? No - copy requested No - copy requested No - copy requested    Current Medications (verified) Outpatient Encounter Medications as of 01/25/2021  Medication Sig   cholecalciferol  (VITAMIN D) 1000 UNITS tablet Take 1,000 Units by mouth daily.   Cyanocobalamin (VITAMIN B-12 PO) Take by mouth daily.   donepezil (ARICEPT) 5 MG tablet TAKE 1 TABLET BY MOUTH EVERYDAY AT BEDTIME   gabapentin (NEURONTIN) 300 MG capsule Take 3 capsules (900 mg total) by mouth 2 (two) times daily.   Magnesium Gluconate 500 (27 Mg) MG TABS Take 500 mg by mouth.   memantine (NAMENDA) 5 MG tablet TAKE 1 TABLET BY MOUTH TWICE A DAY   metoprolol succinate (TOPROL-XL) 25 MG 24 hr tablet 02/29/16 TAKE 1 TABLET BY MOUTH DAILY   Multiple Vitamin (MULTIVITAMIN) tablet Take 1 tablet by mouth daily.   warfarin (COUMADIN) 2 MG tablet TAKE 4 MG (2 TABLETS) EVERY DAY EXCEPT SATURDAY 6 MG (3 TABLETS)   No facility-administered encounter medications on file as of 01/25/2021.    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    Venous insufficiency    Past Surgical History:  Procedure Laterality Date   BELOW KNEE LEG AMPUTATION Right    EYE SURGERY Bilateral    cataracts   HIP SURGERY Right    TONSILLECTOMY AND ADENOIDECTOMY     Family History  Problem Relation Age of Onset   Stroke Mother    Parkinson's disease Father    Social History   Socioeconomic History   Marital status: Married    Spouse name: Brett Pearson  Number of children: 2   Years of education: 16   Highest education level: Master's degree (Brett.g., MA, MS, MEng, MEd, MSW, MBA)  Occupational History   Occupation: Retired  Tobacco Use   Smoking status: Former    Pack years: 0.00    Types: Cigarettes    Quit date: 06/15/1959    Years since quitting: 61.6   Smokeless tobacco: Never  Vaping Use   Vaping Use: Never used  Substance and Sexual Activity   Alcohol use: No    Alcohol/week: 0.0 standard drinks   Drug use: No   Sexual activity: Not Currently  Other  Topics Concern   Not on file  Social History Narrative   Lives with wife.    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     Is having increased confusion, memory loss - wife's health is also declining - she is looking to find some assistance with his ADLs       Social Determinants of Health   Financial Resource Strain: Low Risk    Difficulty of Paying Living Expenses: Not hard at all  Food Insecurity: No Food Insecurity   Worried About Charity fundraiser in the Last Year: Never true   Arboriculturist in the Last Year: Never true  Transportation Needs: Unmet Transportation Needs   Lack of Transportation (Medical): Yes   Lack of Transportation (Non-Medical): Yes  Physical Activity: Inactive   Days of Exercise per Week: 0 days   Minutes of Exercise per Session: 0 min  Stress: Stress Concern Present   Feeling of Stress : To some extent  Social Connections: Moderately Integrated   Frequency of Communication with Friends and Family: Once a week   Frequency of Social Gatherings with Friends and Family: Once a week   Attends Religious Services: 1 to 4 times per year   Active Member of [employer] or Organizations: Yes   Attends Archivist Meetings: 1 to 4 times per year   Marital Status: Married    Tobacco Counseling Counseling given: Not Answered   Clinical Intake:  Pre-visit preparation completed: Yes  Pain : 0-10 Pain Score: 5  Pain Type: Chronic pain Pain Location: Generalized Pain Descriptors / Indicators: Aching, Discomfort, Sore Pain Onset: More than a month ago Pain Frequency: Intermittent     BMI - recorded: 26.31 Nutritional Status: BMI 25 -29 Overweight Nutritional Risks: Unintentional weight loss Diabetes: No  How often do you need to have someone help you when you read instructions, pamphlets, or other written materials from your doctor or pharmacy?: 5 - Always  Diabetic? No  Interpreter Needed?: No  Information entered by :: Brett Gillentine, LPN   Activities of Daily  Living In your present state of health, do you have any difficulty performing the following activities: 01/25/2021  Hearing? Y  Vision? N  Difficulty concentrating or making decisions? Y  Walking or climbing stairs? Y  Dressing or bathing? Y  Doing errands, shopping? Y  Preparing Food and eating ? Y  Using the Toilet? Y  In the past six months, have you accidently leaked urine? Y  Do you have problems with loss of bowel control? Y  Managing your Medications? Y  Managing your Finances? Y  Housekeeping or managing your Housekeeping? Y  Some recent data might be hidden    Patient Care Team: [name], [name], MD as PCP - General (Family Medicine)  Indicate any recent Medical Services you may have received from other than [facility] providers  in the past year (date may be approximate).     Assessment:   This is a routine wellness examination for Brett Pearson.  Hearing/Vision screen Hearing Screening - Comments:: Seems to have mild hearing loss Vision Screening - Comments:: Seems to have no issues with his vision; he does use reading glasses prn; does not have an eye doctor  Dietary issues and exercise activities discussed: Current Exercise Habits: The patient does not participate in regular exercise at present, Exercise limited by: neurologic condition(s);orthopedic condition(s);psychological condition(s);cardiac condition(s)   Goals Addressed             This Visit's Progress    DIET - INCREASE WATER INTAKE   Not on track    Try to drink 6-8 glasses of water daily      Exercise 3x per week (30 min per time)   Not on track      Depression Screen PHQ 2/9 Scores 01/25/2021 12/12/2020 10/27/2020 09/01/2020 07/21/2020 06/09/2020 04/21/2020  PHQ - 2 Score 0 0 0 0 0 0 0    Fall Risk Fall Risk  01/25/2021 01/04/2021 12/12/2020 10/27/2020 09/01/2020  Falls in the past year? 1 1 1  0 0  Number falls in past yr: 1 1 1  - -  Comment - - - - -  Injury with Fall? 1 1 1  - -  Comment - - - - -  Risk for  fall due to : Impaired mobility;Orthopedic patient;History of fall(s);Medication side effect;Mental status change History of fall(s) History of fall(s) - -  Risk for fall due to: Comment - - - - -  Follow up Education provided;Falls prevention discussed Falls evaluation completed Falls evaluation completed - -    FALL RISK PREVENTION PERTAINING TO THE HOME:  Any stairs in or around the home? No  If so, are there any without handrails? No  Home free of loose throw rugs in walkways, pet beds, electrical cords, etc? Yes  Adequate lighting in your home to reduce risk of falls? Yes   ASSISTIVE DEVICES UTILIZED TO PREVENT FALLS:  Life alert? No  Use of a cane, walker or w/c? Yes  Grab bars in the bathroom? Yes  Shower chair or bench in shower? Yes  Elevated toilet seat or a handicapped toilet? Yes   TIMED UP AND GO:  Was the test performed? No . Telephonic visit. - wheelchair bound  Cognitive Function: Cognitive status assessed by direct observation. Patient has current diagnosis of cognitive impairment. Patient is followed by neurology for ongoing assessment. Patient is unable to complete screening 6CIT or MMSE. He was very confused today and couldn't answer questions.     6CIT Screen 01/25/2020 01/07/2019  What Year? 0 points 0 points  What month? 0 points 0 points  What time? 0 points 0 points  Count back from 20 0 points 0 points  Months in reverse 0 points 0 points  Repeat phrase 4 points 0 points  Total Score 4 0    Immunizations Immunization History  Administered Date(s) Administered   Fluad Quad(high Dose 65+) 04/19/2019, 04/21/2020   Hep A / Hep B 03/13/2017, 04/15/2017, 10/14/2017   Influenza, High Dose Seasonal PF 05/14/2014, 06/06/2015, 05/09/2016, 05/12/2017, 05/12/2017, 05/03/2018, 05/03/2018   Influenza-Unspecified 06/03/2005, 05/07/2007, 06/06/2012, 04/26/2015, 06/08/2015, 05/06/2016, 05/21/2017   PFIZER Comirnaty(Gray Top)Covid-19 Tri-Sucrose Vaccine 12/06/2020    PFIZER(Purple Top)SARS-COV-2 Vaccination 07/07/2020   Pneumococcal Conjugate-13 02/09/2015   Pneumococcal Polysaccharide-23 05/29/2006   Pneumococcal-Unspecified 08/06/2006   Td 11/15/2004   Tdap 06/18/2011   Zoster Recombinat (Shingrix)  04/17/2018, 04/17/2018   Zoster, Live 08/06/2008    TDAP status: Up to date  Flu Vaccine status: Up to date  Pneumococcal vaccine status: Up to date  Covid-19 vaccine status: Completed vaccines  Qualifies for Shingles Vaccine? Yes   Zostavax completed Yes   Shingrix Completed?: Yes  Screening Tests Health Maintenance  Topic Date Due   Zoster Vaccines- Shingrix (2 of 2) 06/12/2018   COVID-19 Vaccine (3 - Pfizer risk series) 01/03/2021   INFLUENZA VACCINE  03/06/2021   TETANUS/TDAP  06/17/2021   PNA vac Low Risk Adult  Completed   HPV VACCINES  Aged Out   Hepatitis C Screening  Discontinued    Health Maintenance  Health Maintenance Due  Topic Date Due   Zoster Vaccines- Shingrix (2 of 2) 06/12/2018   COVID-19 Vaccine (3 - Pfizer risk series) 01/03/2021    Colorectal cancer screening: No longer required.   Lung Cancer Screening: (Low Dose CT Chest recommended if Age 15-80 years, 30 pack-year currently smoking OR have quit w/in 15years.) does not qualify.  Additional Screening:  Hepatitis C Screening: does not qualify  Vision Screening: Recommended annual ophthalmology exams for early detection of glaucoma and other disorders of the eye. Is the patient up to date with their annual eye exam?  No  Who is the provider or what is the name of the office in which the patient attends annual eye exams? [facility] If pt is not established with a provider, would they like to be referred to a provider to establish care? No .   Dental Screening: Recommended annual dental exams for proper oral hygiene  Community Resource Referral / Chronic Care Management: CRR required this visit?  Yes   CCM required this visit?  Yes      Plan:      I have personally reviewed and noted the following in the patient's chart:   Medical and social history Use of alcohol, tobacco or illicit drugs  Current medications and supplements including opioid prescriptions. Patient is not currently taking opioid prescriptions. Functional ability and status Nutritional status Physical activity Advanced directives List of other physicians Hospitalizations, surgeries, and ER visits in previous 12 months Vitals Screenings to include cognitive, depression, and falls Referrals and appointments  In addition, I have reviewed and discussed with patient certain preventive protocols, quality metrics, and best practice recommendations. A written personalized care plan for preventive services as well as general preventive health recommendations were provided to patient.     Brett Hammond, LPN   11/12/8117   Nurse Notes: CCM and CRR referral - wife is having difficulty caring for him. He is increasingly confused and combative. She has pneumonia and has a surgery scheduled soon and cannot provide the care he needs.

## 2021-01-25 NOTE — Telephone Encounter (Signed)
Pt will come in tomorrow for INR. Future orders placed. Wife made aware by Yvone Neu that pt would need an OV or a virtual telephone visit to discuss placement into a facility or home health. Pt does have an appt 02/15/21

## 2021-01-25 NOTE — Patient Instructions (Signed)
Brett Pearson , Thank you for taking time to come for your Medicare Wellness Visit. I appreciate your ongoing commitment to your health goals. Please review the following plan we discussed and let me know if I can assist you in the future.   Screening recommendations/referrals: Colonoscopy: No longer required Recommended yearly ophthalmology/optometry visit for glaucoma screening and checkup Recommended yearly dental visit for hygiene and checkup  Vaccinations: Influenza vaccine: Done 04/21/2020 - Repeat annually Pneumococcal vaccine: Done 05/05/2013 & 02/09/2015 Tdap vaccine: Done 06/18/2011 - Repeat in 10 years Shingles vaccine: Zostavax done 2010; Shingrix dose one done 04/17/2018, due for second dose   Covid-19: Done 07/07/2020 & 12/06/2020  Advanced directives: Please bring a copy of your health care power of attorney and living will to the office to be added to your chart at your convenience.  Conditions/risks identified: Continue Carnation instant breakfast or similar meal supplements when no appetite; continue to encourage several small meals each day.   Next appointment: Follow up in one year for your annual wellness visit.   Preventive Care 54 Years and Older, Male  Preventive care refers to lifestyle choices and visits with your health care provider that can promote health and wellness. What does preventive care include? A yearly physical exam. This is also called an annual well check. Dental exams once or twice a year. Routine eye exams. Ask your health care provider how often you should have your eyes checked. Personal lifestyle choices, including: Daily care of your teeth and gums. Regular physical activity. Eating a healthy diet. Avoiding tobacco and drug use. Limiting alcohol use. Practicing safe sex. Taking low doses of aspirin every day. Taking vitamin and mineral supplements as recommended by your health care provider. What happens during an annual well check? The  services and screenings done by your health care provider during your annual well check will depend on your age, overall health, lifestyle risk factors, and family history of disease. Counseling  Your health care provider may ask you questions about your: Alcohol use. Tobacco use. Drug use. Emotional well-being. Home and relationship well-being. Sexual activity. Eating habits. History of falls. Memory and ability to understand (cognition). Work and work Statistician. Screening  You may have the following tests or measurements: Height, weight, and BMI. Blood pressure. Lipid and cholesterol levels. These may be checked every 5 years, or more frequently if you are over 41 years old. Skin check. Lung cancer screening. You may have this screening every year starting at age 66 if you have a 30-pack-year history of smoking and currently smoke or have quit within the past 15 years. Fecal occult blood test (FOBT) of the stool. You may have this test every year starting at age 31. Flexible sigmoidoscopy or colonoscopy. You may have a sigmoidoscopy every 5 years or a colonoscopy every 10 years starting at age 74. Prostate cancer screening. Recommendations will vary depending on your family history and other risks. Hepatitis C blood test. Hepatitis B blood test. Sexually transmitted disease (STD) testing. Diabetes screening. This is done by checking your blood sugar (glucose) after you have not eaten for a while (fasting). You may have this done every 1-3 years. Abdominal aortic aneurysm (AAA) screening. You may need this if you are a current or former smoker. Osteoporosis. You may be screened starting at age 47 if you are at high risk. Talk with your health care provider about your test results, treatment options, and if necessary, the need for more tests. Vaccines  Your health care provider  may recommend certain vaccines, such as: Influenza vaccine. This is recommended every year. Tetanus,  diphtheria, and acellular pertussis (Tdap, Td) vaccine. You may need a Td booster every 10 years. Zoster vaccine. You may need this after age 39. Pneumococcal 13-valent conjugate (PCV13) vaccine. One dose is recommended after age 39. Pneumococcal polysaccharide (PPSV23) vaccine. One dose is recommended after age 61. Talk to your health care provider about which screenings and vaccines you need and how often you need them. This information is not intended to replace advice given to you by your health care provider. Make sure you discuss any questions you have with your health care provider. Document Released: 08/19/2015 Document Revised: 04/11/2016 Document Reviewed: 05/24/2015 Elsevier Interactive Patient Education  2017 [employer] Prevention in the Home Falls can cause injuries. They can happen to people of all ages. There are many things you can do to make your home safe and to help prevent falls. What can I do on the outside of my home? Regularly fix the edges of walkways and driveways and fix any cracks. Remove anything that might make you trip as you walk through a door, such as a raised step or threshold. Trim any bushes or trees on the path to your home. Use bright outdoor lighting. Clear any walking paths of anything that might make someone trip, such as rocks or tools. Regularly check to see if handrails are loose or broken. Make sure that both sides of any steps have handrails. Any raised decks and porches should have guardrails on the edges. Have any leaves, snow, or ice cleared regularly. Use sand or salt on walking paths during winter. Clean up any spills in your garage right away. This includes oil or grease spills. What can I do in the bathroom? Use night lights. Install grab bars by the toilet and in the tub and shower. Do not use towel bars as grab bars. Use non-skid mats or decals in the tub or shower. If you need to sit down in the shower, use a plastic,  non-slip stool. Keep the floor dry. Clean up any water that spills on the floor as soon as it happens. Remove soap buildup in the tub or shower regularly. Attach bath mats securely with double-sided non-slip rug tape. Do not have throw rugs and other things on the floor that can make you trip. What can I do in the bedroom? Use night lights. Make sure that you have a light by your bed that is easy to reach. Do not use any sheets or blankets that are too big for your bed. They should not hang down onto the floor. Have a firm chair that has side arms. You can use this for support while you get dressed. Do not have throw rugs and other things on the floor that can make you trip. What can I do in the kitchen? Clean up any spills right away. Avoid walking on wet floors. Keep items that you use a lot in easy-to-reach places. If you need to reach something above you, use a strong step stool that has a grab bar. Keep electrical cords out of the way. Do not use floor polish or wax that makes floors slippery. If you must use wax, use non-skid floor wax. Do not have throw rugs and other things on the floor that can make you trip. What can I do with my stairs? Do not leave any items on the stairs. Make sure that there are handrails on both sides  of the stairs and use them. Fix handrails that are broken or loose. Make sure that handrails are as long as the stairways. Check any carpeting to make sure that it is firmly attached to the stairs. Fix any carpet that is loose or worn. Avoid having throw rugs at the top or bottom of the stairs. If you do have throw rugs, attach them to the floor with carpet tape. Make sure that you have a light switch at the top of the stairs and the bottom of the stairs. If you do not have them, ask someone to add them for you. What else can I do to help prevent falls? Wear shoes that: Do not have high heels. Have rubber bottoms. Are comfortable and fit you well. Are closed  at the toe. Do not wear sandals. If you use a stepladder: Make sure that it is fully opened. Do not climb a closed stepladder. Make sure that both sides of the stepladder are locked into place. Ask someone to hold it for you, if possible. Clearly mark and make sure that you can see: Any grab bars or handrails. First and last steps. Where the edge of each step is. Use tools that help you move around (mobility aids) if they are needed. These include: Canes. Walkers. Scooters. Crutches. Turn on the lights when you go into a dark area. Replace any light bulbs as soon as they burn out. Set up your furniture so you have a clear path. Avoid moving your furniture around. If any of your floors are uneven, fix them. If there are any pets around you, be aware of where they are. Review your medicines with your doctor. Some medicines can make you feel dizzy. This can increase your chance of falling. Ask your doctor what other things that you can do to help prevent falls. This information is not intended to replace advice given to you by your health care provider. Make sure you discuss any questions you have with your health care provider. Document Released: 05/19/2009 Document Revised: 12/29/2015 Document Reviewed: 08/27/2014 Elsevier Interactive Patient Education  2017 [employer].

## 2021-01-25 NOTE — Telephone Encounter (Signed)
Patients significant other called to report that he went to bed last night without his condom catheter.  He soaked his bed pad last nite.  Caregiver reports the smell was absolutely the worst it has ever been. She reports no bleeding or obvious presence of concentrated urine.   She also reports that she was diagnosed with pneumonia on Sunday.  Spent all day in urgent/ED care yesterday.  She is feeling overwhelmed.  She feels she can no longer keep up with patients care needs.

## 2021-01-26 ENCOUNTER — Ambulatory Visit (INDEPENDENT_AMBULATORY_CARE_PROVIDER_SITE_OTHER): Payer: PPO | Admitting: Family Medicine

## 2021-01-26 ENCOUNTER — Encounter: Payer: Self-pay | Admitting: Family Medicine

## 2021-01-26 ENCOUNTER — Ambulatory Visit (INDEPENDENT_AMBULATORY_CARE_PROVIDER_SITE_OTHER): Payer: PPO | Admitting: Licensed Clinical Social Worker

## 2021-01-26 ENCOUNTER — Other Ambulatory Visit: Payer: PPO

## 2021-01-26 DIAGNOSIS — Z79899 Other long term (current) drug therapy: Secondary | ICD-10-CM

## 2021-01-26 DIAGNOSIS — I482 Chronic atrial fibrillation, unspecified: Secondary | ICD-10-CM

## 2021-01-26 DIAGNOSIS — S88111S Complete traumatic amputation at level between knee and ankle, right lower leg, sequela: Secondary | ICD-10-CM

## 2021-01-26 DIAGNOSIS — S88111A Complete traumatic amputation at level between knee and ankle, right lower leg, initial encounter: Secondary | ICD-10-CM

## 2021-01-26 DIAGNOSIS — E538 Deficiency of other specified B group vitamins: Secondary | ICD-10-CM

## 2021-01-26 DIAGNOSIS — R413 Other amnesia: Secondary | ICD-10-CM

## 2021-01-26 DIAGNOSIS — Z741 Need for assistance with personal care: Secondary | ICD-10-CM | POA: Diagnosis not present

## 2021-01-26 DIAGNOSIS — I10 Essential (primary) hypertension: Secondary | ICD-10-CM

## 2021-01-26 DIAGNOSIS — N390 Urinary tract infection, site not specified: Secondary | ICD-10-CM

## 2021-01-26 DIAGNOSIS — N3945 Continuous leakage: Secondary | ICD-10-CM

## 2021-01-26 DIAGNOSIS — F0150 Vascular dementia without behavioral disturbance: Secondary | ICD-10-CM

## 2021-01-26 DIAGNOSIS — R4189 Other symptoms and signs involving cognitive functions and awareness: Secondary | ICD-10-CM

## 2021-01-26 LAB — COAGUCHEK XS/INR WAIVED
INR: 1.6 — ABNORMAL HIGH (ref 0.9–1.1)
Prothrombin Time: 18.9 s

## 2021-01-26 LAB — URINALYSIS
Bilirubin, UA: NEGATIVE
Glucose, UA: NEGATIVE
Nitrite, UA: POSITIVE — AB
Specific Gravity, UA: 1.025 (ref 1.005–1.030)
Urobilinogen, Ur: 2 mg/dL — ABNORMAL HIGH (ref 0.2–1.0)
pH, UA: 5.5 (ref 5.0–7.5)

## 2021-01-26 NOTE — Chronic Care Management (AMB) (Signed)
Chronic Care Management    Clinical Social Work Note  01/26/2021 Name: Brett Pearson. MRN: [id number] DOB: 07-13-1941  Brett Pearson. is a 80 y.o. year old male who is a primary care patient of Brett Pearson, Brett Kaufmann, MD. The CCM team was consulted to assist the patient with chronic disease management and/or care coordination needs related to: [name] .   Engaged with patient / significant other, Brett Pearson,  by telephone for initial visit in response to provider referral for social work chronic care management and care coordination services.   Consent to Services:  The patient was given the following information about Chronic Care Management services today, agreed to services, and gave verbal consent: 1. CCM service includes personalized support from designated clinical staff supervised by the primary care provider, including individualized plan of care and coordination with other care providers 2. 24/7 contact phone numbers for assistance for urgent and routine care needs. 3. Service will only be billed when office clinical staff spend 20 minutes or more in a month to coordinate care. 4. Only one practitioner may furnish and bill the service in a calendar month. 5.The patient may stop CCM services at any time (effective at the end of the month) by phone call to the office staff. 6. The patient will be responsible for cost sharing (co-pay) of up to 20% of the service fee (after annual deductible is met). Patient agreed to services and consent obtained.  Patient agreed to services and consent obtained.   Assessment: Review of patient past medical history, allergies, medications, and health status, including review of relevant consultants reports was performed today as part of a comprehensive evaluation and provision of chronic care management and care coordination services.     SDOH (Social Determinants of Health) assessments and interventions performed:  SDOH Interventions     Flowsheet Row Most Recent Value  SDOH Interventions   Depression Interventions/Treatment  --  [informed of LCSW support and or RNCM support]        Advanced Directives Status: See [employer] application for related entries.  CCM Care Plan  Allergies  Allergen Reactions   Amoxicillin     Other reaction(s): HIVES    Outpatient Encounter Medications as of 01/26/2021  Medication Sig Note   cholecalciferol (VITAMIN D) 1000 UNITS tablet Take 1,000 Units by mouth daily.    Cyanocobalamin (VITAMIN B-12 PO) Take by mouth daily.    donepezil (ARICEPT) 5 MG tablet TAKE 1 TABLET BY MOUTH EVERYDAY AT BEDTIME    gabapentin (NEURONTIN) 300 MG capsule Take 3 capsules (900 mg total) by mouth 2 (two) times daily.    Magnesium Gluconate 500 (27 Mg) MG TABS Take 500 mg by mouth. 07/11/2016: Received from: [facility] Received Sig: Take 500 mg by mouth.   memantine (NAMENDA) 5 MG tablet TAKE 1 TABLET BY MOUTH TWICE A DAY    metoprolol succinate (TOPROL-XL) 25 MG 24 hr tablet 02/29/16 TAKE 1 TABLET BY MOUTH DAILY    Multiple Vitamin (MULTIVITAMIN) tablet Take 1 tablet by mouth daily.    warfarin (COUMADIN) 2 MG tablet TAKE 4 MG (2 TABLETS) EVERY DAY EXCEPT SATURDAY 6 MG (3 TABLETS)    No facility-administered encounter medications on file as of 01/26/2021.    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   Bronchiectasis without complication ([place]) 70/08/7492   Interstitial lung disease ([place]) 02/08/2017   Abnormal CT of the chest 01/30/2017   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   Memory loss 08/03/2016   Vitamin B12 deficiency 02/13/2016   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   Essential hypertension 07/13/2015   Obesity (BMI 30-39.9) 07/13/2015   Chronic atrial fibrillation ([place]) 11/06/2012   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    Conditions to be addressed/monitored: Monitor client completion of ADLs   Care Plan : LCSW Care Plan  Updates made by Brett Cabal, LCSW since 01/26/2021 12:00 AM     Problem: Coping Skills (General Plan of Care)      Goal: Coping Skills Enhanced: Complete ADLs daily, as able   Start Date: 01/26/2021  Expected End Date: 04/21/2021  This Visit's Progress: On track  Priority: Medium  Note:   Current barriers:   Patient in need of assistance with connecting to community resources for help in completing ADLs Patient is unable to independently navigate community resource options without care coordination support Mobility issues Memory issues  Clinical Goals:   Patient will communicate with LCSW in next 30 days to discuss ADLs completion of client Patient will communicate will RNCM as needed in next 30 days for nursing support  Clinical Interventions:  Collaboration with Brett Pearson, Brett Kaufmann, MD regarding development and update of comprehensive plan of care as evidenced by provider  attestation and co-signature Assessment of needs, barriers of client  Talked with Brett Pearson about RNCM support for client Talked with Brett Pearson about CCM program support Talked with Brett Pearson about vision needs of client Talked with Brett Pearson about medication procurement of client Talked with Brett Pearson about socialization of client Talked with Brett Pearson about memory issues of client Talked with Brett Pearson about client work history Talked with Brett Pearson about client  participation in local book club  Talked with Brett Pearson about client completion of ADLs (she said he does a sponge bath daily and often it may take about 2 hours to complete) Talked with Brett Pearson about hearing needs of client Talked with Brett Pearson about sleeping issues of client Talked with Brett Pearson about ADTS support  Talked with Brett Pearson about hobbies of client (she said client had very few hobbies or interests)  Patient Coping Skills: Has support from [facility] Attends scheduled medical appointments  Patient Deficits:  Memory issues Mobility issues  Patient Goals: In next 30 days, client will Attend scheduled medical appointments Take medications as prescribed Talk regularly with Brett Pearson about client needs -  Follow Up Plan: LCSW to call client  or Brett Pearson on 03/13/21     Norva Riffle.Shaquoia Miers MSW, LCSW Licensed Clinical Social Worker [facility] Care Management [phone]

## 2021-01-26 NOTE — Patient Instructions (Signed)
Visit Information  PATIENT GOALS:  Goals Addressed             This Visit's Progress    Manage My Emotions; Complete ADLs daily, as able       Timeframe:  Short-Term Goal Priority:  Medium Progress: On Track Start Date:             01/26/21                Expected End Date:           04/21/21            Follow Up Date 03/13/21   Manage Emotions: Complete ADLs daily as able    Why is this important?   When you are stressed, down or upset, your body reacts too.  For example, your blood pressure may get higher; you may have a headache or stomachache.  When your emotions get the best of you, your body's ability to fight off cold and flu gets weak.  These steps will help you manage your emotions.     Patient Coping Skills: Has support from [facility] Attends scheduled medical appointments  Patient Deficits:  Memory issues Mobility issues  Patient Goals: In next 30 days, client will Attend scheduled medical appointments Take medications as prescribed Talk regularly with [name] about client needs -  Follow Up Plan: LCSW to call client  or [name] on 03/13/21     Norva Riffle.Charlottie Peragine MSW, LCSW Licensed Clinical Social Worker [facility] Care Management [phone]

## 2021-01-26 NOTE — Progress Notes (Signed)
Subjective: CC: INR check PCP: [name], [name], MD [id number] R Gagandeep Pettet. is a 80 y.o. male presenting to clinic today for:  1.  INR check/ dementia/ decline in status Patient is anticoagulated with Coumadin for atrial fibrillation.  Per his PCPs last note he takes 6 mg daily except for on Mondays and Fridays, when he takes 4 mg.  His wife unfortunately was hospitalized recently for pneumonia.  When she returned home the patient seemed to be very confused and she noticed that his weeks worth of Coumadin was missing.  She is not sure if he took the medication or where it is but she looked multiple places and could not find the pills.  She does not report any bleeding.  He continues to wax and wane in mentation and she worries quite a bit about his mental state.  He suffers from dementia but it seems to be worse since she was hospitalized.  She is asking for some assistance today would really like home health to come out and assess him.  She worries about his gait and functionality.  She denies any visual or auditory hallucinations.  Does not appear to have any self-harm activities.  He is not wandering.  In fact the fact that he uses a prosthetic really limits him from wandering.  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    Venous insufficiency     Current Outpatient Medications:    cholecalciferol (VITAMIN D) 1000 UNITS tablet, Take 1,000 Units by mouth daily., Disp: , Rfl:    Cyanocobalamin (VITAMIN B-12 PO), Take by mouth daily., Disp: , Rfl:    donepezil (ARICEPT) 5 MG tablet, TAKE 1 TABLET BY MOUTH EVERYDAY AT BEDTIME, Disp: 90 tablet, Rfl: 0   gabapentin (NEURONTIN) 300 MG capsule, Take 3 capsules (900 mg total) by mouth 2 (two) times daily., Disp: 180 capsule,  Rfl: 3   Magnesium Gluconate 500 (27 Mg) MG TABS, Take 500 mg by mouth., Disp: , Rfl:    memantine (NAMENDA) 5 MG tablet, TAKE 1 TABLET BY MOUTH TWICE A DAY, Disp: 180 tablet, Rfl: 0   metoprolol succinate (TOPROL-XL) 25 MG 24 hr tablet, 02/29/16 TAKE 1 TABLET BY MOUTH DAILY, Disp: 90 tablet, Rfl: 0   Multiple Vitamin (MULTIVITAMIN) tablet, Take 1 tablet by mouth daily., Disp: , Rfl:    warfarin (COUMADIN) 2 MG tablet, TAKE 4 MG (2 TABLETS) EVERY DAY EXCEPT SATURDAY 6 MG (3 TABLETS), Disp: 200 tablet, Rfl: 0 Social History   Socioeconomic History   Marital status: Married    Spouse name: [name]   Number of children: 2   Years of education: 16   Highest education level: Master's degree (e.g., MA, MS, MEng, MEd, MSW, MBA)  Occupational History   Occupation: Retired  Tobacco Use   Smoking status: Former    Pack years: 0.00    Types: Cigarettes    Quit date: 06/15/1959    Years since quitting: 61.6   Smokeless tobacco: Never  Vaping Use   Vaping Use: Never used  Substance and Sexual Activity   Alcohol use: No    Alcohol/week: 0.0 standard drinks   Drug use: No   Sexual activity: Not Currently  Other  Topics Concern   Not on file  Social History Narrative   Lives with wife.    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     Is having increased confusion, memory loss - wife's health is also declining - she is looking to find some assistance with his ADLs       Social Determinants of Health   Financial Resource Strain: Low Risk    Difficulty of Paying Living Expenses: Not hard at all  Food Insecurity: No Food Insecurity   Worried About Charity fundraiser in the Last Year: Never true   Arboriculturist in the Last Year: Never true  Transportation Needs: Unmet Transportation Needs   Lack of Transportation (Medical): Yes   Lack of Transportation (Non-Medical): Yes  Physical Activity: Inactive   Days of Exercise per Week: 0 days   Minutes of Exercise per Session: 0 min  Stress: Stress Concern Present    Feeling of Stress : To some extent  Social Connections: Moderately Integrated   Frequency of Communication with Friends and Family: Once a week   Frequency of Social Gatherings with Friends and Family: Once a week   Attends Religious Services: 1 to 4 times per year   Active Member of [employer] or Organizations: Yes   Attends Archivist Meetings: 1 to 4 times per year   Marital Status: Married  Human resources officer Violence: Not At Risk   Fear of Current or Ex-Partner: No   Emotionally Abused: No   Physically Abused: No   Sexually Abused: No   Family History  Problem Relation Age of Onset   Stroke Mother    Parkinson's disease Father     Objective:  Physical Examination:  General: Awake, alert, No acute distress Pulm: Normal work of breathing on room air MSK: Unsteady gait.  Has BKA.  Using prosthetic Neuro: Does not really engage with provider.  He is sitting quietly listening to the conversation between his wife and I Psych: Does not appear to be responding to internal stimuli  Assessment/ Plan: 80 y.o. male   Chronic atrial fibrillation ([place]) - Plan: AMB Referral to [place], Ambulatory referral to [place] amputation of right lower extremity ([place]) - Plan: AMB Referral to [place], Ambulatory referral to [place] assistance with all daily activities - Plan: AMB Referral to [place], Ambulatory referral to [place]  Continuous leakage of urine - Plan: AMB Referral to [facility], Ambulatory referral to [facility]  Vascular dementia without behavioral disturbance ([place]) - Plan: AMB Referral to [place], Ambulatory referral to Home Health  Cognitive decline - Plan: AMB Referral to [place], Ambulatory referral to [place]  His INR was subtherapeutic at 1.6 today.  I believe this is because these medications in fact have been misplaced.   There is no evidence that he took the missing medication since he has had subtherapeutic levels.  I reiterated need to go back on normal dosing regimen and recheck with PCP in the next week.  I was glad to go ahead and place home health orders for this patient who certainly has been experiencing some cognitive decline and has multiple medical conditions which do require closer observation.  I also placed a referral to community care services.  I think that his wife certainly needs some additional support given his decline in mental status and dependence of ADLs.  I am not sure how much home  health will be able to offer in the long-term.  Patient's may ultimately need to consider placement if she is not able to care for him versus hiring someone privately.  Will defer further assessment of this to his PCP and care team.  No orders of the defined types were placed in this encounter.  No orders of the defined types were placed in this encounter.    Janora Norlander, DO [place] [phone]

## 2021-01-27 ENCOUNTER — Telehealth: Payer: Self-pay | Admitting: Family Medicine

## 2021-01-27 ENCOUNTER — Other Ambulatory Visit: Payer: Self-pay | Admitting: Family Medicine

## 2021-01-27 DIAGNOSIS — N3001 Acute cystitis with hematuria: Secondary | ICD-10-CM

## 2021-01-27 MED ORDER — CEPHALEXIN 500 MG PO CAPS
500.0000 mg | ORAL_CAPSULE | Freq: Two times a day (BID) | ORAL | 0 refills | Status: DC
Start: 2021-01-27 — End: 2021-02-02

## 2021-01-27 NOTE — Telephone Encounter (Signed)
HH has already been ordered (after we spoke yesterday) but it does not happen overnight unfortunately.  [facility] is aware that she needs help ASAP and is actively working on that for them.  Follow up with Dr D as we discussed for recheck.

## 2021-01-27 NOTE — Telephone Encounter (Signed)
His right appears infected.  Please inform his wife that I have placed an order for antibiotics.  Please make sure that he has appropriate follow-up with Dr. Warrick Parisian in 1 week

## 2021-01-27 NOTE — Telephone Encounter (Signed)
PATIENTS WIFE AWARE

## 2021-01-31 DIAGNOSIS — F0280 Dementia in other diseases classified elsewhere without behavioral disturbance: Secondary | ICD-10-CM | POA: Diagnosis not present

## 2021-01-31 DIAGNOSIS — Z8701 Personal history of pneumonia (recurrent): Secondary | ICD-10-CM | POA: Diagnosis not present

## 2021-01-31 DIAGNOSIS — Z89511 Acquired absence of right leg below knee: Secondary | ICD-10-CM | POA: Diagnosis not present

## 2021-01-31 DIAGNOSIS — I872 Venous insufficiency (chronic) (peripheral): Secondary | ICD-10-CM | POA: Diagnosis not present

## 2021-01-31 DIAGNOSIS — G309 Alzheimer's disease, unspecified: Secondary | ICD-10-CM | POA: Diagnosis not present

## 2021-01-31 DIAGNOSIS — Z87891 Personal history of nicotine dependence: Secondary | ICD-10-CM | POA: Diagnosis not present

## 2021-01-31 DIAGNOSIS — Z7901 Long term (current) use of anticoagulants: Secondary | ICD-10-CM | POA: Diagnosis not present

## 2021-01-31 DIAGNOSIS — Z87440 Personal history of urinary (tract) infections: Secondary | ICD-10-CM | POA: Diagnosis not present

## 2021-01-31 DIAGNOSIS — G629 Polyneuropathy, unspecified: Secondary | ICD-10-CM | POA: Diagnosis not present

## 2021-01-31 DIAGNOSIS — E876 Hypokalemia: Secondary | ICD-10-CM | POA: Diagnosis not present

## 2021-01-31 DIAGNOSIS — I482 Chronic atrial fibrillation, unspecified: Secondary | ICD-10-CM | POA: Diagnosis not present

## 2021-02-01 ENCOUNTER — Telehealth: Payer: Self-pay | Admitting: Family Medicine

## 2021-02-01 LAB — URINE CULTURE

## 2021-02-01 NOTE — Telephone Encounter (Signed)
Dr. Warrick Parisian,  Mr. Brett Pearson does not have a follow up with you until 7/13.  His INR was 1.6 on 6/23 when [facility] seen him.  Can he wait until follow up or need to be seen sooner.

## 2021-02-01 NOTE — Telephone Encounter (Signed)
   Telephone encounter was:  Successful.  02/01/2021 Name: Brett Pearson. MRN: [id number] DOB: Jan 15, 1941  Brett Pearson. is a 80 y.o. year old male who is a primary care patient of [name], [name], MD . The community resource team was consulted for assistance with Transportation Needs   Care guide performed the following interventions: Patient provided with information about care guide support team and interviewed to confirm resource needs.  Follow Up Plan:  Care guide will follow up with patient by phone over the next week if more information is needed, otherwise I will email pts wife [name] [email or web address] all transportation information.   April Green Care Guide, Embedded Care Coordination [place], Care Management Phone: [phone] Email: [email or web address]

## 2021-02-01 NOTE — Telephone Encounter (Signed)
He was supposed to have a sooner appointment, see if we can get him in sooner

## 2021-02-01 NOTE — Telephone Encounter (Signed)
[name] called in stating that pt needed INR checked. Offered appt at 2:25 with PCP, Dr [name], appt refused because they needed it at 3 because that is the time that [name] is coming in for her new pt appt. She then stated that pt does not usually need an appt for his INR. Please call back and advise.

## 2021-02-02 ENCOUNTER — Ambulatory Visit (INDEPENDENT_AMBULATORY_CARE_PROVIDER_SITE_OTHER): Payer: PPO | Admitting: Family Medicine

## 2021-02-02 ENCOUNTER — Encounter: Payer: Self-pay | Admitting: Family Medicine

## 2021-02-02 ENCOUNTER — Other Ambulatory Visit: Payer: Self-pay

## 2021-02-02 VITALS — BP 98/56 | HR 55 | Ht 72.0 in

## 2021-02-02 DIAGNOSIS — I482 Chronic atrial fibrillation, unspecified: Secondary | ICD-10-CM

## 2021-02-02 LAB — COAGUCHEK XS/INR WAIVED
INR: 1.8 — ABNORMAL HIGH (ref 0.9–1.1)
Prothrombin Time: 21.7 s

## 2021-02-02 NOTE — Progress Notes (Signed)
BP (!) 98/56   Pulse (!) 55   Ht 6' (1.829 m)   SpO2 97%   BMI 26.31 kg/m    Subjective:   Patient ID: Brett Duffel., male    DOB: November 11, 1940, 80 y.o.   MRN: [id number]  HPI: Brett Klecka. is a 80 y.o. male presenting on 02/02/2021 for Medical Management of Chronic Issues and chronic atrial fibrillation   HPI Coumadin recheck Target goal: 2.0-3.0 Reason on anticoagulation: Chronic A. fib Patient denies any bruising or bleeding or chest pain or palpitations   Relevant past medical, surgical, family and social history reviewed and updated as indicated. Interim medical history since our last visit reviewed. Allergies and medications reviewed and updated.  Review of Systems  Constitutional:  Negative for chills and fever.  Respiratory:  Negative for shortness of breath and wheezing.   Cardiovascular:  Negative for chest pain and leg swelling.  Gastrointestinal:  Negative for anal bleeding and blood in stool.  Genitourinary:  Negative for hematuria.  Musculoskeletal:  Negative for back pain and gait problem.  Skin:  Negative for rash.  All other systems reviewed and are negative.  Per HPI unless specifically indicated above   Allergies as of 02/02/2021       Reactions   Amoxicillin    Other reaction(s): HIVES        Medication List        Accurate as of February 02, 2021  3:42 PM. If you have any questions, ask your nurse or doctor.          STOP taking these medications    cephALEXin 500 MG capsule Commonly known as: KEFLEX Stopped by: [name] Brett Sedivy, MD       TAKE these medications    cholecalciferol 1000 units tablet Commonly known as: VITAMIN D Take 1,000 Units by mouth daily.   donepezil 5 MG tablet Commonly known as: ARICEPT TAKE 1 TABLET BY MOUTH EVERYDAY AT BEDTIME   gabapentin 300 MG capsule Commonly known as: NEURONTIN Take 3 capsules (900 mg total) by mouth 2 (two) times daily.   Magnesium Gluconate 500 (27 Mg) MG Tabs Take  500 mg by mouth.   memantine 5 MG tablet Commonly known as: NAMENDA TAKE 1 TABLET BY MOUTH TWICE A DAY   metoprolol succinate 25 MG 24 hr tablet Commonly known as: TOPROL-XL 02/29/16 TAKE 1 TABLET BY MOUTH DAILY   multivitamin tablet Take 1 tablet by mouth daily.   VITAMIN B-12 PO Take by mouth daily.   warfarin 2 MG tablet Commonly known as: COUMADIN Take as directed by the anticoagulation clinic. If you are unsure how to take this medication, talk to your nurse or doctor. Original instructions: TAKE 4 MG (2 TABLETS) EVERY DAY EXCEPT SATURDAY 6 MG (3 TABLETS)         Objective:   BP (!) 98/56   Pulse (!) 55   Ht 6' (1.829 m)   SpO2 97%   BMI 26.31 kg/m   Wt Readings from Last 3 Encounters:  01/25/21 194 lb (88 kg)  01/04/21 194 lb 3.2 oz (88.1 kg)  12/12/20 201 lb 4 oz (91.3 kg)    Physical Exam Vitals and nursing note reviewed.  Constitutional:      Appearance: Normal appearance.  Skin:    Findings: No bruising.  Neurological:     Mental Status: He is alert.    Results for orders placed or performed in visit on 02/02/21  CoaguChek XS/INR Waived  Result Value Ref Range   INR 1.8 (H) 0.9 - 1.1   Prothrombin Time 21.7 sec    Assessment & Plan:   Problem List Items Addressed This Visit       Cardiovascular and Mediastinum   Chronic atrial fibrillation ([place]) - Primary   Relevant Orders   CoaguChek XS/INR Waived (Completed)    Description   Likelihood is he missed some doses, increased from 1.6-1.8.  Continue taking 6 mg on every day except Mondays and Fridays, take 4 mg on Monday and Friday  INR 1.8 (goal 2.0-3.0) Follow-up in 3weeks     Follow up plan: Return in about 3 weeks (around 02/23/2021), or if symptoms worsen or fail to improve, for A. fib.  Counseling provided for all of the vaccine components Orders Placed This Encounter  Procedures   CoaguChek XS/INR [place] Lalanya Rufener, MD [facility] 02/02/2021, 3:42 PM

## 2021-02-02 NOTE — Telephone Encounter (Signed)
Wife called and aware bring him today at 300 pm - she has a OV and we can do at the same time

## 2021-02-03 DIAGNOSIS — I872 Venous insufficiency (chronic) (peripheral): Secondary | ICD-10-CM | POA: Diagnosis not present

## 2021-02-03 DIAGNOSIS — F0280 Dementia in other diseases classified elsewhere without behavioral disturbance: Secondary | ICD-10-CM | POA: Diagnosis not present

## 2021-02-03 DIAGNOSIS — I482 Chronic atrial fibrillation, unspecified: Secondary | ICD-10-CM | POA: Diagnosis not present

## 2021-02-03 DIAGNOSIS — E876 Hypokalemia: Secondary | ICD-10-CM | POA: Diagnosis not present

## 2021-02-03 DIAGNOSIS — G309 Alzheimer's disease, unspecified: Secondary | ICD-10-CM | POA: Diagnosis not present

## 2021-02-03 DIAGNOSIS — Z7901 Long term (current) use of anticoagulants: Secondary | ICD-10-CM | POA: Diagnosis not present

## 2021-02-03 DIAGNOSIS — Z87891 Personal history of nicotine dependence: Secondary | ICD-10-CM | POA: Diagnosis not present

## 2021-02-03 DIAGNOSIS — G629 Polyneuropathy, unspecified: Secondary | ICD-10-CM | POA: Diagnosis not present

## 2021-02-03 DIAGNOSIS — Z87440 Personal history of urinary (tract) infections: Secondary | ICD-10-CM | POA: Diagnosis not present

## 2021-02-03 DIAGNOSIS — Z8701 Personal history of pneumonia (recurrent): Secondary | ICD-10-CM | POA: Diagnosis not present

## 2021-02-03 DIAGNOSIS — Z89511 Acquired absence of right leg below knee: Secondary | ICD-10-CM | POA: Diagnosis not present

## 2021-02-15 ENCOUNTER — Encounter: Payer: Self-pay | Admitting: Family Medicine

## 2021-02-15 ENCOUNTER — Ambulatory Visit (INDEPENDENT_AMBULATORY_CARE_PROVIDER_SITE_OTHER): Payer: PPO | Admitting: Family Medicine

## 2021-02-15 ENCOUNTER — Other Ambulatory Visit: Payer: Self-pay

## 2021-02-15 VITALS — BP 113/65 | HR 57 | Ht 72.0 in | Wt 201.0 lb

## 2021-02-15 DIAGNOSIS — Z23 Encounter for immunization: Secondary | ICD-10-CM

## 2021-02-15 DIAGNOSIS — I482 Chronic atrial fibrillation, unspecified: Secondary | ICD-10-CM | POA: Diagnosis not present

## 2021-02-15 LAB — COAGUCHEK XS/INR WAIVED
INR: 2.3 — ABNORMAL HIGH (ref 0.9–1.1)
Prothrombin Time: 27.9 s

## 2021-02-15 NOTE — Addendum Note (Signed)
Addended by: Alphonzo Dublin on: 02/15/2021 02:51 PM   Modules accepted: Orders

## 2021-02-15 NOTE — Progress Notes (Signed)
BP 113/65   Pulse (!) 57   Ht 6' (1.829 m)   Wt 201 lb (91.2 kg)   SpO2 97%   BMI 27.26 kg/m    Subjective:   Patient ID: Brett Pearson., male    DOB: 27-Feb-1941, 80 y.o.   MRN: [id number]  HPI: Brett Pearson. is a 80 y.o. male presenting on 02/15/2021 for Atrial Fibrillation   HPI Coumadin recheck Target goal: 2.0-3.0 Reason on anticoagulation: Chronic A. fib Patient denies any bruising or bleeding or chest pain or palpitations   Relevant past medical, surgical, family and social history reviewed and updated as indicated. Interim medical history since our last visit reviewed. Allergies and medications reviewed and updated.  Review of Systems  Constitutional:  Negative for chills and fever.  Respiratory:  Negative for shortness of breath and wheezing.   Cardiovascular:  Negative for chest pain and leg swelling.  Musculoskeletal:  Negative for back pain and gait problem.  Skin:  Negative for rash.  Neurological:  Negative for dizziness, weakness and light-headedness.  Psychiatric/Behavioral:  Positive for confusion.   All other systems reviewed and are negative.  Per HPI unless specifically indicated above   Allergies as of 02/15/2021       Reactions   Amoxicillin    Other reaction(s): HIVES        Medication List        Accurate as of February 15, 2021  2:25 PM. If you have any questions, ask your nurse or doctor.          STOP taking these medications    donepezil 5 MG tablet Commonly known as: ARICEPT Stopped by: Worthy Rancher, MD   memantine 5 MG tablet Commonly known as: NAMENDA Stopped by: [name] Eissa Buchberger, MD       TAKE these medications    cholecalciferol 1000 units tablet Commonly known as: VITAMIN D Take 1,000 Units by mouth daily.   gabapentin 300 MG capsule Commonly known as: NEURONTIN Take 3 capsules (900 mg total) by mouth 2 (two) times daily.   Magnesium Gluconate 500 (27 Mg) MG Tabs Take 500 mg by mouth.    metoprolol succinate 25 MG 24 hr tablet Commonly known as: TOPROL-XL 02/29/16 TAKE 1 TABLET BY MOUTH DAILY   multivitamin tablet Take 1 tablet by mouth daily.   VITAMIN B-12 PO Take by mouth daily.   warfarin 2 MG tablet Commonly known as: COUMADIN Take as directed by the anticoagulation clinic. If you are unsure how to take this medication, talk to your nurse or doctor. Original instructions: TAKE 4 MG (2 TABLETS) EVERY DAY EXCEPT SATURDAY 6 MG (3 TABLETS)         Objective:   BP 113/65   Pulse (!) 57   Ht 6' (1.829 m)   Wt 201 lb (91.2 kg)   SpO2 97%   BMI 27.26 kg/m   Wt Readings from Last 3 Encounters:  02/15/21 201 lb (91.2 kg)  01/25/21 194 lb (88 kg)  01/04/21 194 lb 3.2 oz (88.1 kg)    Physical Exam Vitals and nursing note reviewed.  Constitutional:      General: He is not in acute distress.    Appearance: He is well-developed. He is not diaphoretic.  Eyes:     General: No scleral icterus.    Conjunctiva/sclera: Conjunctivae normal.  Neck:     Thyroid: No thyromegaly.  Skin:    General: Skin is warm and dry.  Findings: No rash.  Neurological:     Mental Status: He is alert and oriented to person, place, and time.     Coordination: Coordination normal.  Psychiatric:        Behavior: Behavior normal.      Assessment & Plan:   Problem List Items Addressed This Visit       Cardiovascular and Mediastinum   Chronic atrial fibrillation ([place]) - Primary   Relevant Orders   CoaguChek XS/INR Waived    Description   Continue taking 6 mg on every day except Mondays and Fridays, take 4 mg on Monday and Friday  INR 2.3 (goal 2.0-3.0) Follow-up in 4 to 6 weeks    Patient's wife says his memory is getting worse and that the memory medicines are not helping so they would go ahead and stop them.   Follow up plan: Return if symptoms worsen or fail to improve, for 4 to 6-week INR recheck.  Counseling provided for all of the vaccine  components Orders Placed This Encounter  Procedures   CoaguChek XS/INR [place] Brendaliz Kuk, MD [facility] 02/15/2021, 2:25 PM

## 2021-02-17 ENCOUNTER — Telehealth: Payer: Self-pay | Admitting: Family Medicine

## 2021-02-17 NOTE — Telephone Encounter (Signed)
   Telephone encounter was:  Successful.  02/17/2021 Name: Brett Pearson. MRN: [id number] DOB: 1940-10-25  Brett Pearson. is a 80 y.o. year old male who is a primary care patient of [name], [name], MD . The community resource team was consulted for assistance with Transportation Needs   Care guide performed the following interventions:  sent email with transportation resources to pt wife, [name] at [email or web address] per pt .  Follow Up Plan:  No further follow up planned at this time. The patient has been provided with needed resources.  April Green Care Guide, Embedded Care Coordination [place], Care Management Phone: [phone] Email: [email or web address]

## 2021-02-21 DIAGNOSIS — N3945 Continuous leakage: Secondary | ICD-10-CM | POA: Diagnosis not present

## 2021-02-27 ENCOUNTER — Ambulatory Visit: Payer: PPO | Admitting: Nurse Practitioner

## 2021-03-02 ENCOUNTER — Other Ambulatory Visit: Payer: Self-pay | Admitting: Family Medicine

## 2021-03-06 DIAGNOSIS — G629 Polyneuropathy, unspecified: Secondary | ICD-10-CM | POA: Diagnosis not present

## 2021-03-06 DIAGNOSIS — E876 Hypokalemia: Secondary | ICD-10-CM | POA: Diagnosis not present

## 2021-03-06 DIAGNOSIS — Z8701 Personal history of pneumonia (recurrent): Secondary | ICD-10-CM | POA: Diagnosis not present

## 2021-03-06 DIAGNOSIS — I872 Venous insufficiency (chronic) (peripheral): Secondary | ICD-10-CM | POA: Diagnosis not present

## 2021-03-06 DIAGNOSIS — Z87891 Personal history of nicotine dependence: Secondary | ICD-10-CM | POA: Diagnosis not present

## 2021-03-06 DIAGNOSIS — Z87440 Personal history of urinary (tract) infections: Secondary | ICD-10-CM | POA: Diagnosis not present

## 2021-03-06 DIAGNOSIS — F0280 Dementia in other diseases classified elsewhere without behavioral disturbance: Secondary | ICD-10-CM | POA: Diagnosis not present

## 2021-03-06 DIAGNOSIS — I482 Chronic atrial fibrillation, unspecified: Secondary | ICD-10-CM | POA: Diagnosis not present

## 2021-03-06 DIAGNOSIS — Z7901 Long term (current) use of anticoagulants: Secondary | ICD-10-CM | POA: Diagnosis not present

## 2021-03-06 DIAGNOSIS — G309 Alzheimer's disease, unspecified: Secondary | ICD-10-CM | POA: Diagnosis not present

## 2021-03-06 DIAGNOSIS — Z89511 Acquired absence of right leg below knee: Secondary | ICD-10-CM | POA: Diagnosis not present

## 2021-03-08 ENCOUNTER — Telehealth: Payer: Self-pay | Admitting: Family Medicine

## 2021-03-08 ENCOUNTER — Other Ambulatory Visit: Payer: Self-pay | Admitting: Family Medicine

## 2021-03-08 DIAGNOSIS — R531 Weakness: Secondary | ICD-10-CM | POA: Diagnosis not present

## 2021-03-08 DIAGNOSIS — Z7901 Long term (current) use of anticoagulants: Secondary | ICD-10-CM

## 2021-03-08 DIAGNOSIS — I482 Chronic atrial fibrillation, unspecified: Secondary | ICD-10-CM

## 2021-03-08 NOTE — Telephone Encounter (Signed)
Pt's SO stated that he had become very weak and progressively gotten weaker over the last couple of weeks. She said that she is unable to get him to eat. Most days he does not want to eat anything other than a [employer] cheeseburger at lunch and will not eat dinner. Also, he is unable to move from bed to wheelchair and vise versa, without having help from someone and his SO said that it is hard for her to assist him with doing so.

## 2021-03-08 NOTE — Telephone Encounter (Signed)
Unfortunately the 2 best option for her at this point to get help or more help is to place him in a nursing facility or talk to palliative care, if she would like to discuss either of these options or need information for either of these options then please let me know

## 2021-03-08 NOTE — Telephone Encounter (Signed)
Please advise on change of coumadin dosage needing new Rx to pharmacy per last INR check

## 2021-03-08 NOTE — Telephone Encounter (Signed)
Patient takes medicine as follows: 6 mg every day except Tues and Thursday he takes 4 mg.  Please call in to [facility]

## 2021-03-09 MED ORDER — WARFARIN SODIUM 2 MG PO TABS
ORAL_TABLET | ORAL | 1 refills | Status: DC
Start: 2021-03-09 — End: 2021-05-31

## 2021-03-09 NOTE — Telephone Encounter (Signed)
Wife has been informed of Dr. Merita Norton recommendations.  She would like for pt to be placed somewhere for a couple of weeks to regain his strength and eat better.  Informed wife that it would be a more permanent stay for palliative care or nursing facility. Wife states that she will have to think about this and call back.

## 2021-03-13 ENCOUNTER — Ambulatory Visit (INDEPENDENT_AMBULATORY_CARE_PROVIDER_SITE_OTHER): Payer: PPO | Admitting: Licensed Clinical Social Worker

## 2021-03-13 DIAGNOSIS — I482 Chronic atrial fibrillation, unspecified: Secondary | ICD-10-CM | POA: Diagnosis not present

## 2021-03-13 DIAGNOSIS — S88111A Complete traumatic amputation at level between knee and ankle, right lower leg, initial encounter: Secondary | ICD-10-CM

## 2021-03-13 DIAGNOSIS — R413 Other amnesia: Secondary | ICD-10-CM

## 2021-03-13 DIAGNOSIS — E538 Deficiency of other specified B group vitamins: Secondary | ICD-10-CM

## 2021-03-13 DIAGNOSIS — I10 Essential (primary) hypertension: Secondary | ICD-10-CM

## 2021-03-13 DIAGNOSIS — K219 Gastro-esophageal reflux disease without esophagitis: Secondary | ICD-10-CM

## 2021-03-13 NOTE — Chronic Care Management (AMB) (Signed)
Chronic Care Management    Clinical Social Work Note  03/13/2021 Name: Brett Pearson. MRN: [id number] DOB: 1941-04-01  Brett Duffel. is a 80 y.o. year old male who is a primary care patient of [name], [name], MD. The CCM team was consulted to assist the patient with chronic disease management and/or care coordination needs related to: [name] .   Engaged with patient /Brett Pearson, significant other, by telephone for follow up visit in response to provider referral for social work chronic care management and care coordination services.   Consent to Services:  The patient was given information about Chronic Care Management services, agreed to services, and gave verbal consent prior to initiation of services.  Please see initial visit note for detailed documentation.   Patient agreed to services and consent obtained.   Assessment: Review of patient past medical history, allergies, medications, and health status, including review of relevant consultants reports was performed today as part of a comprehensive evaluation and provision of chronic care management and care coordination services.     SDOH (Social Determinants of Health) assessments and interventions performed: Patient has flat affect, decreased motivation. Taking medications as prescribed SDOH Interventions    Flowsheet Row Most Recent Value  SDOH Interventions   Depression Interventions/Treatment  --  [informed significant other of LCSW support and of RNCM support]        Advanced Directives Status: See Vynca application for related entries.  CCM Care Plan  Allergies  Allergen Reactions   Amoxicillin     Other reaction(s): HIVES    Outpatient Encounter Medications as of 03/13/2021  Medication Sig Note   cholecalciferol (VITAMIN D) 1000 UNITS tablet Take 1,000 Units by mouth daily.    Cyanocobalamin (VITAMIN B-12 PO) Take by mouth daily.    gabapentin (NEURONTIN) 300 MG capsule Take 3 capsules  (900 mg total) by mouth 2 (two) times daily.    Magnesium Gluconate 500 (27 Mg) MG TABS Take 500 mg by mouth. 07/11/2016: Received from: [facility] Received Sig: Take 500 mg by mouth.   metoprolol succinate (TOPROL-XL) 25 MG 24 hr tablet 02/29/16 TAKE 1 TABLET BY MOUTH DAILY    Multiple Vitamin (MULTIVITAMIN) tablet Take 1 tablet by mouth daily.    warfarin (COUMADIN) 2 MG tablet 6 mg on every day except Mondays and Fridays, take 4 mg on Monday and Friday    No facility-administered encounter medications on file as of 03/13/2021.    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   Gastroesophageal reflux disease without esophagitis 03/20/2018   History of colon polyps 03/20/2018   Chronic pain syndrome 01/28/2018   Gait abnormality 10/14/2017   Vitamin D deficiency 10/14/2017   Astigmatism with presbyopia, bilateral 04/16/2017   Dry eye syndrome of both lacrimal glands 03/28/2017   Pseudophakia of both eyes 03/28/2017   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   Bronchiectasis without complication ([place]) [id number]   Interstitial lung disease ([place]) 02/08/2017   Abnormal CT of the chest 01/30/2017  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   Memory loss 08/03/2016   Vitamin B12 deficiency 02/13/2016   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/2015   Essential hypertension 07/13/2015   Obesity  (BMI 30-39.9) 07/13/2015   Chronic atrial fibrillation ([place]) 11/06/2012   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    Conditions to be addressed/monitored: monitor client completion of ADLs as he is able  Care Plan : [place]  Updates made by [name], LCSW since 03/13/2021 12:00 AM     Problem: Coping Skills (General Plan of Care)      Goal: Coping Skills Enhanced: Complete ADLs daily, as able   Start Date: 03/13/2021  Expected End Date: 06/13/2021  This Visit's Progress: On track  Recent Progress: On track  Priority: Medium  Note:   Current barriers:   Patient in need of assistance with connecting to community resources for help in completing ADLs Patient is unable to independently navigate community resource options without care coordination support Mobility issues Memory issues  Clinical Goals:   Patient will communicate with LCSW in next 30 days to discuss ADLs completion of client Patient will communicate will RNCM as needed in next 30 days for nursing support  Clinical Interventions:  Collaboration with [name], [name], MD regarding development and update of comprehensive plan of care as evidenced by provider attestation and co-signature Assessment of needs, barriers of client  Talked with [name] , Significant Other, about RNCM support for client Talked with [name] about CCM program support Talked with [name] about memory issues of client Talked with [name] about client completion of ADLs (she said he does a sponge bath daily and often it may take about 2 hours to complete) Talked with [name] about hearing needs of client Talked with [name] about sleeping issues of client Talked with [name] about incontinency issues of client Talked with [name] about fact that client uses a Patent attorney for mobility Talked with [name] about decreased appetite of client (she said client has had  1 Boost shake today) Talked with [name] about mood of client (she said client has a flat affect) Talked with [name] about client daily needs Talked with [name] about placement options regarding needs of client. She said she is interested in [facility] in [place] , [place]. She said she needs to call that facility and talk with them about availability of space at that facility Talked with [name] about family support for client. She said she is the main support person for client Encouraged [name] to call RNCM or LCSW as needed for CCM support for client needs  Patient Coping Skills: Has support from [facility] Attends scheduled medical appointments  Patient Deficits:  Memory issues Mobility issues  Patient Goals: In next 30 days, client will Attend scheduled medical appointments Take medications as prescribed Talk regularly with [name] about client needs -  Follow Up Plan: LCSW to call client  or [name] on 04/21/21      Norva Riffle.Kavon Valenza MSW, LCSW Licensed Clinical Social Worker [facility] Care Management [phone]

## 2021-03-13 NOTE — Patient Instructions (Signed)
Visit Information  PATIENT GOALS:  Goals Addressed             This Visit's Progress    Manage My Emotions; Complete ADLs daily, as able       Timeframe:  Short-Term Goal Priority:  Medium Progress: On Track Start Date:             03/13/21                Expected End Date:           06/13/21            Follow Up Date 04/21/21   Manage Emotions: Complete ADLs daily as able    Why is this important?   When you are stressed, down or upset, your body reacts too.  For example, your blood pressure may get higher; you may have a headache or stomachache.  When your emotions get the best of you, your body's ability to fight off cold and flu gets weak.  These steps will help you manage your emotions.     Patient Coping Skills: Has support from [facility] Attends scheduled medical appointments  Patient Deficits:  Memory issues Mobility issues  Patient Goals: In next 30 days, client will Attend scheduled medical appointments Take medications as prescribed Talk regularly with [name] about client needs -  Follow Up Plan: LCSW to call client  or [name] on 04/21/21     Norva Riffle.Avary Pitsenbarger MSW, LCSW Licensed Clinical Social Worker [facility] Care Management [phone]

## 2021-03-15 ENCOUNTER — Encounter: Payer: Self-pay | Admitting: Family Medicine

## 2021-03-15 ENCOUNTER — Ambulatory Visit (INDEPENDENT_AMBULATORY_CARE_PROVIDER_SITE_OTHER): Payer: PPO | Admitting: Family Medicine

## 2021-03-15 ENCOUNTER — Other Ambulatory Visit: Payer: Self-pay

## 2021-03-15 DIAGNOSIS — R059 Cough, unspecified: Secondary | ICD-10-CM

## 2021-03-15 NOTE — Progress Notes (Signed)
Virtual Visit via Telephone Note  I connected with Brett Duffel. on 03/15/21 at 1:36 PM by telephone and verified that I am speaking with the correct person using two identifiers. Brett Duffel. is currently located at home and his life partner, Brett Pearson, is currently with him during this visit. The provider, Loman Brooklyn, FNP is located in their office at time of visit.  I discussed the limitations, risks, security and privacy concerns of performing an evaluation and management service by telephone and the availability of in person appointments. I also discussed with the patient that there may be a patient responsible charge related to this service. The patient expressed understanding and agreed to proceed.  Subjective: PCP: [name], [name], MD  Chief Complaint  Patient presents with   URI   Patient complains of cough. Onset of symptoms was this morning. He only had a cough this morning, which has since resolved.  He is drinking plenty of fluids. Evaluation to date: none. Treatment to date:  one dose of antibiotic, tessalon perle, and Tylenol . He has a history of interstitial lung disease. He does not smoke.    ROS: Per HPI  Current Outpatient Medications:    cholecalciferol (VITAMIN D) 1000 UNITS tablet, Take 1,000 Units by mouth daily., Disp: , Rfl:    Cyanocobalamin (VITAMIN B-12 PO), Take by mouth daily., Disp: , Rfl:    gabapentin (NEURONTIN) 300 MG capsule, Take 3 capsules (900 mg total) by mouth 2 (two) times daily., Disp: 180 capsule, Rfl: 3   Magnesium Gluconate 500 (27 Mg) MG TABS, Take 500 mg by mouth., Disp: , Rfl:    metoprolol succinate (TOPROL-XL) 25 MG 24 hr tablet, 02/29/16 TAKE 1 TABLET BY MOUTH DAILY, Disp: 90 tablet, Rfl: 0   Multiple Vitamin (MULTIVITAMIN) tablet, Take 1 tablet by mouth daily., Disp: , Rfl:    warfarin (COUMADIN) 2 MG tablet, 6 mg on every day except Mondays and Fridays, take 4 mg on Monday and Friday, Disp: 200 tablet, Rfl:  1  Allergies  Allergen Reactions   Amoxicillin     Other reaction(s): HIVES   Past Medical History:  Diagnosis Date   Atrial fibrillation ([place])    Hypokalemia    Neuropathy    bilateral feet    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 Cough Declined COVID and flu testing. Discussed symptom management.    Follow Up Instructions:  I discussed the assessment and treatment plan with the patient. The patient was provided an opportunity to ask questions and all were answered. The patient agreed with the plan and demonstrated an understanding of the instructions.   The patient was advised to call back or seek an in-person evaluation if the symptoms worsen or if the condition fails to improve as anticipated.  The above assessment and management plan was discussed with the patient. The patient verbalized understanding of and has agreed to the management plan. Patient is aware to call the clinic if symptoms persist or worsen. Patient is aware when to return to the clinic for a follow-up visit. Patient educated on when it is appropriate to go to the emergency department.   Time call ended: 1:47 PM  I provided 11 minutes of non-face-to-face time during this encounter.  Hendricks Limes, MSN,  APRN, FNP-C [facility] 03/15/21

## 2021-03-17 ENCOUNTER — Telehealth: Payer: Self-pay | Admitting: Family Medicine

## 2021-03-17 MED ORDER — METOPROLOL SUCCINATE ER 25 MG PO TB24: ORAL_TABLET | ORAL | 0 refills | Status: DC

## 2021-03-17 NOTE — Telephone Encounter (Signed)
  Prescription Request  03/17/2021  What is the name of the medication or equipment? metoprolol succinate (TOPROL-XL) 25 MG 24 hr tablet  Have you contacted your pharmacy to request a refill? (if applicable) no  Which pharmacy would you like this sent to? [facility]   Patient notified that their request is being sent to the clinical staff for review and that they should receive a response within 2 business days.

## 2021-03-17 NOTE — Telephone Encounter (Signed)
Refill sent to pharmacy, patient aware ?

## 2021-03-24 DIAGNOSIS — N3945 Continuous leakage: Secondary | ICD-10-CM | POA: Diagnosis not present

## 2021-03-29 ENCOUNTER — Other Ambulatory Visit: Payer: Self-pay

## 2021-03-29 ENCOUNTER — Ambulatory Visit (INDEPENDENT_AMBULATORY_CARE_PROVIDER_SITE_OTHER): Payer: PPO

## 2021-03-29 DIAGNOSIS — F0280 Dementia in other diseases classified elsewhere without behavioral disturbance: Secondary | ICD-10-CM | POA: Diagnosis not present

## 2021-03-29 DIAGNOSIS — G629 Polyneuropathy, unspecified: Secondary | ICD-10-CM | POA: Diagnosis not present

## 2021-03-29 DIAGNOSIS — Z89511 Acquired absence of right leg below knee: Secondary | ICD-10-CM | POA: Diagnosis not present

## 2021-03-29 DIAGNOSIS — Z8701 Personal history of pneumonia (recurrent): Secondary | ICD-10-CM | POA: Diagnosis not present

## 2021-03-29 DIAGNOSIS — I872 Venous insufficiency (chronic) (peripheral): Secondary | ICD-10-CM | POA: Diagnosis not present

## 2021-03-29 DIAGNOSIS — Z87891 Personal history of nicotine dependence: Secondary | ICD-10-CM

## 2021-03-29 DIAGNOSIS — I482 Chronic atrial fibrillation, unspecified: Secondary | ICD-10-CM | POA: Diagnosis not present

## 2021-03-29 DIAGNOSIS — Z87440 Personal history of urinary (tract) infections: Secondary | ICD-10-CM

## 2021-03-29 DIAGNOSIS — G309 Alzheimer's disease, unspecified: Secondary | ICD-10-CM

## 2021-03-29 DIAGNOSIS — E876 Hypokalemia: Secondary | ICD-10-CM

## 2021-03-29 DIAGNOSIS — Z7901 Long term (current) use of anticoagulants: Secondary | ICD-10-CM

## 2021-04-07 ENCOUNTER — Encounter: Payer: Self-pay | Admitting: Family Medicine

## 2021-04-07 ENCOUNTER — Ambulatory Visit (INDEPENDENT_AMBULATORY_CARE_PROVIDER_SITE_OTHER): Payer: PPO | Admitting: Family Medicine

## 2021-04-07 ENCOUNTER — Other Ambulatory Visit: Payer: Self-pay

## 2021-04-07 VITALS — BP 120/57 | HR 58 | Ht 72.0 in | Wt 202.0 lb

## 2021-04-07 DIAGNOSIS — R413 Other amnesia: Secondary | ICD-10-CM

## 2021-04-07 DIAGNOSIS — I482 Chronic atrial fibrillation, unspecified: Secondary | ICD-10-CM | POA: Diagnosis not present

## 2021-04-07 DIAGNOSIS — I10 Essential (primary) hypertension: Secondary | ICD-10-CM | POA: Diagnosis not present

## 2021-04-07 LAB — COAGUCHEK XS/INR WAIVED
INR: 2.4 — ABNORMAL HIGH (ref 0.9–1.1)
Prothrombin Time: 28.3 s

## 2021-04-07 NOTE — Progress Notes (Signed)
BP (!) 120/57   Pulse (!) 58   Ht 6' (1.829 m)   Wt 202 lb (91.6 kg)   SpO2 98%   BMI 27.40 kg/m    Subjective:   Patient ID: Brett Duffel., male    DOB: [birth year], 80 y.o.   MRN: [id number]  HPI: Brett Broad. is a 80 y.o. male presenting on 04/07/2021 for Chronic atrial fibrillation   HPI Coumadin recheck Target goal: 2.0-3.0 Reason on anticoagulation: Chronic A. fib Patient denies any bruising or bleeding or chest pain or palpitations   Patient has been having increasing memory issues on top of his dementia and his wife is concerned because she feels like it is getting worse a lot quicker than she would expect and she thinks he might be having TIAs.  She wants to go ahead and do an MRI to see.  He denies any focal numbness or weakness that is new.  Patient also comes in for recheck of chronic medical issues and blood work for that including blood pressure and checking his cholesterol.  Blood pressure is good at 120/57.  Relevant past medical, surgical, family and social history reviewed and updated as indicated. Interim medical history since our last visit reviewed. Allergies and medications reviewed and updated.  Review of Systems  Constitutional:  Negative for chills and fever.  Respiratory:  Negative for shortness of breath and wheezing.   Cardiovascular:  Negative for chest pain and leg swelling.  Musculoskeletal:  Positive for gait problem. Negative for back pain.  Skin:  Negative for rash.  Neurological:  Negative for dizziness, speech difficulty and light-headedness.  Psychiatric/Behavioral:  Positive for confusion. Negative for self-injury and sleep disturbance. The patient is nervous/anxious.   All other systems reviewed and are negative.  Per HPI unless specifically indicated above   Allergies as of 04/07/2021       Reactions   Amoxicillin    Other reaction(s): HIVES        Medication List        Accurate as of April 07, 2021  3:17 PM. If  you have any questions, ask your nurse or doctor.          cholecalciferol 1000 units tablet Commonly known as: VITAMIN D Take 1,000 Units by mouth daily.   gabapentin 300 MG capsule Commonly known as: NEURONTIN Take 3 capsules (900 mg total) by mouth 2 (two) times daily.   Magnesium Gluconate 500 (27 Mg) MG Tabs Take 500 mg by mouth.   metoprolol succinate 25 MG 24 hr tablet Commonly known as: TOPROL-XL 02/29/16 TAKE 1 TABLET BY MOUTH DAILY   multivitamin tablet Take 1 tablet by mouth daily.   VITAMIN B-12 PO Take by mouth daily.   warfarin 2 MG tablet Commonly known as: COUMADIN Take as directed by the anticoagulation clinic. If you are unsure how to take this medication, talk to your nurse or doctor. Original instructions: 6 mg on every day except Mondays and Fridays, take 4 mg on Monday and Friday         Objective:   BP (!) 120/57   Pulse (!) 58   Ht 6' (1.829 m)   Wt 202 lb (91.6 kg)   SpO2 98%   BMI 27.40 kg/m   Wt Readings from Last 3 Encounters:  04/07/21 202 lb (91.6 kg)  02/15/21 201 lb (91.2 kg)  01/25/21 194 lb (88 kg)    Physical Exam Vitals and nursing note reviewed.  Constitutional:  General: He is not in acute distress.    Appearance: He is well-developed. He is not diaphoretic.  Eyes:     General: No scleral icterus.    Conjunctiva/sclera: Conjunctivae normal.  Neck:     Thyroid: No thyromegaly.  Cardiovascular:     Rate and Rhythm: Normal rate and regular rhythm.     Heart sounds: Normal heart sounds. No murmur heard. Pulmonary:     Effort: Pulmonary effort is normal. No respiratory distress.     Breath sounds: Normal breath sounds. No wheezing.  Musculoskeletal:        General: Normal range of motion.     Cervical back: Neck supple.  Lymphadenopathy:     Cervical: No cervical adenopathy.  Skin:    General: Skin is warm and dry.     Findings: No rash.  Neurological:     Mental Status: He is alert and oriented to person,  place, and time.     Coordination: Coordination normal.  Psychiatric:        Behavior: Behavior normal.    Description   Continue taking 6 mg on every day except Mondays and Fridays, take 4 mg on Monday and Friday  INR 2.4 (goal 2.0-3.0) Follow-up in 4 to 6 weeks      Assessment & Plan:   Problem List Items Addressed This Visit       Cardiovascular and Mediastinum   Chronic atrial fibrillation ([place]) - Primary   Relevant Orders   CoaguChek XS/INR Waived   Essential hypertension   Relevant Orders   CMP14+EGFR   CBC with Differential/Platelet   Lipid panel     Other   Memory loss   Relevant Orders   MR Brain Wo Contrast   Other Visit Diagnoses     Memory changes       Relevant Orders   MR Brain Wo Contrast       Wife is concerned about acute memory loss and thinks is gotten a lot worse and would like a MRI.  Will order MRI.  Recommended that he continue current medicines. Description   Continue taking 6 mg on every day except Mondays and Fridays, take 4 mg on Monday and Friday  INR 2.4 (goal 2.0-3.0) Follow-up in 4 to 6 weeks     Follow up plan: Return if symptoms worsen or fail to improve, for 6 to 8-week INR follow-up.  Counseling provided for all of the vaccine components Orders Placed This Encounter  Procedures   MR Brain Wo Contrast   CoaguChek XS/INR Waived   CMP14+EGFR   CBC with Differential/Platelet   Lipid panel    Caryl Pina, MD [facility] 04/07/2021, 3:17 PM

## 2021-04-08 LAB — CBC WITH DIFFERENTIAL/PLATELET
Basophils Absolute: 0 10*3/uL (ref 0.0–0.2)
Basos: 0 %
EOS (ABSOLUTE): 0.4 10*3/uL (ref 0.0–0.4)
Eos: 5 %
Hematocrit: 36.5 % — ABNORMAL LOW (ref 37.5–51.0)
Hemoglobin: 12.4 g/dL — ABNORMAL LOW (ref 13.0–17.7)
Immature Grans (Abs): 0 10*3/uL (ref 0.0–0.1)
Immature Granulocytes: 0 %
Lymphocytes Absolute: 1.7 10*3/uL (ref 0.7–3.1)
Lymphs: 22 %
MCH: 33.6 pg — ABNORMAL HIGH (ref 26.6–33.0)
MCHC: 34 g/dL (ref 31.5–35.7)
MCV: 99 fL — ABNORMAL HIGH (ref 79–97)
Monocytes Absolute: 0.8 10*3/uL (ref 0.1–0.9)
Monocytes: 10 %
Neutrophils Absolute: 4.8 10*3/uL (ref 1.4–7.0)
Neutrophils: 63 %
Platelets: 173 10*3/uL (ref 150–450)
RBC: 3.69 x10E6/uL — ABNORMAL LOW (ref 4.14–5.80)
RDW: 13.3 % (ref 11.6–15.4)
WBC: 7.6 10*3/uL (ref 3.4–10.8)

## 2021-04-08 LAB — CMP14+EGFR
ALT: 9 IU/L (ref 0–44)
AST: 19 IU/L (ref 0–40)
Albumin/Globulin Ratio: 1.1 — ABNORMAL LOW (ref 1.2–2.2)
Albumin: 3.5 g/dL — ABNORMAL LOW (ref 3.7–4.7)
Alkaline Phosphatase: 85 IU/L (ref 44–121)
BUN/Creatinine Ratio: 14 (ref 10–24)
BUN: 15 mg/dL (ref 8–27)
Bilirubin Total: 0.4 mg/dL (ref 0.0–1.2)
CO2: 27 mmol/L (ref 20–29)
Calcium: 9.3 mg/dL (ref 8.6–10.2)
Chloride: 104 mmol/L (ref 96–106)
Creatinine, Ser: 1.11 mg/dL (ref 0.76–1.27)
Globulin, Total: 3.2 g/dL (ref 1.5–4.5)
Glucose: 82 mg/dL (ref 65–99)
Potassium: 4.9 mmol/L (ref 3.5–5.2)
Sodium: 142 mmol/L (ref 134–144)
Total Protein: 6.7 g/dL (ref 6.0–8.5)
eGFR: 67 mL/min/{1.73_m2} (ref >59)

## 2021-04-08 LAB — LIPID PANEL
Chol/HDL Ratio: 2.6 ratio (ref 0.0–5.0)
Cholesterol, Total: 123 mg/dL (ref 100–199)
HDL: 47 mg/dL (ref >39)
LDL Chol Calc (NIH): 57 mg/dL (ref 0–99)
Triglycerides: 102 mg/dL (ref 0–149)
VLDL Cholesterol Cal: 19 mg/dL (ref 5–40)

## 2021-04-11 ENCOUNTER — Telehealth: Payer: Self-pay | Admitting: Family Medicine

## 2021-04-15 ENCOUNTER — Other Ambulatory Visit: Payer: Self-pay

## 2021-04-15 ENCOUNTER — Ambulatory Visit (HOSPITAL_BASED_OUTPATIENT_CLINIC_OR_DEPARTMENT_OTHER)
Admission: RE | Admit: 2021-04-15 | Discharge: 2021-04-15 | Disposition: A | Discharge status: Home / Self Care | Payer: PPO | Source: Ambulatory Visit | Attending: Family Medicine | Admitting: Family Medicine

## 2021-04-15 DIAGNOSIS — R413 Other amnesia: Secondary | ICD-10-CM | POA: Insufficient documentation

## 2021-04-17 ENCOUNTER — Telehealth: Payer: Self-pay | Admitting: Family Medicine

## 2021-04-17 NOTE — Telephone Encounter (Signed)
  Prescription Request  04/17/2021  Is this a "Controlled Substance" medicine? No  Have you seen your PCP in the last 2 weeks? yes If YES, route message to pool  -  If NO, patient needs to be scheduled for appointment.  What is the name of the medication or equipment? Donepezil  Have you contacted your pharmacy to request a refill? no  Which pharmacy would you like this sent to? [facility]   Patient notified that their request is being sent to the clinical staff for review and that they should receive a response within 2 business days.    Brett Pearson pt.  Please call wife, when it is called in.

## 2021-04-17 NOTE — Telephone Encounter (Signed)
I do not see on patients current medication list. Please advise

## 2021-04-18 MED ORDER — DONEPEZIL HCL 10 MG PO TABS: 10.0000 mg | ORAL_TABLET | Freq: Every day | ORAL | 1 refills | Status: DC

## 2021-04-18 NOTE — Telephone Encounter (Signed)
Sent donepezil for the patient

## 2021-04-19 ENCOUNTER — Ambulatory Visit: Payer: PPO | Admitting: Family Medicine

## 2021-04-21 ENCOUNTER — Ambulatory Visit (INDEPENDENT_AMBULATORY_CARE_PROVIDER_SITE_OTHER): Payer: PPO | Admitting: Licensed Clinical Social Worker

## 2021-04-21 DIAGNOSIS — I10 Essential (primary) hypertension: Secondary | ICD-10-CM

## 2021-04-21 DIAGNOSIS — K219 Gastro-esophageal reflux disease without esophagitis: Secondary | ICD-10-CM

## 2021-04-21 DIAGNOSIS — E538 Deficiency of other specified B group vitamins: Secondary | ICD-10-CM

## 2021-04-21 DIAGNOSIS — I482 Chronic atrial fibrillation, unspecified: Secondary | ICD-10-CM

## 2021-04-21 DIAGNOSIS — R413 Other amnesia: Secondary | ICD-10-CM

## 2021-04-21 DIAGNOSIS — S88111A Complete traumatic amputation at level between knee and ankle, right lower leg, initial encounter: Secondary | ICD-10-CM

## 2021-04-21 NOTE — Chronic Care Management (AMB) (Signed)
Chronic Care Management    Clinical Social Work Note  04/21/2021 Name: Brett Pearson. MRN: [id number] DOB: 1941-03-02  Brett Pearson. is a 80 y.o. year old male who is a primary care patient of [name], [name], MD. The CCM team was consulted to assist the patient with chronic disease management and/or care coordination needs related to: [name] .   Engaged with patient /significant other, [name], by telephone for follow up visit in response to provider referral for social work chronic care management and care coordination services.   Consent to Services:  The patient was given information about Chronic Care Management services, agreed to services, and gave verbal consent prior to initiation of services.  Please see initial visit note for detailed documentation.   Patient agreed to services and consent obtained.   Assessment: Review of patient past medical history, allergies, medications, and health status, including review of relevant consultants reports was performed today as part of a comprehensive evaluation and provision of chronic care management and care coordination services.     SDOH (Social Determinants of Health) assessments and interventions performed:  SDOH Interventions    Flowsheet Row Most Recent Value  SDOH Interventions   Physical Activity Interventions Other (Comments)  [uses a wheelchair for ambulation]  Depression Interventions/Treatment  Counseling        Advanced Directives Status: See Vynca application for related entries.  CCM Care Plan  Allergies  Allergen Reactions   Amoxicillin     Other reaction(s): HIVES    Outpatient Encounter Medications as of 04/21/2021  Medication Sig Note   cholecalciferol (VITAMIN D) 1000 UNITS tablet Take 1,000 Units by mouth daily.    Cyanocobalamin (VITAMIN B-12 PO) Take by mouth daily.    donepezil (ARICEPT) 10 MG tablet Take 1 tablet (10 mg total) by mouth at bedtime.    gabapentin  (NEURONTIN) 300 MG capsule Take 3 capsules (900 mg total) by mouth 2 (two) times daily.    Magnesium Gluconate 500 (27 Mg) MG TABS Take 500 mg by mouth. 07/11/2016: Received from: [facility] Received Sig: Take 500 mg by mouth.   metoprolol succinate (TOPROL-XL) 25 MG 24 hr tablet 02/29/16 TAKE 1 TABLET BY MOUTH DAILY    Multiple Vitamin (MULTIVITAMIN) tablet Take 1 tablet by mouth daily.    warfarin (COUMADIN) 2 MG tablet 6 mg on every day except Mondays and Fridays, take 4 mg on Monday and Friday    No facility-administered encounter medications on file as of 04/21/2021.    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   Gastroesophageal reflux disease without esophagitis 03/20/2018   History of colon polyps 03/20/2018   Chronic pain syndrome 01/28/2018   Gait abnormality 10/14/2017   Vitamin D deficiency 10/14/2017   Astigmatism with presbyopia, bilateral 04/16/2017   Dry eye syndrome of both lacrimal glands 03/28/2017   Pseudophakia of both eyes 03/28/2017   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   Bronchiectasis without complication ([place]) [id number]   Interstitial lung disease ([place]) 02/08/2017  Abnormal CT of the chest 01/30/2017   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   Memory loss 08/03/2016   Vitamin B12 deficiency 02/13/2016   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    Essential hypertension 07/13/2015   Obesity (BMI 30-39.9) 07/13/2015   Chronic atrial fibrillation ([place]) 11/06/2012   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[place]) 08/09/2011   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/2011    Conditions to be addressed/monitored: monitor client completion of ADLs as he is able  Care Plan : [place]  Updates made by [name], LCSW since 04/21/2021 12:00 AM     Problem: Coping Skills (General Plan of Care)      Goal: Coping Skills Enhanced: Complete ADLs daily, as able   Start Date: 03/13/2021  Expected End Date: 07/13/2021  This Visit's Progress: On track  Recent Progress: On track  Priority: Medium  Note:   Current barriers:   Patient in need of assistance with connecting to community resources for help in completing ADLs Patient is unable to independently navigate community resource options without care coordination support Mobility issues Memory issues  Clinical Goals:   Patient will communicate with LCSW in next 30 days to discuss ADLs completion of client Patient will communicate will RNCM as needed in next 30 days for nursing support Patient will talk with [name] in next 30 days about in home care support options for client  Clinical Interventions:  Collaboration with [name], [name], MD regarding development and update of comprehensive plan of care as evidenced by provider attestation and co-signature Assessment of needs, barriers of client  Talked with [name] , Significant Other, about RNCM support for client Talked with [name] about CCM program support Talked with [name] about memory issues of client Talked with [name] about client completion of ADLs (she said he does a sponge bath daily and often it may take about 2 hours to complete) Talked with [name] about sleeping issues of client Talked with [name] about fact that client uses a Patent attorney for mobility Talked with [name] previously about  mood of client (she said client has a flat affect) Talked with [name] about client daily needs Encouraged [name] to call [facility] or LCSW as needed for CCM support for client needs Talked with [name] about ADTS in home support services. Provided [place] with phone number for ADTS for her to call. She said she was interested in looking into Adult nurse for client  Patient Coping Skills: Has support from [facility] Attends scheduled medical appointments  Patient Deficits:  Memory issues Mobility issues  Patient Goals: In next 30 days, client will Attend scheduled medical appointments Take medications as prescribed Talk regularly with [name] about client needs -  Follow Up Plan: LCSW to call client  or [name] on 06/05/21 at 2:00 PM to assess client needs     Norva Riffle.Blessings Inglett MSW, LCSW Licensed Clinical Social Worker [facility] Care Management [phone]

## 2021-04-21 NOTE — Patient Instructions (Signed)
Visit Information  PATIENT GOALS:  Goals Addressed             This Visit's Progress    Manage My Emotions; Complete ADLs daily, as able       Timeframe:  Short-Term Goal Priority:  Medium Progress: On Track Start Date:             03/13/21                Expected End Date:           07/13/21            Follow Up Date  06/05/21 at 2:00 PM   Manage Emotions: Complete ADLs daily as able    Why is this important?   When you are stressed, down or upset, your body reacts too.  For example, your blood pressure may get higher; you may have a headache or stomachache.  When your emotions get the best of you, your body's ability to fight off cold and flu gets weak.  These steps will help you manage your emotions.     Patient Coping Skills: Has support from [facility] Attends scheduled medical appointments  Patient Deficits:  Memory issues Mobility issues  Patient Goals: In next 30 days, client will Attend scheduled medical appointments Take medications as prescribed Talk regularly with [name] about client needs -  Follow Up Plan: LCSW to call client  or [name] on  06/05/21 at 2:00 PM to assess client needs           Norva Riffle.Beryle Zeitz MSW, LCSW Licensed Clinical Social Worker [facility] Care Management [phone]

## 2021-04-26 ENCOUNTER — Other Ambulatory Visit: Payer: Self-pay | Admitting: Family Medicine

## 2021-04-26 DIAGNOSIS — R413 Other amnesia: Secondary | ICD-10-CM

## 2021-05-01 DIAGNOSIS — N3945 Continuous leakage: Secondary | ICD-10-CM | POA: Diagnosis not present

## 2021-05-05 DIAGNOSIS — I10 Essential (primary) hypertension: Secondary | ICD-10-CM

## 2021-05-05 DIAGNOSIS — I482 Chronic atrial fibrillation, unspecified: Secondary | ICD-10-CM

## 2021-05-08 ENCOUNTER — Ambulatory Visit (INDEPENDENT_AMBULATORY_CARE_PROVIDER_SITE_OTHER): Payer: PPO | Admitting: Licensed Clinical Social Worker

## 2021-05-08 DIAGNOSIS — S88111A Complete traumatic amputation at level between knee and ankle, right lower leg, initial encounter: Secondary | ICD-10-CM

## 2021-05-08 DIAGNOSIS — I10 Essential (primary) hypertension: Secondary | ICD-10-CM

## 2021-05-08 DIAGNOSIS — R413 Other amnesia: Secondary | ICD-10-CM

## 2021-05-08 DIAGNOSIS — I482 Chronic atrial fibrillation, unspecified: Secondary | ICD-10-CM

## 2021-05-08 DIAGNOSIS — E538 Deficiency of other specified B group vitamins: Secondary | ICD-10-CM

## 2021-05-08 DIAGNOSIS — K219 Gastro-esophageal reflux disease without esophagitis: Secondary | ICD-10-CM

## 2021-05-08 NOTE — Chronic Care Management (AMB) (Signed)
Chronic Care Management    Clinical Social Work Note  05/08/2021 Name: Brett Pearson. MRN: [id number] DOB: 08-25-1940  Brett Pearson. is a 80 y.o. year old male who is a primary care patient of [name], [name], MD. The CCM team was consulted to assist the patient with chronic disease management and/or care coordination needs related to: [name] .   Engaged with patient /significant other, Brett Pearson for follow up visit in response to provider referral for social work chronic care management and care coordination services.   Consent to Services:  The patient was given information about Chronic Care Management services, agreed to services, and gave verbal consent prior to initiation of services.  Please see initial visit note for detailed documentation.   Patient agreed to services and consent obtained.   Assessment: Review of patient past medical history, allergies, medications, and health status, including review of relevant consultants reports was performed today as part of a comprehensive evaluation and provision of chronic care management and care coordination services.     SDOH (Social Determinants of Health) assessments and interventions performed:  SDOH Interventions    Flowsheet Row Most Recent Value  SDOH Interventions   Physical Activity Interventions Other (Comments)  [client has challenges in walking]  Depression Interventions/Treatment  Counseling        Advanced Directives Status: See Vynca application for related entries.  CCM Care Plan  Allergies  Allergen Reactions   Amoxicillin     Other reaction(s): HIVES    Outpatient Encounter Medications as of 05/08/2021  Medication Sig Note   cholecalciferol (VITAMIN D) 1000 UNITS tablet Take 1,000 Units by mouth daily.    Cyanocobalamin (VITAMIN B-12 PO) Take by mouth daily.    donepezil (ARICEPT) 10 MG tablet Take 1 tablet (10 mg total) by mouth at bedtime.    gabapentin  (NEURONTIN) 300 MG capsule Take 3 capsules (900 mg total) by mouth 2 (two) times daily.    Magnesium Gluconate 500 (27 Mg) MG TABS Take 500 mg by mouth. 07/11/2016: Received from: [facility] Received Sig: Take 500 mg by mouth.   metoprolol succinate (TOPROL-XL) 25 MG 24 hr tablet 02/29/16 TAKE 1 TABLET BY MOUTH DAILY    Multiple Vitamin (MULTIVITAMIN) tablet Take 1 tablet by mouth daily.    warfarin (COUMADIN) 2 MG tablet 6 mg on every day except Mondays and Fridays, take 4 mg on Monday and Friday    No facility-administered encounter medications on file as of 05/08/2021.    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   Gastroesophageal reflux disease without esophagitis 03/20/2018   History of colon polyps 03/20/2018   Chronic pain syndrome 01/28/2018   Gait abnormality 10/14/2017   Vitamin D deficiency 10/14/2017   Astigmatism with presbyopia, bilateral 04/16/2017   Dry eye syndrome of both lacrimal glands 03/28/2017   Pseudophakia of both eyes 03/28/2017   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   Bronchiectasis without complication ([place]) 47/04/6282   Interstitial lung disease ([place]) 02/08/2017  Abnormal CT of the chest 01/30/2017   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   Memory loss 08/03/2016   Vitamin B12 deficiency 02/13/2016   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    Essential hypertension 07/13/2015   Obesity (BMI 30-39.9) 07/13/2015   Chronic atrial fibrillation ([place]) 11/06/2012   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[place]) 08/09/2011   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/2011    Conditions to be addressed/monitored: monitor client completion of ADLs  Care Plan : [place]  Updates made by Brett Cabal, LCSW since 05/08/2021 12:00 AM     Problem: Coping Skills (General Plan of Care)      Goal: Coping Skills Enhanced: Complete ADLs daily, as able   Start Date: 03/13/2021  Expected End Date: 07/13/2021  This Visit's Progress: On track  Recent Progress: On track  Priority: High  Note:   Current barriers:   Patient in need of assistance with connecting to community resources for help in completing ADLs Patient is unable to independently navigate community resource options without care coordination support Mobility issues Memory issues  Clinical Goals:   Patient will communicate with LCSW in next 30 days to discuss ADLs completion of client Patient will communicate will RNCM as needed in next 30 days for nursing support Patient will talk with Brett Pearson in next 30 days about in home care support options for client  Clinical Interventions:  Collaboration with [name], [name], MD regarding development and update of comprehensive plan of care as evidenced by provider attestation and co-signature Assessment of needs, barriers of client  Discussed with Brett Pearson , Significant Other, current needs of client Discussed with Brett Pearson the incontinency issues of client at present Reviewed with Brett Pearson client appointments.  She said client has appointment tomorrow at [facility] with Brett Limes FNP to discuss client needs at present Brett Pearson related to memory challenges of client. She is concerned that client is having worsening memory issues. Brett Pearson talked with LCSW today about [facility] in [place], [place].  LCSW and  Brett Pearson spoke of Alzheimer's/Dementa care unit at that facility.  LCSW gave Brett Pearson the phone number for that facility Reviewed with [facility] support for client. LCSW recommended that Maribel call [facility] facility in [place] and talk with [place] Worker at that facility about care options for client through the [place]. Provided counseling support for [facility] related to managing care needs of client Encouraged Brett Pearson to call RNCM or LCSW as needed for CCM support for client needs  Patient Coping Skills: Has support from [facility] Attends scheduled medical appointments  Patient Deficits:  Memory issues Mobility issues  Patient Goals: In next 30 days, client will Attend scheduled medical appointments Take medications as prescribed Talk regularly with Brett Pearson about client needs -  Follow Up Plan: LCSW to call client  or Brett Pearson on 06/05/21 at 2:00 PM to assess client needs      Norva Riffle.Endora Teresi MSW, LCSW Licensed Clinical Social Worker [facility] Care Management [phone]

## 2021-05-08 NOTE — Patient Instructions (Signed)
Visit Information  PATIENT GOALS:  Goals Addressed             This Visit's Progress    Manage My Emotions; Complete ADLs daily, as able       Timeframe:  Short-Term Goal Priority:  High  Progress: On Track Start Date:             03/13/21                Expected End Date:           07/13/21            Follow Up Date  06/05/21 at 2:00 PM   Manage Emotions: Complete ADLs daily as able    Why is this important?   When you are stressed, down or upset, your body reacts too.  For example, your blood pressure may get higher; you may have a headache or stomachache.  When your emotions get the best of you, your body's ability to fight off cold and flu gets weak.  These steps will help you manage your emotions.     Patient Coping Skills: Has support from [facility] Attends scheduled medical appointments  Patient Deficits:  Memory issues Mobility issues  Patient Goals: In next 30 days, client will Attend scheduled medical appointments Take medications as prescribed Talk regularly with [name] about client needs -  Follow Up Plan: LCSW to call client  or [name] on  06/05/21 at 2:00 PM to assess client needs     Norva Riffle.Malaika Arnall MSW, LCSW Licensed Clinical Social Worker [facility] Care Management [phone]

## 2021-05-09 ENCOUNTER — Other Ambulatory Visit: Payer: Self-pay

## 2021-05-09 ENCOUNTER — Ambulatory Visit (INDEPENDENT_AMBULATORY_CARE_PROVIDER_SITE_OTHER): Payer: PPO | Admitting: Family Medicine

## 2021-05-09 ENCOUNTER — Encounter: Payer: Self-pay | Admitting: Family Medicine

## 2021-05-09 ENCOUNTER — Telehealth: Payer: Self-pay | Admitting: Family Medicine

## 2021-05-09 VITALS — BP 131/71 | HR 67 | Temp 96.9°F | Ht 72.0 in | Wt 200.0 lb

## 2021-05-09 DIAGNOSIS — R413 Other amnesia: Secondary | ICD-10-CM

## 2021-05-09 DIAGNOSIS — R3981 Functional urinary incontinence: Secondary | ICD-10-CM

## 2021-05-09 DIAGNOSIS — Z87440 Personal history of urinary (tract) infections: Secondary | ICD-10-CM

## 2021-05-09 DIAGNOSIS — L989 Disorder of the skin and subcutaneous tissue, unspecified: Secondary | ICD-10-CM

## 2021-05-09 DIAGNOSIS — Z636 Dependent relative needing care at home: Secondary | ICD-10-CM | POA: Diagnosis not present

## 2021-05-09 NOTE — Progress Notes (Signed)
Assessment & Plan:  1. Skin lesion of face Reassurance provided.  2-3. 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 Memory loss/Caregiver stress - Discussed calling various locations for placement. Offered referral to Education officer, museum, but significant other declined. Information provided on home health services.    Follow up plan: Return if symptoms worsen or fail to improve.  Hendricks Limes, MSN, APRN, FNP-C [facility]  Subjective:   Patient ID: Brett Pearson., male    DOB: 1941/08/01, 80 y.o.   MRN: [id number]  HPI: Brett Pearson. is a 79 y.o. male presenting on 05/09/2021 for Bump on face  Patient is accompanied by his significant other, who is his primary caregiver.   Patient has a bump on his left cheek that he noticed over the weekend. States he popped it last night. They would just like it looked at.  Additionally, patient's significant other is requesting a referral to urology for an indwelling urinary catheter. Patient is currently wearing a condom catheter, which she reports only stays on 50% of the time. Patient refuses to wear it during the day. She has a hard time changing him during the day because he refuses. He does have a history of urinary tract infections. No skin breakdown. Patient sleeps in pants and with four blankets at night, so when the catheter fails, there is a lot of laundry to be done. Patient has memory loss and is apparently on a waiting list at multiple memory care units for placement.    ROS: Negative unless specifically indicated above in HPI.   Relevant past medical history reviewed and updated as indicated.   Allergies and medications reviewed and updated.   Current Outpatient Medications:    cholecalciferol (VITAMIN D) 1000 UNITS tablet, Take 1,000 Units by mouth daily., Disp: , Rfl:    Cyanocobalamin (VITAMIN B-12 PO), Take by mouth daily., Disp:  , Rfl:    donepezil (ARICEPT) 10 MG tablet, Take 1 tablet (10 mg total) by mouth at bedtime., Disp: 90 tablet, Rfl: 1   gabapentin (NEURONTIN) 300 MG capsule, Take 3 capsules (900 mg total) by mouth 2 (two) times daily., Disp: 180 capsule, Rfl: 3   Magnesium Gluconate 500 (27 Mg) MG TABS, Take 500 mg by mouth., Disp: , Rfl:    metoprolol succinate (TOPROL-XL) 25 MG 24 hr tablet, 02/29/16 TAKE 1 TABLET BY MOUTH DAILY, Disp: 90 tablet, Rfl: 0   Multiple Vitamin (MULTIVITAMIN) tablet, Take 1 tablet by mouth daily., Disp: , Rfl:    warfarin (COUMADIN) 2 MG tablet, 6 mg on every day except Mondays and Fridays, take 4 mg on Monday and Friday, Disp: 200 tablet, Rfl: 1  Allergies  Allergen Reactions   Amoxicillin     Other reaction(s): HIVES    Objective:   BP 131/71   Pulse 67   Temp (!) 96.9 F (36.1 C) (Temporal)   Ht 6' (1.829 m)   Wt 200 lb (90.7 kg)   SpO2 96%   BMI 27.12 kg/m    Physical Exam Vitals reviewed.  Constitutional:      General: He is not in acute distress.    Appearance: Normal appearance. He is not ill-appearing, toxic-appearing or diaphoretic.  HENT:     Head: Normocephalic and atraumatic.  Eyes:     General: No scleral icterus.       Right eye: No discharge.        Left eye: No  discharge.     Conjunctiva/sclera: Conjunctivae normal.  Cardiovascular:     Rate and Rhythm: Normal rate.  Pulmonary:     Effort: Pulmonary effort is normal. No respiratory distress.  Musculoskeletal:        General: Normal range of motion.     Cervical back: Normal range of motion.  Skin:    General: Skin is warm and dry.     Findings: Lesion (left cheek without erythema, warmth or drainage. Looks like patient popped a bump.) present.  Neurological:     Mental Status: He is alert and oriented to person, place, and time. Mental status is at baseline.  Psychiatric:        Mood and Affect: Mood normal.        Behavior: Behavior normal.        Thought Content: Thought content  normal.        Judgment: Judgment normal.

## 2021-05-09 NOTE — Patient Instructions (Signed)
[employer] Phone: [phone]

## 2021-05-09 NOTE — Telephone Encounter (Signed)
Brett Pearson just wanted to let you know that he is on the waiting list at 2 places for memory care.

## 2021-05-10 NOTE — Telephone Encounter (Signed)
Okay thanks for the FYI 

## 2021-05-15 ENCOUNTER — Other Ambulatory Visit: Payer: Self-pay

## 2021-05-15 ENCOUNTER — Ambulatory Visit (INDEPENDENT_AMBULATORY_CARE_PROVIDER_SITE_OTHER): Payer: PPO

## 2021-05-15 ENCOUNTER — Encounter: Payer: Self-pay | Admitting: Family Medicine

## 2021-05-15 DIAGNOSIS — Z23 Encounter for immunization: Secondary | ICD-10-CM

## 2021-05-26 ENCOUNTER — Ambulatory Visit: Payer: PPO | Admitting: Family Medicine

## 2021-05-29 DIAGNOSIS — Z89511 Acquired absence of right leg below knee: Secondary | ICD-10-CM | POA: Diagnosis not present

## 2021-05-30 ENCOUNTER — Other Ambulatory Visit: Payer: Self-pay

## 2021-05-30 DIAGNOSIS — I482 Chronic atrial fibrillation, unspecified: Secondary | ICD-10-CM

## 2021-05-30 DIAGNOSIS — Z87440 Personal history of urinary (tract) infections: Secondary | ICD-10-CM

## 2021-05-31 ENCOUNTER — Ambulatory Visit (INDEPENDENT_AMBULATORY_CARE_PROVIDER_SITE_OTHER): Payer: PPO | Admitting: Family Medicine

## 2021-05-31 ENCOUNTER — Other Ambulatory Visit: Payer: Self-pay

## 2021-05-31 ENCOUNTER — Encounter: Payer: Self-pay | Admitting: Family Medicine

## 2021-05-31 VITALS — BP 122/70 | HR 65 | Ht 72.0 in | Wt 204.0 lb

## 2021-05-31 DIAGNOSIS — N390 Urinary tract infection, site not specified: Secondary | ICD-10-CM

## 2021-05-31 DIAGNOSIS — I482 Chronic atrial fibrillation, unspecified: Secondary | ICD-10-CM | POA: Diagnosis not present

## 2021-05-31 DIAGNOSIS — Z7901 Long term (current) use of anticoagulants: Secondary | ICD-10-CM | POA: Diagnosis not present

## 2021-05-31 DIAGNOSIS — H6123 Impacted cerumen, bilateral: Secondary | ICD-10-CM | POA: Diagnosis not present

## 2021-05-31 LAB — URINALYSIS, COMPLETE
Bilirubin, UA: NEGATIVE
Glucose, UA: NEGATIVE
Nitrite, UA: POSITIVE — AB
Protein,UA: NEGATIVE
Specific Gravity, UA: 1.02 (ref 1.005–1.030)
Urobilinogen, Ur: 0.2 mg/dL (ref 0.2–1.0)
pH, UA: 5.5 (ref 5.0–7.5)

## 2021-05-31 LAB — MICROSCOPIC EXAMINATION
Renal Epithel, UA: NONE SEEN /hpf
WBC, UA: >30 /hpf — AB (ref 0–5)

## 2021-05-31 LAB — COAGUCHEK XS/INR WAIVED
INR: 3.1 — ABNORMAL HIGH (ref 0.9–1.1)
Prothrombin Time: 37.2 s

## 2021-05-31 MED ORDER — METOPROLOL SUCCINATE ER 25 MG PO TB24: 25.0000 mg | ORAL_TABLET | Freq: Every day | ORAL | 3 refills | Status: DC

## 2021-05-31 MED ORDER — WARFARIN SODIUM 2 MG PO TABS: ORAL_TABLET | ORAL | 1 refills | Status: DC

## 2021-05-31 NOTE — Progress Notes (Signed)
BP 122/70   Pulse 65   Ht 6' (1.829 m)   Wt 204 lb (92.5 kg)   SpO2 96%   BMI 27.67 kg/m    Subjective:   Patient ID: Brett Pearson., male    DOB: [birth year], 80 y.o.   MRN: [id number]  HPI: Mukhtar Shams. is a 80 y.o. male presenting on 05/31/2021 for Medical Management of Chronic Issues, Atrial Fibrillation, and loss of hearing (Hearing getting worse per spouse)   HPI Coumadin recheck Target goal: 2.0-3.0 Reason on anticoagulation: Chronic A. fib Patient denies any bruising or bleeding or chest pain or palpitations   Patient's wife wants him to leave urine because he has frequent UTIs.  Relevant past medical, surgical, family and social history reviewed and updated as indicated. Interim medical history since our last visit reviewed. Allergies and medications reviewed and updated.  Review of Systems  Constitutional:  Negative for chills and fever.  Respiratory:  Negative for shortness of breath and wheezing.   Cardiovascular:  Negative for chest pain and leg swelling.  Musculoskeletal:  Negative for back pain and gait problem.  Skin:  Negative for rash.  All other systems reviewed and are negative.  Per HPI unless specifically indicated above   Allergies as of 05/31/2021       Reactions   Amoxicillin    Other reaction(s): HIVES        Medication List        Accurate as of May 31, 2021  4:20 PM. If you have any questions, ask your nurse or doctor.          cholecalciferol 1000 units tablet Commonly known as: VITAMIN D Take 1,000 Units by mouth daily.   donepezil 10 MG tablet Commonly known as: ARICEPT Take 1 tablet (10 mg total) by mouth at bedtime.   gabapentin 300 MG capsule Commonly known as: NEURONTIN Take 3 capsules (900 mg total) by mouth 2 (two) times daily.   Magnesium Gluconate 500 (27 Mg) MG Tabs Take 500 mg by mouth.   metoprolol succinate 25 MG 24 hr tablet Commonly known as: TOPROL-XL Take 1 tablet (25 mg total) by  mouth daily. What changed:  how much to take how to take this when to take this additional instructions Changed by: [name] Zenas Santa, MD   multivitamin tablet Take 1 tablet by mouth daily.   VITAMIN B-12 PO Take by mouth daily.   warfarin 2 MG tablet Commonly known as: COUMADIN Take as directed by the anticoagulation clinic. If you are unsure how to take this medication, talk to your nurse or doctor. Original instructions: 6 mg on every day except Mondays and Fridays, take 4 mg on Monday and Friday         Objective:   BP 122/70   Pulse 65   Ht 6' (1.829 m)   Wt 204 lb (92.5 kg)   SpO2 96%   BMI 27.67 kg/m   Wt Readings from Last 3 Encounters:  05/31/21 204 lb (92.5 kg)  05/09/21 200 lb (90.7 kg)  04/07/21 202 lb (91.6 kg)    Physical Exam Vitals and nursing note reviewed.  Constitutional:      General: He is not in acute distress.    Appearance: He is well-developed. He is not diaphoretic.  HENT:     Right Ear: There is impacted cerumen.     Left Ear: There is impacted cerumen.  Eyes:     General: No scleral icterus.  Conjunctiva/sclera: Conjunctivae normal.  Neck:     Thyroid: No thyromegaly.  Cardiovascular:     Rate and Rhythm: Normal rate. Rhythm irregular.     Heart sounds: Normal heart sounds. No murmur heard. Pulmonary:     Effort: Pulmonary effort is normal. No respiratory distress.     Breath sounds: Normal breath sounds. No wheezing.  Musculoskeletal:        General: Normal range of motion.     Cervical back: Neck supple.  Lymphadenopathy:     Cervical: No cervical adenopathy.  Skin:    General: Skin is warm and dry.     Findings: No rash.  Neurological:     Mental Status: He is alert and oriented to person, place, and time.     Coordination: Coordination normal.  Psychiatric:        Behavior: Behavior normal.    Description   Hold today and then continue taking 6 mg on every day except Mondays and Fridays, take 4 mg on Monday  and Friday  INR 3.1 (goal 2.0-3.0) Follow-up in 4 to 6 weeks    Nurse to lavage cerumen, patient tolerated well  Assessment & Plan:   Problem List Items Addressed This Visit       Cardiovascular and Mediastinum   Chronic atrial fibrillation (HCC) - Primary   Relevant Medications   warfarin (COUMADIN) 2 MG tablet   metoprolol succinate (TOPROL-XL) 25 MG 24 hr tablet   Other Visit Diagnoses     Chronic anticoagulation       Relevant Medications   warfarin (COUMADIN) 2 MG tablet   Recurrent UTI       Relevant Orders   Urinalysis, Complete   Urine Culture   Bilateral impacted cerumen           Will await results of urinalysis.  See above for Coumadin dosing. Follow up plan: Return if symptoms worsen or fail to improve, for 4 to 6-week INR recheck.  Counseling provided for all of the vaccine components Orders Placed This Encounter  Procedures   Urine Culture   Urinalysis, Complete     Caryl Pina, MD [facility] 05/31/2021, 4:20 PM

## 2021-06-01 DIAGNOSIS — N3945 Continuous leakage: Secondary | ICD-10-CM | POA: Diagnosis not present

## 2021-06-01 DIAGNOSIS — L03116 Cellulitis of left lower limb: Secondary | ICD-10-CM | POA: Diagnosis not present

## 2021-06-01 DIAGNOSIS — I872 Venous insufficiency (chronic) (peripheral): Secondary | ICD-10-CM | POA: Diagnosis not present

## 2021-06-01 DIAGNOSIS — S81802A Unspecified open wound, left lower leg, initial encounter: Secondary | ICD-10-CM | POA: Diagnosis not present

## 2021-06-02 ENCOUNTER — Other Ambulatory Visit: Payer: Self-pay

## 2021-06-02 ENCOUNTER — Encounter: Payer: Self-pay | Admitting: Family Medicine

## 2021-06-02 ENCOUNTER — Ambulatory Visit (INDEPENDENT_AMBULATORY_CARE_PROVIDER_SITE_OTHER): Payer: PPO | Admitting: Family Medicine

## 2021-06-02 VITALS — BP 115/61 | HR 58 | Ht 73.0 in | Wt 203.0 lb

## 2021-06-02 DIAGNOSIS — S81812A Laceration without foreign body, left lower leg, initial encounter: Secondary | ICD-10-CM

## 2021-06-02 NOTE — Progress Notes (Signed)
BP 115/61   Pulse (!) 58   Ht 6\' 1"  (1.854 m)   Wt 203 lb (92.1 kg)   SpO2 98%   BMI 26.78 kg/m    Subjective:   Patient ID: Brett Duffel., male    DOB: [birth year], 80 y.o.   MRN: [id number]  HPI: Brett Corvino. is a 80 y.o. male presenting on 06/02/2021 for wound (LLE, noticed 2 days ago)   HPI Wound on left shin Patient has noticed a wound on his left shin or his wife noticed that over the past couple days.  It looks like an abrasion that is superficial on anterior left lower extremity and she is very worried about it because he gets these really bad infections and has taken long times to heal in the past.  She denies any redness or warmth or fevers or chills currently.  Relevant past medical, surgical, family and social history reviewed and updated as indicated. Interim medical history since our last visit reviewed. Allergies and medications reviewed and updated.  Review of Systems  Constitutional:  Negative for chills and fever.  Respiratory:  Negative for shortness of breath and wheezing.   Cardiovascular:  Negative for chest pain and leg swelling.  Genitourinary:  Negative for difficulty urinating and frequency.  Musculoskeletal:  Negative for back pain and gait problem.  Skin:  Positive for wound. Negative for rash.  All other systems reviewed and are negative.  Per HPI unless specifically indicated above   Allergies as of 06/02/2021       Reactions   Amoxicillin    Other reaction(s): HIVES        Medication List        Accurate as of June 02, 2021  3:07 PM. If you have any questions, ask your nurse or doctor.          cholecalciferol 1000 units tablet Commonly known as: VITAMIN D Take 1,000 Units by mouth daily.   donepezil 10 MG tablet Commonly known as: ARICEPT Take 1 tablet (10 mg total) by mouth at bedtime.   gabapentin 300 MG capsule Commonly known as: NEURONTIN Take 3 capsules (900 mg total) by mouth 2 (two) times daily.    Magnesium Gluconate 500 (27 Mg) MG Tabs Take 500 mg by mouth.   metoprolol succinate 25 MG 24 hr tablet Commonly known as: TOPROL-XL Take 1 tablet (25 mg total) by mouth daily.   multivitamin tablet Take 1 tablet by mouth daily.   VITAMIN B-12 PO Take by mouth daily.   warfarin 2 MG tablet Commonly known as: COUMADIN Take as directed by the anticoagulation clinic. If you are unsure how to take this medication, talk to your nurse or doctor. Original instructions: 6 mg on every day except Mondays and Fridays, take 4 mg on Monday and Friday         Objective:   BP 115/61   Pulse (!) 58   Ht 6\' 1"  (1.854 m)   Wt 203 lb (92.1 kg)   SpO2 98%   BMI 26.78 kg/m   Wt Readings from Last 3 Encounters:  06/02/21 203 lb (92.1 kg)  05/31/21 204 lb (92.5 kg)  05/09/21 200 lb (90.7 kg)    Physical Exam Vitals and nursing note reviewed.  Constitutional:      Appearance: Normal appearance.  Skin:    General: Skin is warm.  Neurological:     Mental Status: He is alert.   Superficial laceration of left lower extremity:  Abrasion, likely due to his edema, has 4 spots of skin tear all parallel to each other on the shin.  About mid calf.  Nurse to place [employer], return in 1 week   Assessment & Plan:   Problem List Items Addressed This Visit   None Visit Diagnoses     Laceration of left lower extremity, initial encounter    -  Primary       Patient instructed to keep Unna boot on and return in 1 week. Follow up plan: Return if symptoms worsen or fail to improve.  Counseling provided for all of the vaccine components No orders of the defined types were placed in this encounter.   Caryl Pina, MD [facility] 06/02/2021, 3:07 PM

## 2021-06-05 ENCOUNTER — Ambulatory Visit: Payer: PPO | Admitting: Licensed Clinical Social Worker

## 2021-06-05 DIAGNOSIS — E538 Deficiency of other specified B group vitamins: Secondary | ICD-10-CM

## 2021-06-05 DIAGNOSIS — I482 Chronic atrial fibrillation, unspecified: Secondary | ICD-10-CM | POA: Diagnosis not present

## 2021-06-05 DIAGNOSIS — I10 Essential (primary) hypertension: Secondary | ICD-10-CM

## 2021-06-05 DIAGNOSIS — S88111A Complete traumatic amputation at level between knee and ankle, right lower leg, initial encounter: Secondary | ICD-10-CM

## 2021-06-05 DIAGNOSIS — R413 Other amnesia: Secondary | ICD-10-CM

## 2021-06-05 DIAGNOSIS — K219 Gastro-esophageal reflux disease without esophagitis: Secondary | ICD-10-CM

## 2021-06-05 LAB — URINE CULTURE

## 2021-06-05 NOTE — Chronic Care Management (AMB) (Signed)
Chronic Care Management    Clinical Social Work Note  06/05/2021 Name: Brett Pearson. MRN: [id number] DOB: Aug 26, 1940  Brett Pearson. is a 80 y.o. year old male who is a primary care patient of Brett Pearson, Brett Kaufmann, MD. The CCM team was consulted to assist the patient with chronic disease management and/or care coordination needs related to: [name] .   Engaged with patient / Brett Pearson, Significant other, by telephone for follow up visit in response to provider referral for social work chronic care management and care coordination services.   Consent to Services:  The patient was given information about Chronic Care Management services, agreed to services, and gave verbal consent prior to initiation of services.  Please see initial visit note for detailed documentation.   Patient agreed to services and consent obtained.   Assessment: Review of patient past medical history, allergies, medications, and health status, including review of relevant consultants reports was performed today as part of a comprehensive evaluation and provision of chronic care management and care coordination services.     SDOH (Social Determinants of Health) assessments and interventions performed:  SDOH Interventions    Flowsheet Row Most Recent Value  SDOH Interventions   Physical Activity Interventions Other (Comments)  [walking challenges]  Stress Interventions Provide Counseling  [client has stress related to memory issues,  client has stress related to managing medical needs]  Transportation Interventions Other (Comment)  [client is dependent on caregivers for transportation assistance]  Depression Interventions/Treatment  Counseling        Advanced Directives Status: See Vyn[employer] application for related entries.  CCM Care Plan  Allergies  Allergen Reactions   Amoxicillin     Other reaction(s): HIVES    Outpatient Encounter Medications as of 06/05/2021  Medication Sig Note    cholecalciferol (VITAMIN D) 1000 UNITS tablet Take 1,000 Units by mouth daily.    Cyanocobalamin (VITAMIN B-12 PO) Take by mouth daily.    donepezil (ARICEPT) 10 MG tablet Take 1 tablet (10 mg total) by mouth at bedtime.    gabapentin (NEURONTIN) 300 MG capsule Take 3 capsules (900 mg total) by mouth 2 (two) times daily.    Magnesium Gluconate 500 (27 Mg) MG TABS Take 500 mg by mouth. 07/11/2016: Received from: [facility] Received Sig: Take 500 mg by mouth.   metoprolol succinate (TOPROL-XL) 25 MG 24 hr tablet Take 1 tablet (25 mg total) by mouth daily.    Multiple Vitamin (MULTIVITAMIN) tablet Take 1 tablet by mouth daily.    warfarin (COUMADIN) 2 MG tablet 6 mg on every day except Mondays and Fridays, take 4 mg on Monday and Friday    No facility-administered encounter medications on file as of 06/05/2021.    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   Gastroesophageal reflux disease without esophagitis 03/20/2018   History of colon polyps 03/20/2018   Chronic pain syndrome 01/28/2018   Gait abnormality 10/14/2017   Vitamin D deficiency 10/14/2017   Astigmatism with presbyopia, bilateral 04/16/2017   Dry eye  syndrome of both lacrimal glands 03/28/2017   Pseudophakia of both eyes 03/28/2017   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   Bronchiectasis without complication ([place]) 64/15/8309   Interstitial lung disease ([place])  02/08/2017   Abnormal CT of the chest 01/30/2017   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   Memory loss 08/03/2016   Vitamin B12 deficiency 02/13/2016   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   Essential hypertension 07/13/2015   Obesity (BMI 30-39.9) 07/13/2015   Chronic atrial fibrillation ([place]) 11/06/2012   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    Conditions to be addressed/monitored: monitor client completion of ADLs as he is able  Care Plan : [name]  Updates made by Brett Cabal, LCSW since 06/05/2021 12:00 AM     Problem: Coping Skills (General Plan of Care)      Goal: Coping Skills Enhanced: Complete ADLs daily, as able   Start Date: 06/05/2021  Expected End Date: 09/01/2021  This Visit's Progress: Not on track  Recent Progress: On track  Priority: High  Note:   Current barriers:   Patient in need of assistance with connecting to community resources for help in completing ADLs Patient is unable to independently navigate community resource options without care coordination support Mobility issues Memory issues  Clinical Goals:   Patient will communicate with LCSW in next 30 days to discuss ADLs completion of client Patient will communicate will RNCM as needed in next 30 days for nursing support Patient will cooperate with in home care support staff helping him weekly in the home as scheduled  Clinical Interventions:  Collaboration with Brett Pearson, Brett Kaufmann, MD regarding development and update of comprehensive plan of care as evidenced by provider attestation and co-signature Assessment of needs, barriers of client  Discussed with Brett Pearson , Significant Other, current needs of client Discussed with Brett Pearson the incontinency issues of client at present Brett Pearson related to memory challenges of client. She is concerned that  client is having worsening memory issues.Brett Pearson said that client is on the waiting list at [facility] and is on waiting list at [facility] . Reviewed with [facility] support for client. Brett Pearson said she has applied for [facility] in home support for client and is waiting on reply from [place]. Provided counseling support for [facility] related to managing care needs of client Encouraged Brett Pearson to call RNCM or LCSW as needed for CCM support for client needs Discussed ADLs completion of client. Brett Pearson said client has to have help from caregiver in taking a bath or getting dressed Discussed with [facility] wound care for client.  She said client has history of infections. Reviewed with Brett Pearson upcoming medical appointments for client  Patient Coping Skills: Has support from [facility] Attends scheduled medical appointments  Patient Deficits:  Memory issues Mobility issues  Patient Goals: In next 30 days, client will Attend scheduled medical appointments Take medications as prescribed Talk regularly with Brett Pearson about client needs -  Follow Up Plan: LCSW to call client  or Brett Pearson on 08/01/21 at 4:00 PM to assess client needs      Norva Riffle.Afia Messenger MSW, LCSW Licensed Clinical Social Worker [facility] Care Management [phone]

## 2021-06-05 NOTE — Patient Instructions (Addendum)
Visit Information  Patient Goal:  Manage Emotions; Complete ADLs as he is able  Timeframe:  Short-Term Goal Priority:  High  Progress: Not On Track Start Date:            06/05/21                Expected End Date:          09/01/21            Follow Up Date  08/01/21 at 4:00 PM   Manage Emotions: Complete ADLs daily as able    Why is this important?   When you are stressed, down or upset, your body reacts too.  For example, your blood pressure may get higher; you may have a headache or stomachache.  When your emotions get the best of you, your body's ability to fight off cold and flu gets weak.  These steps will help you manage your emotions.     Patient Coping Skills: Has support from [facility] Attends scheduled medical appointments  Patient Deficits:  Memory issues Mobility issues  Patient Goals: In next 30 days, client will Attend scheduled medical appointments Take medications as prescribed Talk regularly with [name] about client needs -  Follow Up Plan: LCSW to call client  or [name] on  08/01/21 at 4:00 PM to assess client needs   Norva Riffle.Stacey Sago MSW, LCSW Licensed Clinical Social Worker [facility] Care Management [phone]

## 2021-06-09 ENCOUNTER — Ambulatory Visit: Payer: PPO | Admitting: Family Medicine

## 2021-06-09 ENCOUNTER — Encounter: Payer: Self-pay | Admitting: Family Medicine

## 2021-06-09 ENCOUNTER — Other Ambulatory Visit: Payer: Self-pay

## 2021-06-09 ENCOUNTER — Ambulatory Visit (INDEPENDENT_AMBULATORY_CARE_PROVIDER_SITE_OTHER): Payer: PPO | Admitting: Family Medicine

## 2021-06-09 VITALS — BP 107/62 | HR 61

## 2021-06-09 DIAGNOSIS — S81812D Laceration without foreign body, left lower leg, subsequent encounter: Secondary | ICD-10-CM

## 2021-06-09 NOTE — Progress Notes (Signed)
.  BP 107/62   Pulse 61   SpO2 95%    Subjective:   Patient ID: Brett Duffel., male    DOB: August 10, 1940, 80 y.o.   MRN: [id number]  HPI: Brett Smoak. is a 80 y.o. male presenting on 06/09/2021 for unna boot (Dressing change today. Continues to take Doxy. Wife thinks may be causing memory changes and loose stools)   HPI Left lower extremity wound recheck Patient has been wearing a boot for a week still like is doing much better.  Brett Pearson did wear a week and did not take it off and is coming in for the recheck today.  Brett Pearson denies any fevers or chills or redness or warmth.  Relevant past medical, surgical, family and social history reviewed and updated as indicated. Interim medical history since our last visit reviewed. Allergies and medications reviewed and updated.  Review of Systems  Constitutional:  Negative for chills and fever.  Respiratory:  Negative for shortness of breath and wheezing.   Cardiovascular:  Negative for chest pain and leg swelling.  Skin:  Positive for wound. Negative for color change and rash.  All other systems reviewed and are negative.  Per HPI unless specifically indicated above   Allergies as of 06/09/2021       Reactions   Amoxicillin    Other reaction(s): HIVES        Medication List        Accurate as of June 09, 2021  4:04 PM. If you have any questions, ask your nurse or doctor.          cholecalciferol 1000 units tablet Commonly known as: VITAMIN D Take 1,000 Units by mouth daily.   donepezil 10 MG tablet Commonly known as: ARICEPT Take 1 tablet (10 mg total) by mouth at bedtime.   gabapentin 300 MG capsule Commonly known as: NEURONTIN Take 3 capsules (900 mg total) by mouth 2 (two) times daily.   Magnesium Gluconate 500 (27 Mg) MG Tabs Take 500 mg by mouth.   metoprolol succinate 25 MG 24 hr tablet Commonly known as: TOPROL-XL Take 1 tablet (25 mg total) by mouth daily.   multivitamin tablet Take 1 tablet by mouth  daily.   VITAMIN B-12 PO Take by mouth daily.   warfarin 2 MG tablet Commonly known as: COUMADIN Take as directed by the anticoagulation clinic. If you are unsure how to take this medication, talk to your nurse or doctor. Original instructions: 6 mg on every day except Mondays and Fridays, take 4 mg on Monday and Friday         Objective:   BP 107/62   Pulse 61   SpO2 95%   Wt Readings from Last 3 Encounters:  06/02/21 203 lb (92.1 kg)  05/31/21 204 lb (92.5 kg)  05/09/21 200 lb (90.7 kg)    Physical Exam Vitals and nursing note reviewed.  Constitutional:      General: Brett Pearson is not in acute distress.    Appearance: Brett Pearson is well-developed. Brett Pearson is not diaphoretic.  Eyes:     General: No scleral icterus.    Conjunctiva/sclera: Conjunctivae normal.  Musculoskeletal:        General: Swelling (Trace swelling.  Much improved) present.  Skin:    General: Skin is warm and dry.  Neurological:     Mental Status: Brett Pearson is oriented to person, place, and time.     Coordination: Coordination normal.  Psychiatric:        Behavior:  Behavior normal.    Nurse to apply Unna boot  Assessment & Plan:   Problem List Items Addressed This Visit   None Visit Diagnoses     Laceration of left lower extremity, subsequent encounter    -  Primary       Looking a lot better, will do 1 more week of Unna boot likely be done for Follow up plan: Return in about 1 week (around 06/16/2021), or if symptoms worsen or fail to improve, for Leg wound recheck and Unna boot.  Counseling provided for all of the vaccine components No orders of the defined types were placed in this encounter.   Caryl Pina, MD [facility] 06/09/2021, 4:04 PM

## 2021-06-14 ENCOUNTER — Ambulatory Visit (INDEPENDENT_AMBULATORY_CARE_PROVIDER_SITE_OTHER): Payer: PPO | Admitting: Family Medicine

## 2021-06-14 ENCOUNTER — Other Ambulatory Visit: Payer: Self-pay

## 2021-06-14 ENCOUNTER — Encounter: Payer: Self-pay | Admitting: Family Medicine

## 2021-06-14 VITALS — BP 66/53 | HR 61 | Ht 73.0 in

## 2021-06-14 DIAGNOSIS — Z89511 Acquired absence of right leg below knee: Secondary | ICD-10-CM

## 2021-06-14 DIAGNOSIS — G301 Alzheimer's disease with late onset: Secondary | ICD-10-CM

## 2021-06-14 DIAGNOSIS — I482 Chronic atrial fibrillation, unspecified: Secondary | ICD-10-CM

## 2021-06-14 DIAGNOSIS — N39498 Other specified urinary incontinence: Secondary | ICD-10-CM

## 2021-06-14 DIAGNOSIS — G14 Postpolio syndrome: Secondary | ICD-10-CM

## 2021-06-14 DIAGNOSIS — F02C3 Dementia in other diseases classified elsewhere, severe, with mood disturbance: Secondary | ICD-10-CM

## 2021-06-14 DIAGNOSIS — S81812D Laceration without foreign body, left lower leg, subsequent encounter: Secondary | ICD-10-CM | POA: Diagnosis not present

## 2021-06-14 NOTE — Progress Notes (Signed)
BP (!) 66/53   Pulse 61   Ht 6\' 1"  (1.854 m)   SpO2 95%   BMI 26.78 kg/m    Subjective:   Patient ID: Brett Duffel., male    DOB: October 05, 1940, 80 y.o.   MRN: [id number]  HPI: Brett Calk. is a 80 y.o. male presenting on 06/14/2021 for unna boot   HPI Patient has been struggling with the more because he will not listen to help or commands and is getting more more difficult to get in and out of the bed because of his weakness and having more trouble bathing him and cleaning in the evening when the health comes by couple times a week.  He often refuses that his memory has been continually worsening.  Patient is coming in for wound recheck and they feel it is doing very well.  He did wear the Unna boot all week.  Relevant past medical, surgical, family and social history reviewed and updated as indicated. Interim medical history since our last visit reviewed. Allergies and medications reviewed and updated.  Review of Systems  Constitutional:  Negative for chills and fever.  Respiratory:  Negative for shortness of breath and wheezing.   Cardiovascular:  Negative for chest pain and leg swelling.  Musculoskeletal:  Negative for arthralgias, back pain and gait problem.  Skin:  Negative for rash.  Psychiatric/Behavioral:  Positive for behavioral problems, confusion and decreased concentration. Negative for self-injury, sleep disturbance and suicidal ideas.   All other systems reviewed and are negative.  Per HPI unless specifically indicated above   Allergies as of 06/14/2021       Reactions   Amoxicillin    Other reaction(s): HIVES        Medication List        Accurate as of June 14, 2021  4:22 PM. If you have any questions, ask your nurse or doctor.          cholecalciferol 1000 units tablet Commonly known as: VITAMIN D Take 1,000 Units by mouth daily.   donepezil 10 MG tablet Commonly known as: ARICEPT Take 1 tablet (10 mg total) by mouth at bedtime.    gabapentin 300 MG capsule Commonly known as: NEURONTIN Take 3 capsules (900 mg total) by mouth 2 (two) times daily.   Magnesium Gluconate 500 (27 Mg) MG Tabs Take 500 mg by mouth.   metoprolol succinate 25 MG 24 hr tablet Commonly known as: TOPROL-XL Take 1 tablet (25 mg total) by mouth daily.   multivitamin tablet Take 1 tablet by mouth daily.   VITAMIN B-12 PO Take by mouth daily.   warfarin 2 MG tablet Commonly known as: COUMADIN Take as directed by the anticoagulation clinic. If you are unsure how to take this medication, talk to your nurse or doctor. Original instructions: 6 mg on every day except Mondays and Fridays, take 4 mg on Monday and Friday         Objective:   BP (!) 66/53   Pulse 61   Ht 6\' 1"  (1.854 m)   SpO2 95%   BMI 26.78 kg/m   Wt Readings from Last 3 Encounters:  06/02/21 203 lb (92.1 kg)  05/31/21 204 lb (92.5 kg)  05/09/21 200 lb (90.7 kg)    Physical Exam Vitals and nursing note reviewed.  Constitutional:      General: He is not in acute distress.    Appearance: He is well-developed. He is not diaphoretic.  Eyes:  General: No scleral icterus.    Conjunctiva/sclera: Conjunctivae normal.  Neck:     Thyroid: No thyromegaly.  Cardiovascular:     Rate and Rhythm: Normal rate and regular rhythm.     Heart sounds: Normal heart sounds. No murmur heard. Pulmonary:     Effort: Pulmonary effort is normal. No respiratory distress.     Breath sounds: Normal breath sounds. No wheezing.  Musculoskeletal:        General: Normal range of motion.     Cervical back: Neck supple.  Lymphadenopathy:     Cervical: No cervical adenopathy.  Skin:    General: Skin is warm and dry.     Findings: Lesion (Wound looks completely healed) present. No rash.  Neurological:     Mental Status: He is alert and oriented to person, place, and time.     Coordination: Coordination normal.  Psychiatric:        Mood and Affect: Mood is anxious.        Behavior:  Behavior normal.        Cognition and Memory: Memory is impaired. He exhibits impaired recent memory.      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  Patient's wound is completely healed and looks good.  Patient has right BKA as well.  He does have some difficulty ambulating with this.  He has become more more wheelchair-bound.  Patient will not wear catheters or let himself be cleaned and his wife is not physically capable of taking care of them any further because she cannot lift him.  They do have some help that comes in a couple times a week but he is not letting them help as much as he was before either. Follow up plan: Return if symptoms worsen or fail to improve, for 4 to 6-week INR recheck.  Counseling provided for all of the vaccine components No orders of the defined types were placed in this encounter.   Caryl Pina, MD [facility] 06/14/2021, 4:22 PM

## 2021-06-19 ENCOUNTER — Encounter (HOSPITAL_COMMUNITY): Payer: Self-pay

## 2021-06-19 ENCOUNTER — Ambulatory Visit (INDEPENDENT_AMBULATORY_CARE_PROVIDER_SITE_OTHER): Payer: PPO | Admitting: Licensed Clinical Social Worker

## 2021-06-19 ENCOUNTER — Emergency Department (HOSPITAL_COMMUNITY): Payer: PPO

## 2021-06-19 ENCOUNTER — Telehealth: Payer: Self-pay | Admitting: Family Medicine

## 2021-06-19 ENCOUNTER — Other Ambulatory Visit: Payer: Self-pay

## 2021-06-19 ENCOUNTER — Inpatient Hospital Stay (HOSPITAL_COMMUNITY)
Admission: EM | Admit: 2021-06-19 | Discharge: 2021-06-22 | DRG: 056 | Disposition: A | Discharge status: Skilled Nursing Facility | Payer: PPO | Attending: Internal Medicine | Admitting: Internal Medicine

## 2021-06-19 DIAGNOSIS — R4182 Altered mental status, unspecified: Secondary | ICD-10-CM | POA: Diagnosis not present

## 2021-06-19 DIAGNOSIS — E559 Vitamin D deficiency, unspecified: Secondary | ICD-10-CM | POA: Diagnosis not present

## 2021-06-19 DIAGNOSIS — Z87891 Personal history of nicotine dependence: Secondary | ICD-10-CM | POA: Diagnosis not present

## 2021-06-19 DIAGNOSIS — Z515 Encounter for palliative care: Secondary | ICD-10-CM | POA: Diagnosis not present

## 2021-06-19 DIAGNOSIS — Z993 Dependence on wheelchair: Secondary | ICD-10-CM

## 2021-06-19 DIAGNOSIS — G9341 Metabolic encephalopathy: Secondary | ICD-10-CM | POA: Diagnosis not present

## 2021-06-19 DIAGNOSIS — R296 Repeated falls: Secondary | ICD-10-CM | POA: Diagnosis not present

## 2021-06-19 DIAGNOSIS — E46 Unspecified protein-calorie malnutrition: Secondary | ICD-10-CM | POA: Diagnosis not present

## 2021-06-19 DIAGNOSIS — I517 Cardiomegaly: Secondary | ICD-10-CM | POA: Diagnosis not present

## 2021-06-19 DIAGNOSIS — G629 Polyneuropathy, unspecified: Secondary | ICD-10-CM | POA: Diagnosis present

## 2021-06-19 DIAGNOSIS — M1460 Charcot's joint, unspecified site: Secondary | ICD-10-CM | POA: Diagnosis not present

## 2021-06-19 DIAGNOSIS — I482 Chronic atrial fibrillation, unspecified: Secondary | ICD-10-CM

## 2021-06-19 DIAGNOSIS — Z880 Allergy status to penicillin: Secondary | ICD-10-CM

## 2021-06-19 DIAGNOSIS — K7460 Unspecified cirrhosis of liver: Secondary | ICD-10-CM | POA: Diagnosis not present

## 2021-06-19 DIAGNOSIS — J849 Interstitial pulmonary disease, unspecified: Secondary | ICD-10-CM | POA: Diagnosis not present

## 2021-06-19 DIAGNOSIS — R279 Unspecified lack of coordination: Secondary | ICD-10-CM | POA: Diagnosis not present

## 2021-06-19 DIAGNOSIS — S88111A Complete traumatic amputation at level between knee and ankle, right lower leg, initial encounter: Secondary | ICD-10-CM

## 2021-06-19 DIAGNOSIS — F02C3 Dementia in other diseases classified elsewhere, severe, with mood disturbance: Secondary | ICD-10-CM

## 2021-06-19 DIAGNOSIS — R531 Weakness: Secondary | ICD-10-CM

## 2021-06-19 DIAGNOSIS — F0280 Dementia in other diseases classified elsewhere without behavioral disturbance: Secondary | ICD-10-CM | POA: Diagnosis present

## 2021-06-19 DIAGNOSIS — Z79899 Other long term (current) drug therapy: Secondary | ICD-10-CM

## 2021-06-19 DIAGNOSIS — R1312 Dysphagia, oropharyngeal phase: Secondary | ICD-10-CM | POA: Diagnosis not present

## 2021-06-19 DIAGNOSIS — F02818 Dementia in other diseases classified elsewhere, unspecified severity, with other behavioral disturbance: Secondary | ICD-10-CM | POA: Diagnosis not present

## 2021-06-19 DIAGNOSIS — E785 Hyperlipidemia, unspecified: Secondary | ICD-10-CM | POA: Diagnosis present

## 2021-06-19 DIAGNOSIS — R0902 Hypoxemia: Secondary | ICD-10-CM | POA: Diagnosis not present

## 2021-06-19 DIAGNOSIS — R413 Other amnesia: Secondary | ICD-10-CM

## 2021-06-19 DIAGNOSIS — R627 Adult failure to thrive: Secondary | ICD-10-CM | POA: Diagnosis present

## 2021-06-19 DIAGNOSIS — G4700 Insomnia, unspecified: Secondary | ICD-10-CM | POA: Diagnosis not present

## 2021-06-19 DIAGNOSIS — I10 Essential (primary) hypertension: Secondary | ICD-10-CM | POA: Diagnosis present

## 2021-06-19 DIAGNOSIS — R32 Unspecified urinary incontinence: Secondary | ICD-10-CM | POA: Diagnosis not present

## 2021-06-19 DIAGNOSIS — M6281 Muscle weakness (generalized): Secondary | ICD-10-CM | POA: Diagnosis not present

## 2021-06-19 DIAGNOSIS — Z7189 Other specified counseling: Secondary | ICD-10-CM | POA: Diagnosis not present

## 2021-06-19 DIAGNOSIS — K7469 Other cirrhosis of liver: Secondary | ICD-10-CM | POA: Diagnosis not present

## 2021-06-19 DIAGNOSIS — Z7901 Long term (current) use of anticoagulants: Secondary | ICD-10-CM | POA: Diagnosis not present

## 2021-06-19 DIAGNOSIS — G301 Alzheimer's disease with late onset: Secondary | ICD-10-CM

## 2021-06-19 DIAGNOSIS — E538 Deficiency of other specified B group vitamins: Secondary | ICD-10-CM | POA: Diagnosis not present

## 2021-06-19 DIAGNOSIS — Z820 Family history of epilepsy and other diseases of the nervous system: Secondary | ICD-10-CM

## 2021-06-19 DIAGNOSIS — K219 Gastro-esophageal reflux disease without esophagitis: Secondary | ICD-10-CM

## 2021-06-19 DIAGNOSIS — Z823 Family history of stroke: Secondary | ICD-10-CM | POA: Diagnosis not present

## 2021-06-19 DIAGNOSIS — Z20822 Contact with and (suspected) exposure to covid-19: Secondary | ICD-10-CM | POA: Diagnosis present

## 2021-06-19 DIAGNOSIS — R410 Disorientation, unspecified: Secondary | ICD-10-CM

## 2021-06-19 DIAGNOSIS — I480 Paroxysmal atrial fibrillation: Secondary | ICD-10-CM | POA: Diagnosis not present

## 2021-06-19 DIAGNOSIS — G308 Other Alzheimer's disease: Secondary | ICD-10-CM | POA: Diagnosis not present

## 2021-06-19 DIAGNOSIS — R262 Difficulty in walking, not elsewhere classified: Secondary | ICD-10-CM | POA: Diagnosis not present

## 2021-06-19 DIAGNOSIS — R404 Transient alteration of awareness: Secondary | ICD-10-CM | POA: Diagnosis not present

## 2021-06-19 DIAGNOSIS — Z89511 Acquired absence of right leg below knee: Secondary | ICD-10-CM

## 2021-06-19 DIAGNOSIS — R54 Age-related physical debility: Secondary | ICD-10-CM | POA: Diagnosis present

## 2021-06-19 DIAGNOSIS — G546 Phantom limb syndrome with pain: Secondary | ICD-10-CM | POA: Diagnosis not present

## 2021-06-19 DIAGNOSIS — G309 Alzheimer's disease, unspecified: Secondary | ICD-10-CM | POA: Diagnosis not present

## 2021-06-19 DIAGNOSIS — I4891 Unspecified atrial fibrillation: Secondary | ICD-10-CM | POA: Diagnosis not present

## 2021-06-19 DIAGNOSIS — Z7401 Bed confinement status: Secondary | ICD-10-CM | POA: Diagnosis not present

## 2021-06-19 LAB — URINALYSIS, ROUTINE W REFLEX MICROSCOPIC
Bilirubin Urine: NEGATIVE
Glucose, UA: NEGATIVE mg/dL
Hgb urine dipstick: NEGATIVE
Ketones, ur: NEGATIVE mg/dL
Leukocytes,Ua: NEGATIVE
Nitrite: NEGATIVE
Protein, ur: NEGATIVE mg/dL
Specific Gravity, Urine: 1.008 (ref 1.005–1.030)
pH: 7 (ref 5.0–8.0)

## 2021-06-19 LAB — CBG MONITORING, ED: Glucose-Capillary: 81 mg/dL (ref 70–99)

## 2021-06-19 LAB — COMPREHENSIVE METABOLIC PANEL
ALT: 12 U/L (ref 0–44)
AST: 19 U/L (ref 15–41)
Albumin: 3.3 g/dL — ABNORMAL LOW (ref 3.5–5.0)
Alkaline Phosphatase: 67 U/L (ref 38–126)
Anion gap: 3 — ABNORMAL LOW (ref 5–15)
BUN: 16 mg/dL (ref 8–23)
CO2: 30 mmol/L (ref 22–32)
Calcium: 8.8 mg/dL — ABNORMAL LOW (ref 8.9–10.3)
Chloride: 104 mmol/L (ref 98–111)
Creatinine, Ser: 1.04 mg/dL (ref 0.61–1.24)
GFR, Estimated: >60 mL/min (ref >60)
Glucose, Bld: 60 mg/dL — ABNORMAL LOW (ref 70–99)
Potassium: 4.3 mmol/L (ref 3.5–5.1)
Sodium: 137 mmol/L (ref 135–145)
Total Bilirubin: 0.9 mg/dL (ref 0.3–1.2)
Total Protein: 6.7 g/dL (ref 6.5–8.1)

## 2021-06-19 LAB — CBC WITH DIFFERENTIAL/PLATELET
Abs Immature Granulocytes: 0.01 10*3/uL (ref 0.00–0.07)
Basophils Absolute: 0 10*3/uL (ref 0.0–0.1)
Basophils Relative: 0 %
Eosinophils Absolute: 0.2 10*3/uL (ref 0.0–0.5)
Eosinophils Relative: 3 %
HCT: 37.1 % — ABNORMAL LOW (ref 39.0–52.0)
Hemoglobin: 12.3 g/dL — ABNORMAL LOW (ref 13.0–17.0)
Immature Granulocytes: 0 %
Lymphocytes Relative: 20 %
Lymphs Abs: 1.4 10*3/uL (ref 0.7–4.0)
MCH: 33.6 pg (ref 26.0–34.0)
MCHC: 33.2 g/dL (ref 30.0–36.0)
MCV: 101.4 fL — ABNORMAL HIGH (ref 80.0–100.0)
Monocytes Absolute: 0.8 10*3/uL (ref 0.1–1.0)
Monocytes Relative: 12 %
Neutro Abs: 4.4 10*3/uL (ref 1.7–7.7)
Neutrophils Relative %: 65 %
Platelets: 154 10*3/uL (ref 150–400)
RBC: 3.66 MIL/uL — ABNORMAL LOW (ref 4.22–5.81)
RDW: 13.9 % (ref 11.5–15.5)
WBC: 6.7 10*3/uL (ref 4.0–10.5)
nRBC: 0 % (ref 0.0–0.2)

## 2021-06-19 LAB — RESP PANEL BY RT-PCR (FLU A&B, COVID) ARPGX2
Influenza A by PCR: NEGATIVE
Influenza B by PCR: NEGATIVE
SARS Coronavirus 2 by RT PCR: NEGATIVE

## 2021-06-19 LAB — AMMONIA: Ammonia: <10 umol/L (ref 9–35)

## 2021-06-19 LAB — PROTIME-INR
INR: 2.5 — ABNORMAL HIGH (ref 0.8–1.2)
Prothrombin Time: 26.6 seconds — ABNORMAL HIGH (ref 11.4–15.2)

## 2021-06-19 MED ORDER — WARFARIN - PHARMACIST DOSING INPATIENT: Freq: Every day | Status: DC

## 2021-06-19 MED ORDER — ONDANSETRON HCL 4 MG PO TABS: 4.0000 mg | ORAL_TABLET | Freq: Four times a day (QID) | ORAL | Status: DC | PRN

## 2021-06-19 MED ORDER — DEXTROSE-NACL 5-0.9 % IV SOLN: INTRAVENOUS | Status: AC

## 2021-06-19 MED ORDER — ONDANSETRON HCL 4 MG/2ML IJ SOLN: 4.0000 mg | Freq: Four times a day (QID) | INTRAMUSCULAR | Status: DC | PRN

## 2021-06-19 MED ORDER — WARFARIN SODIUM 2 MG PO TABS
2.0000 mg | ORAL_TABLET | ORAL | Status: AC
  Administered 2021-06-19: 2 mg via ORAL
  Filled 2021-06-19: qty 1

## 2021-06-19 MED ORDER — ACETAMINOPHEN 650 MG RE SUPP: 650.0000 mg | Freq: Four times a day (QID) | RECTAL | Status: DC | PRN

## 2021-06-19 MED ORDER — ACETAMINOPHEN 325 MG PO TABS: 650.0000 mg | ORAL_TABLET | Freq: Four times a day (QID) | ORAL | Status: DC | PRN

## 2021-06-19 MED ORDER — POLYETHYLENE GLYCOL 3350 17 G PO PACK: 17.0000 g | PACK | Freq: Every day | ORAL | Status: DC | PRN

## 2021-06-19 MED ORDER — WARFARIN SODIUM 2 MG PO TABS
6.0000 mg | ORAL_TABLET | ORAL | Status: DC
  Administered 2021-06-20 – 2021-06-21 (×2): 6 mg via ORAL
  Filled 2021-06-19: qty 1
  Filled 2021-06-19 (×2): qty 3

## 2021-06-19 MED ORDER — SODIUM CHLORIDE 0.9 % IV BOLUS
250.0000 mL | Freq: Once | INTRAVENOUS | Status: AC
  Administered 2021-06-19: 250 mL via INTRAVENOUS

## 2021-06-19 MED ORDER — METOPROLOL SUCCINATE ER 25 MG PO TB24
25.0000 mg | ORAL_TABLET | Freq: Every day | ORAL | Status: DC
  Administered 2021-06-21 – 2021-06-22 (×2): 25 mg via ORAL
  Filled 2021-06-19 (×3): qty 1

## 2021-06-19 MED ORDER — WARFARIN SODIUM 2 MG PO TABS: 4.0000 mg | ORAL_TABLET | ORAL | Status: DC

## 2021-06-19 MED ORDER — MELATONIN 3 MG PO TABS
9.0000 mg | ORAL_TABLET | Freq: Every day | ORAL | Status: DC
  Administered 2021-06-20 – 2021-06-21 (×3): 9 mg via ORAL
  Filled 2021-06-19 (×3): qty 3

## 2021-06-19 NOTE — Chronic Care Management (AMB) (Signed)
Chronic Care Management    Clinical Social Work Note  06/19/2021 Name: Brett Pearson. MRN: [id number] DOB: [birth year]  Brett Pearson. is a 80 y.o. year old male who is a primary care patient of [name], [name], MD. The CCM team was consulted to assist the patient with chronic disease management and/or care coordination needs related to: [name] .   Engaged with patient / [name], Significant Other for client ,by telephone for follow up visit in response to provider referral for social work chronic care management and care coordination services.   Consent to Services:  The patient was given information about Chronic Care Management services, agreed to services, and gave verbal consent prior to initiation of services.  Please see initial visit note for detailed documentation.   Patient agreed to services and consent obtained.   Assessment: Review of patient past medical history, allergies, medications, and health status, including review of relevant consultants reports was performed today as part of a comprehensive evaluation and provision of chronic care management and care coordination services.     SDOH (Social Determinants of Health) assessments and interventions performed:  SDOH Interventions    Flowsheet Row Most Recent Value  SDOH Interventions   Physical Activity Interventions Other (Comments)  [mobility challenges,  client uses a wheelchair to ambulate]  Stress Interventions Other (Comment)  [client has stress related to memory impairment ,  client has stress related to managing medical needs]  Depression Interventions/Treatment  Counseling        Advanced Directives Status: See Vynca application for related entries.  CCM Care Plan  Allergies  Allergen Reactions   Amoxicillin     Other reaction(s): HIVES    Outpatient Encounter Medications as of 06/19/2021  Medication Sig Note   cholecalciferol (VITAMIN D) 1000 UNITS tablet Take 1,000 Units  by mouth daily.    Cyanocobalamin (VITAMIN B-12 PO) Take by mouth daily.    donepezil (ARICEPT) 10 MG tablet Take 1 tablet (10 mg total) by mouth at bedtime.    gabapentin (NEURONTIN) 300 MG capsule Take 3 capsules (900 mg total) by mouth 2 (two) times daily.    Magnesium Gluconate 500 (27 Mg) MG TABS Take 500 mg by mouth. 07/11/2016: Received from: [facility] Received Sig: Take 500 mg by mouth.   metoprolol succinate (TOPROL-XL) 25 MG 24 hr tablet Take 1 tablet (25 mg total) by mouth daily.    Multiple Vitamin (MULTIVITAMIN) tablet Take 1 tablet by mouth daily.    warfarin (COUMADIN) 2 MG tablet 6 mg on every day except Mondays and Fridays, take 4 mg on Monday and Friday    No facility-administered encounter medications on file as of 06/19/2021.    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[place]) 03/06/2017   Interstitial lung disease ([place]) 02/08/2017   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   Alzheimer's dementia ([place]) 08/03/2016   Vitamin B12 deficiency 02/13/2016   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 ([place]) 07/27/2015   Essential hypertension 07/13/2015   Obesity (BMI 30-39.9) 07/13/2015   Chronic atrial fibrillation ([place]) 11/06/2012  Conditions to be addressed/monitored: monitor client completion of ADLs  Care Plan : LCSW Care Plan  Updates made by [name], LCSW since 06/19/2021 12:00 AM     Problem: Coping Skills (General Plan of Care)      Goal: Coping Skills Enhanced: Complete ADLs daily, as able   Start Date:  06/19/2021  Expected End Date: 09/14/2021  This Visit's Progress: Not on track  Recent Progress: Not on track  Priority: High  Note:   Current barriers:   Patient in need of assistance with connecting to community resources for help in completing ADLs Patient is unable to independently navigate community resource options without care coordination support Mobility issues Memory issues  Clinical Goals:   Patient will communicate with LCSW in next 30 days to discuss ADLs completion of client Patient will communicate will RNCM as needed in next 30 days for nursing support Patient will cooperate with in home care support staff helping him weekly in the home as scheduled  Clinical Interventions:  Collaboration with [name], [name], MD regarding development and update of comprehensive plan of care as evidenced by provider attestation and co-signature Discussed with [facility] , Significant Other, current needs of client. She said caring for needs of client has been difficult recently. She said that due to her own health needs, she can no longer care for client in the home environment. She has informed Dr. Bryon Lions that she can no longer care for client at home. Client has gone to [facility] today for UTI.  She has informed hospital staff that she can no longer care for client at home.  She does have client on waiting list at [facility] and at [facility] .  Carole asked LCSW if she could call LCSW back tomorrow to discuss client needs further. LCSW agreed to this plan. Chart reviewed. Client has ongoing memory issues, incontinency issues, and recently client has displayed behavioral issues. [name] has stated she can no longer care for client needs in the home.   Patient Coping Skills: Has support from [facility] Attends scheduled medical appointments  Patient Deficits:  Memory issues Mobility issues  Patient Goals: In next 30 days, client  will Attend scheduled medical appointments Take medications as prescribed Talk regularly with [name] about client needs -  Follow Up Plan: [name], Significant Other for client, will call LCSW tomorrow to discuss client needs.      Norva Riffle.Daelin Haste MSW, LCSW Licensed Clinical Social Worker [facility] Care Management [phone]

## 2021-06-19 NOTE — Progress Notes (Signed)
Due to patient's disorientation, he is unable to answer admission questions accurately. No responsible party at bedside to assist with answering questions.This nurse attempted to contact responsible party x2 to notify them of patient's admission, but was unsuccessful.

## 2021-06-19 NOTE — ED Notes (Signed)
Pt's wife called and is requesting that pt be evaluated for UTI and nursing home placement. She can be contacted at [phone], Glenice Laine.

## 2021-06-19 NOTE — H&P (Signed)
History and Physical    Brett Pearson. YIA:[id number] DOB: 13-Feb-1941 DOA: 06/19/2021  PCP: [name], [name], MD   Patient coming from: Home  I have personally briefly reviewed patient's old medical records in [place]  Chief Complaint: AMS  HPI: Brett Pearson. is a 80 y.o. male with medical history significant for dementia, atrial fibrillation, cirrhosis, hypertension, interstitial lung disease, right BKA. Patient to the ED via EMS reports that patient's mental status declined today.  Patient's wife reported that today patient was unable or did not remember how to get out of the bed, he has significant dementia but this was unusual for him.  She also reports that patient's urine had an odor to it, and he was refusing to wear his condom catheter at home as he has urinary incontinence.  She reports patient has been confused over the past 2 days. On my evaluation patient is awake and alert, able to tell me his name only.  Otherwise not oriented to place time or situation.  He is responding to questions, but his speech is confused.  He denies pain.  Called significant other - Heffley carole, only contact listed, no response.  ED Course: Stable vitals.  97.7.  WBC 6.7.  INR 2.5.  Stable creatinine-0.04.  Head CT without acute abnormality.  Chest x-ray shows left lower lobe linear densities likely scarring.  UA not suggestive of urinary tract infection.  COVID test negative.  Hospitalist to admit for altered mental status.  Review of Systems: Unable to ascertain due to patient's dementia and altered mental status..  Past Medical History:  Diagnosis Date   Atrial fibrillation ([place])    Hypokalemia    Neuropathy    bilateral feet    S/P BKA (below knee amputation) ([place]) 2009   right    Venous insufficiency     Past Surgical History:  Procedure Laterality Date   BELOW KNEE LEG AMPUTATION Right    EYE SURGERY Bilateral    cataracts   HIP SURGERY Right    TONSILLECTOMY  AND ADENOIDECTOMY       reports that he quit smoking about 62 years ago. His smoking use included cigarettes. He has never used smokeless tobacco. He reports that he does not drink alcohol and does not use drugs.  Allergies  Allergen Reactions   Amoxicillin     Other reaction(s): HIVES    Family History  Problem Relation Age of Onset   Stroke Mother    Parkinson's disease Father    Prior to Admission medications   Medication Sig Start Date End Date Taking? Authorizing Provider  Ascorbic Acid (VITAMIN C) 1000 MG tablet Take 1,000 mg by mouth daily.   Yes [provider]  cholecalciferol (VITAMIN D) 1000 UNITS tablet Take 2,000 Units by mouth daily.   Yes [provider]  Cyanocobalamin (VITAMIN B-12 PO) Take by mouth daily.   Yes [provider]  gabapentin (NEURONTIN) 300 MG capsule Take 3 capsules (900 mg total) by mouth 2 (two) times daily. 10/05/20  Yes [name], [name], MD  Magnesium Gluconate 500 (27 Mg) MG TABS Take 500 mg by mouth.   Yes [provider]  Melatonin 10 MG TABS Take 1 tablet by mouth daily.   Yes [provider]  metoprolol succinate (TOPROL-XL) 25 MG 24 hr tablet Take 1 tablet (25 mg total) by mouth daily. 05/31/21  Yes [name], [name], MD  Multiple Vitamin (MULTIVITAMIN) tablet Take 1 tablet by mouth daily.  Yes [provider]  warfarin (COUMADIN) 2 MG tablet 6 mg on every day except Mondays and Fridays, take 4 mg on Monday and Friday 05/31/21  Yes [name], [name], MD  donepezil (ARICEPT) 10 MG tablet Take 1 tablet (10 mg total) by mouth at bedtime. Patient not taking: No sig reported 04/18/21   [name], [name], MD  doxycycline (VIBRA-TABS) 100 MG tablet Take 100 mg by mouth 2 (two) times daily. Patient not taking: Reported on 06/19/2021 06/01/21   [provider]    Physical Exam: Vitals:   06/19/21 1700 06/19/21 1730 06/19/21 1843 06/19/21 2122  BP: (!) 101/57 (!) 120/98 121/76  (!) 142/95  Pulse: (!) 55 62 (!) 59 66  Resp: 16 12 16 18   Temp:    97.8 F (36.6 C)  TempSrc:    Oral  SpO2: 100% 98% 100% 100%  Weight:      Height:        Constitutional: calm, comfortable Vitals:   06/19/21 1700 06/19/21 1730 06/19/21 1843 06/19/21 2122  BP: (!) 101/57 (!) 120/98 121/76 (!) 142/95  Pulse: (!) 55 62 (!) 59 66  Resp: 16 12 16 18   Temp:    97.8 F (36.6 C)  TempSrc:    Oral  SpO2: 100% 98% 100% 100%  Weight:      Height:       Eyes: PERRL, lids and conjunctivae normal ENMT: Mucous membranes are moist.   Neck: normal, supple, no masses, no thyromegaly Respiratory: clear to auscultation bilaterally, no wheezing, no crackles. Normal respiratory effort. No accessory muscle use.  Cardiovascular: Regular rate and rhythm, no murmurs / rubs / gallops. No extremity edema. 2+ pedal pulses.   Abdomen: no tenderness, no masses palpated. No hepatosplenomegaly. Bowel sounds positive.  Musculoskeletal: no clubbing / cyanosis.  Right below-knee amputation.  Good ROM, no contractures. Normal muscle tone.  Skin: Chronic Stasis changes to the lower extremity, no rashes, lesions, ulcers. No induration Neurologic: 4+5 strength in all extremities.  Good grip strength bilaterally.  No facial asymmetry.  Speech is confused but fluent. Psychiatric: Alert, oriented to person only, not oriented to place time or situation.    Labs on Admission: I have personally reviewed following labs and imaging studies  CBC: Recent Labs  Lab 06/19/21 1526  WBC 6.7  NEUTROABS 4.4  HGB 12.3*  HCT 37.1*  MCV 101.4*  PLT 734   Basic Metabolic Panel: Recent Labs  Lab 06/19/21 1526  NA 137  K 4.3  CL 104  CO2 30  GLUCOSE 60*  BUN 16  CREATININE 1.04  CALCIUM 8.8*   Liver Function Tests: Recent Labs  Lab 06/19/21 1526  AST 19  ALT 12  ALKPHOS 67  BILITOT 0.9  PROT 6.7  ALBUMIN 3.3*   Coagulation Profile: Recent Labs  Lab 06/19/21 1526  INR 2.5*   CBG: Recent Labs  Lab  06/19/21 1950  GLUCAP 81   Urine analysis:    Component Value Date/Time   COLORURINE YELLOW 06/19/2021 [place] 06/19/2021 1830   APPEARANCEUR Cloudy (A) 05/31/2021 1643   LABSPEC 1.008 06/19/2021 1830   [place] 7.0 06/19/2021 1830   GLUCOSEU NEGATIVE 06/19/2021 1830   HGBUR NEGATIVE 06/19/2021 1830   BILIRUBINUR NEGATIVE 06/19/2021 1830   BILIRUBINUR Negative 05/31/2021 [place] 06/19/2021 1830   PROTEINUR NEGATIVE 06/19/2021 1830   NITRITE NEGATIVE 06/19/2021 1830   LEUKOCYTESUR NEGATIVE 06/19/2021 1830    Radiological Exams on Admission: DG Chest  2 View  Result Date: 06/19/2021 CLINICAL DATA:  Altered mental status EXAM: CHEST - 2 VIEW COMPARISON:  Previous studies including the examination of 07/31/2018 FINDINGS: Transverse diameter of heart is increased. Apparent shift of mediastinum to the right may be due to rotation. There are no signs of alveolar pulmonary edema. Linear densities in the left lower lung fields have not changed significantly, possibly scarring. There is no focal pulmonary consolidation. There is no significant pleural effusion or pneumothorax. There is a linear high density along the right margin of heart, possibly pericardial calcification which has not changed significantly. Colon appears to be interposed between the liver and anterior abdominal wall in the right subdiaphragmatic location. IMPRESSION: Cardiomegaly. There are no signs of pulmonary edema or focal pulmonary consolidation. Linear densities in the left lower lung fields have not changed significantly, possibly suggesting scarring. Electronically Signed   By: Elmer Picker M.D.   On: 06/19/2021 16:54   CT Head Wo Contrast  Result Date: 06/19/2021 CLINICAL DATA:  Mental status change. EXAM: CT HEAD WITHOUT CONTRAST TECHNIQUE: Contiguous axial images were obtained from the base of the skull through the vertex without intravenous contrast. COMPARISON:  None.  BRAIN: BRAIN Cerebral ventricle sizes are concordant with the degree of cerebral volume loss. Patchy and confluent areas of decreased attenuation are noted throughout the deep and periventricular white matter of the cerebral hemispheres bilaterally, compatible with chronic microvascular ischemic disease. No evidence of large-territorial acute infarction. No parenchymal hemorrhage. No mass lesion. No extra-axial collection. No mass effect or midline shift. No hydrocephalus. Basilar cisterns are patent. Vascular: No hyperdense vessel. Atherosclerotic calcifications are present within the cavernous internal carotid arteries. Skull: No acute fracture or focal lesion. Sinuses/Orbits: Paranasal sinuses and mastoid air cells are clear. Likely bilateral lens replacement. Otherwise orbits are unremarkable. Other: None. IMPRESSION: No acute intracranial abnormality. Electronically Signed   By: Iven Finn M.D.   On: 06/19/2021 16:52    EKG: Independently reviewed.  Atrial fibrillation rate 56.  QTc 467.  Incomplete right bundle branch block.  No change from prior.  Assessment/Plan Principal Problem:   Acute metabolic encephalopathy Active Problems:   Chronic atrial fibrillation (HCC)   Essential hypertension   Alzheimer's dementia (HCC)   Cryptogenic cirrhosis (HCC)   Urinary incontinence    Acute metabolic encephalopathy with baseline Alzheimer's dementia-currently awake and alert, oriented to person only.  Head CT without acute abnormality.  Stable vitals afebrile without leukocytosis.  UA not suggestive of UTI.  And chest x-ray without acute abnormality.  Likely progression of dementia.  I am unable to determine patient's baseline. - EDP was able to reach significant other /wife, she reports that she is no longer able to take care of patient at home, and patient needs SNF placement.  -Check ammonia level - 214mls given in ED, continue D 5 N/s 75cc/hr x 15hrs - PT eval, TOC consult, patient will need  placement. -Hold gabapentin for now  Cryptogenic liver cirrhosis -appears stable without evidence of chronic liver disease.  Liver Enzymes unremarkable. -Check Ammonia level  Atrial fibrillation-rate controlled and on anticoagulation with warfarin.  INR therapeutic at 2.5. -Resume warfarin pharmacy to dose -Resume metoprolol  Hypertension-stable. -Resume metoprolol  DVT prophylaxis: Warfarin Code Status: Full code for now. Family Communication: Only contact listed on file - significant other- Heffley Carole, I called but unable to reach her.  Disposition Plan: ~ 2 days Consults called: None Admission status:  Obs med surg   Bethena Roys MD [facility]  06/19/2021, 9:53 PM

## 2021-06-19 NOTE — Telephone Encounter (Signed)
Patient wife Brett Pearson called in and stated patient is unable to remember how to get out of bed. She states she is unable to get the patient out of the bed and has called EMS to help. She states that this is unusual for him even with his alzheimer's. She also states this is the 5th time that the nurse from [facility] has not showed up. I advised her to have patient to be evaluated by the ER since she will need EMS to transport patient.

## 2021-06-19 NOTE — ED Notes (Addendum)
Wife updated on pt's arrival and assisted with triage questions due to pts mental status. Wife expressed need for SNF placement and says that she is no longer able to take care of him.

## 2021-06-19 NOTE — ED Triage Notes (Signed)
Pt arrived via EMS. Pt's wife stated that pt has hx of UTIs and that his mental status has declined today.

## 2021-06-19 NOTE — ED Provider Notes (Signed)
[facility] EMERGENCY DEPARTMENT Provider Note   CSN: [id number] Arrival date & time: 06/19/21  1414     History Chief Complaint  Patient presents with   Altered Mental Status    Brett Pearson. is a 80 y.o. male presenting for altered mental status.  Level 5 caveat due to altered mental status.  Per chart review, patient's wife sent patient here due to worsening confusion.  She also told the nurse over the phone that she does not feel she can take care of her husband anymore at home, and that he needs SNF placement.  Discussed with wife via telephone.  She states patient has been more confused than normal the past 2 days.  He was unable to get out of bed today either due to weakness or because he could not remember how to get out of bed.  He often forgets that he has a prosthesis, and therefore is a high fall risk.  He has had an abnormal smell to his urine in the past few days, and reported feeling cold.  He refuses a condom cath at home, and this is incontinent of urine at baseline.  He is wheelchair-bound.  She states she is unable to take care of him for the condition he is in currently.   Pt is unable to provide any history as to why he is here.  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   Gastroesophageal reflux disease without esophagitis 03/20/2018   History of colon polyps 03/20/2018   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   Vitamin D deficiency 10/14/2017   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    Interstitial lung disease ([place]) 02/08/2017   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   Alzheimer's dementia ([place]) 08/03/2016   Vitamin B12 deficiency 02/13/2016   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/2015   Essential hypertension 07/13/2015   Obesity (BMI 30-39.9) 07/13/2015   Chronic atrial fibrillation ([place]) 11/06/2012    Past Surgical History:  Procedure Laterality Date   BELOW KNEE LEG AMPUTATION Right    EYE SURGERY Bilateral    cataracts   HIP SURGERY Right    TONSILLECTOMY AND ADENOIDECTOMY         Family History  Problem Relation Age of Onset   Stroke Mother    Parkinson's disease Father     Social History   Tobacco Use   Smoking status: Former    Types: Cigarettes    Quit date: 06/15/1959    Years since quitting: 62.0   Smokeless tobacco: Never  Vaping Use   Vaping Use: Never used  Substance Use Topics   Alcohol use: No    Alcohol/week: 0.0 standard drinks   Drug use: No    Home Medications Prior to Admission medications   Medication Sig Start Date End Date Taking? Authorizing Provider  cholecalciferol (VITAMIN D) 1000 UNITS tablet Take 1,000 Units by mouth daily.    [provider]  Cyanocobalamin (VITAMIN B-12 PO) Take by mouth daily.    [provider]  donepezil (ARICEPT) 10 MG tablet Take 1 tablet (10 mg total) by mouth at bedtime. 04/18/21   [name], [name], MD  gabapentin (NEURONTIN) 300 MG capsule Take 3 capsules (900 mg total) by mouth 2 (two) times daily. 10/05/20   [name], [name], MD  Magnesium Gluconate 500 (27 Mg) MG TABS Take 500 mg by mouth.    [provider]  metoprolol succinate (TOPROL-XL) 25 MG 24 hr tablet Take 1 tablet (25 mg total) by mouth daily. 05/31/21   [name], [name], MD  Multiple Vitamin (MULTIVITAMIN) tablet Take 1 tablet by mouth daily.    [provider]  warfarin (COUMADIN) 2 MG tablet 6 mg on every day except Mondays and  Fridays, take 4 mg on Monday and Friday 05/31/21   [name], [name], MD    Allergies    Amoxicillin  Review of Systems   Review of Systems  Unable to perform ROS: Mental status change   Physical Exam Updated Vital Signs BP (!) 120/98   Pulse 62   Temp 98.2 F (36.8 C) (Rectal)   Resp 12   Ht 6\' 1"  (1.854 m)   Wt 92.1 kg   SpO2 98%   BMI 26.78 kg/m   Physical Exam Vitals and nursing note reviewed.  Constitutional:      General: He is not in acute distress.    Appearance: Normal appearance.     Comments: nontoxic  HENT:     Head: Normocephalic and atraumatic.  Eyes:     Conjunctiva/sclera: Conjunctivae normal.     Pupils: Pupils are equal, round, and reactive to light.  Cardiovascular:     Rate and Rhythm: Normal rate and regular rhythm.     Pulses: Normal pulses.  Pulmonary:     Effort: Pulmonary effort is normal. No respiratory distress.     Breath sounds: Normal breath sounds. No wheezing.     Comments: Speaking in full sentences.  Clear lung sounds in all fields. Abdominal:     General: There is no distension.     Palpations: Abdomen is soft.     Tenderness: There is no abdominal tenderness.  Musculoskeletal:        General: Normal range of motion.     Cervical back: Normal range of motion and neck supple.     Comments: R BKA L leg with chronic skin changes. No infection  Skin:    General: Skin is warm and dry.     Capillary Refill: Capillary refill takes less than 2 seconds.  Neurological:     Mental Status: He is alert. He is confused.     Comments: Confused.  Has rambling speech and story about being in a van for 25 hours before he was brought here.    Psychiatric:        Mood and Affect: Mood and affect normal.        Speech: Speech normal.        Behavior: Behavior normal.    ED Results / Procedures / Treatments   Labs (all labs ordered are listed, but only abnormal results are displayed) Labs Reviewed  CBC WITH DIFFERENTIAL/PLATELET -  Abnormal; Notable for the following components:      Result Value   RBC 3.66 (*)    Hemoglobin 12.3 (*)    HCT 37.1 (*)    MCV 101.4 (*)    All other components within normal  limits  COMPREHENSIVE METABOLIC PANEL - Abnormal; Notable for the following components:   Glucose, Bld 60 (*)    Calcium 8.8 (*)    Albumin 3.3 (*)    Anion gap 3 (*)    All other components within normal limits  PROTIME-INR - Abnormal; Notable for the following components:   Prothrombin Time 26.6 (*)    INR 2.5 (*)    All other components within normal limits  RESP PANEL BY RT-PCR (FLU A&B, COVID) ARPGX2  URINALYSIS, ROUTINE W REFLEX MICROSCOPIC    EKG EKG Interpretation  Date/Time:  Monday June 19 2021 16:06:32 EST Ventricular Rate:  56 PR Interval:    QRS Duration: 115 QT Interval:  483 QTC Calculation: 467 R Axis:   -1 Text Interpretation: Atrial fibrillation Incomplete right bundle branch block No prior for comparison Confirmed by Nanda Quinton [id number]) on 06/19/2021 4:20:04 PM  Radiology DG Chest 2 View  Result Date: 06/19/2021 CLINICAL DATA:  Altered mental status EXAM: CHEST - 2 VIEW COMPARISON:  Previous studies including the examination of 07/31/2018 FINDINGS: Transverse diameter of heart is increased. Apparent shift of mediastinum to the right may be due to rotation. There are no signs of alveolar pulmonary edema. Linear densities in the left lower lung fields have not changed significantly, possibly scarring. There is no focal pulmonary consolidation. There is no significant pleural effusion or pneumothorax. There is a linear high density along the right margin of heart, possibly pericardial calcification which has not changed significantly. Colon appears to be interposed between the liver and anterior abdominal wall in the right subdiaphragmatic location. IMPRESSION: Cardiomegaly. There are no signs of pulmonary edema or focal pulmonary consolidation. Linear densities in the left lower lung  fields have not changed significantly, possibly suggesting scarring. Electronically Signed   By: Elmer Picker M.D.   On: 06/19/2021 16:54   CT Head Wo Contrast  Result Date: 06/19/2021 CLINICAL DATA:  Mental status change. EXAM: CT HEAD WITHOUT CONTRAST TECHNIQUE: Contiguous axial images were obtained from the base of the skull through the vertex without intravenous contrast. COMPARISON:  None. BRAIN: BRAIN Cerebral ventricle sizes are concordant with the degree of cerebral volume loss. Patchy and confluent areas of decreased attenuation are noted throughout the deep and periventricular white matter of the cerebral hemispheres bilaterally, compatible with chronic microvascular ischemic disease. No evidence of large-territorial acute infarction. No parenchymal hemorrhage. No mass lesion. No extra-axial collection. No mass effect or midline shift. No hydrocephalus. Basilar cisterns are patent. Vascular: No hyperdense vessel. Atherosclerotic calcifications are present within the cavernous internal carotid arteries. Skull: No acute fracture or focal lesion. Sinuses/Orbits: Paranasal sinuses and mastoid air cells are clear. Likely bilateral lens replacement. Otherwise orbits are unremarkable. Other: None. IMPRESSION: No acute intracranial abnormality. Electronically Signed   By: Iven Finn M.D.   On: 06/19/2021 16:52    Procedures Procedures   Medications Ordered in ED Medications  sodium chloride 0.9 % bolus 250 mL (has no administration in time range)    ED Course  I have reviewed the triage vital signs and the nursing notes.  Pertinent labs & imaging results that were available during my care of the patient were reviewed by me and considered in my medical decision making (see chart for details).    MDM Rules/Calculators/A&P                           Patient presenting for evaluation of worsening confusion.  On exam,  patient appears nontoxic.  He is confused on my evaluation,  although calm and cooperative.  Will obtain labs, chest x-ray, urine, head CT.  Labs interpreted by me, overall reassuring.  Head CT negative for acute findings.  Chest x-ray without signs of infection.  Urine is still pending.  In the setting of acute confusion and wife's inability to take care of patient at home, will call for admission.  Discussed with Dr. Denton Brick from [facility], patient to be admitted.  Final Clinical Impression(s) / ED Diagnoses Final diagnoses:  Confusion  Weakness    Rx / DC Orders ED Discharge Orders     None        Franchot Heidelberg, PA-C 06/19/21 Duanne Limerick, MD 06/20/21 1052

## 2021-06-19 NOTE — Progress Notes (Signed)
ANTICOAGULATION CONSULT NOTE - Initial Consult  Pharmacy Consult for warfarin Indication: atrial fibrillation  Allergies  Allergen Reactions   Amoxicillin     Other reaction(s): HIVES    Patient Measurements: Height: 6\' 1"  (185.4 cm) Weight: 92.1 kg (203 lb) IBW/kg (Calculated) : 79.9  Vital Signs: Temp: 97.8 F (36.6 C) (11/14 2122) Temp Source: Oral (11/14 2122) BP: 142/95 (11/14 2122) Pulse Rate: 66 (11/14 2122)  Labs: Recent Labs    06/19/21 1526  HGB 12.3*  HCT 37.1*  PLT 154  LABPROT 26.6*  INR 2.5*  CREATININE 1.04    Estimated Creatinine Clearance: 64 mL/min (by C-G formula based on SCr of 1.04 mg/dL).   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 M presents with AMS. Pt on warfarin PTA for afib. Admission INR 2.5 (therapeutic). CBC ok on admission. Home dose: 6mg  daily except for 4mg  on Mon and Fri  Goal of Therapy:  INR 2-3 Monitor platelets by anticoagulation protocol: Yes   Plan:  Daily PT/INR Warfarin 2mg  now to finish today's dose of 4mg  (wife only gave pt one tablet earlier today as that is all she had left Warfarin 6mg  daily except for 4mg  on Mon and Fri  Sherlon Handing, PharmD, BCPS Please see amion for complete clinical pharmacist phone list 06/19/2021,9:56 PM

## 2021-06-19 NOTE — Patient Instructions (Addendum)
Visit Information  Patient Goals:  Manage Emotions. Complete ADLs daily as able  Timeframe:  Short-Term Goal Priority:  High  Progress: Not On Track Start Date:            06/19/21                 Expected End Date:          09/14/21            Follow Up Date  06/20/21. [name] to call LCSW on 06/20/21 to further discuss client needs   Manage Emotions: Complete ADLs daily as able    Why is this important?   When you are stressed, down or upset, your body reacts too.  For example, your blood pressure may get higher; you may have a headache or stomachache.  When your emotions get the best of you, your body's ability to fight off cold and flu gets weak.  These steps will help you manage your emotions.     Patient Coping Skills: Has support from [facility] Attends scheduled medical appointments  Patient Deficits:  Memory issues Mobility issues  Patient Goals: In next 30 days, client will Attend scheduled medical appointments Take medications as prescribed Talk regularly with [name] about client needs -  Follow Up Plan: [name], Significant Other for client, to call LCSW on 06/20/21 to further discuss needs of client.    Norva Riffle.Kalep Full MSW, LCSW Licensed Clinical Social Worker [facility] Care Management [phone]

## 2021-06-19 NOTE — Progress Notes (Signed)
Went into patient's room to do his initial assessment. Patient is alert but disoriented to time, place and situation. He was speaking of military events from his past and I do see he has on a veteran's ball cap. We spoke about different military bases and he could remember a few of them. He could not remember his MOS but could tell me what he did in the [employer]. He could not tell me his rank but could tell me he was over a group of men. When I asked was it a battalion he laughed and said not that big. Very pleasant patient

## 2021-06-20 ENCOUNTER — Ambulatory Visit: Payer: PPO | Admitting: Licensed Clinical Social Worker

## 2021-06-20 DIAGNOSIS — E785 Hyperlipidemia, unspecified: Secondary | ICD-10-CM | POA: Diagnosis present

## 2021-06-20 DIAGNOSIS — F0280 Dementia in other diseases classified elsewhere without behavioral disturbance: Secondary | ICD-10-CM | POA: Diagnosis not present

## 2021-06-20 DIAGNOSIS — J849 Interstitial pulmonary disease, unspecified: Secondary | ICD-10-CM | POA: Diagnosis present

## 2021-06-20 DIAGNOSIS — R413 Other amnesia: Secondary | ICD-10-CM

## 2021-06-20 DIAGNOSIS — R627 Adult failure to thrive: Secondary | ICD-10-CM | POA: Diagnosis present

## 2021-06-20 DIAGNOSIS — E538 Deficiency of other specified B group vitamins: Secondary | ICD-10-CM

## 2021-06-20 DIAGNOSIS — K7469 Other cirrhosis of liver: Secondary | ICD-10-CM | POA: Diagnosis not present

## 2021-06-20 DIAGNOSIS — R296 Repeated falls: Secondary | ICD-10-CM

## 2021-06-20 DIAGNOSIS — G629 Polyneuropathy, unspecified: Secondary | ICD-10-CM | POA: Diagnosis present

## 2021-06-20 DIAGNOSIS — Z20822 Contact with and (suspected) exposure to covid-19: Secondary | ICD-10-CM | POA: Diagnosis present

## 2021-06-20 DIAGNOSIS — R54 Age-related physical debility: Secondary | ICD-10-CM | POA: Diagnosis present

## 2021-06-20 DIAGNOSIS — Z515 Encounter for palliative care: Secondary | ICD-10-CM | POA: Diagnosis not present

## 2021-06-20 DIAGNOSIS — Z89511 Acquired absence of right leg below knee: Secondary | ICD-10-CM | POA: Diagnosis not present

## 2021-06-20 DIAGNOSIS — S88111A Complete traumatic amputation at level between knee and ankle, right lower leg, initial encounter: Secondary | ICD-10-CM

## 2021-06-20 DIAGNOSIS — K219 Gastro-esophageal reflux disease without esophagitis: Secondary | ICD-10-CM

## 2021-06-20 DIAGNOSIS — G309 Alzheimer's disease, unspecified: Secondary | ICD-10-CM | POA: Diagnosis present

## 2021-06-20 DIAGNOSIS — I10 Essential (primary) hypertension: Secondary | ICD-10-CM | POA: Diagnosis present

## 2021-06-20 DIAGNOSIS — I482 Chronic atrial fibrillation, unspecified: Secondary | ICD-10-CM | POA: Diagnosis not present

## 2021-06-20 DIAGNOSIS — Z993 Dependence on wheelchair: Secondary | ICD-10-CM | POA: Diagnosis not present

## 2021-06-20 DIAGNOSIS — Z820 Family history of epilepsy and other diseases of the nervous system: Secondary | ICD-10-CM | POA: Diagnosis not present

## 2021-06-20 DIAGNOSIS — G301 Alzheimer's disease with late onset: Secondary | ICD-10-CM | POA: Diagnosis not present

## 2021-06-20 DIAGNOSIS — Z7901 Long term (current) use of anticoagulants: Secondary | ICD-10-CM | POA: Diagnosis not present

## 2021-06-20 DIAGNOSIS — Z7189 Other specified counseling: Secondary | ICD-10-CM | POA: Diagnosis not present

## 2021-06-20 DIAGNOSIS — Z87891 Personal history of nicotine dependence: Secondary | ICD-10-CM | POA: Diagnosis not present

## 2021-06-20 DIAGNOSIS — G308 Other Alzheimer's disease: Secondary | ICD-10-CM | POA: Diagnosis not present

## 2021-06-20 DIAGNOSIS — Z79899 Other long term (current) drug therapy: Secondary | ICD-10-CM | POA: Diagnosis not present

## 2021-06-20 DIAGNOSIS — R410 Disorientation, unspecified: Secondary | ICD-10-CM | POA: Diagnosis present

## 2021-06-20 DIAGNOSIS — Z823 Family history of stroke: Secondary | ICD-10-CM | POA: Diagnosis not present

## 2021-06-20 DIAGNOSIS — G9341 Metabolic encephalopathy: Secondary | ICD-10-CM | POA: Diagnosis not present

## 2021-06-20 DIAGNOSIS — Z880 Allergy status to penicillin: Secondary | ICD-10-CM | POA: Diagnosis not present

## 2021-06-20 DIAGNOSIS — R32 Unspecified urinary incontinence: Secondary | ICD-10-CM | POA: Diagnosis present

## 2021-06-20 LAB — PROTIME-INR
INR: 2.3 — ABNORMAL HIGH (ref 0.8–1.2)
Prothrombin Time: 25 seconds — ABNORMAL HIGH (ref 11.4–15.2)

## 2021-06-20 MED ORDER — LORAZEPAM 0.5 MG PO TABS
0.5000 mg | ORAL_TABLET | Freq: Four times a day (QID) | ORAL | Status: DC | PRN
  Administered 2021-06-20 (×2): 0.5 mg via ORAL
  Filled 2021-06-20 (×2): qty 1

## 2021-06-20 NOTE — Progress Notes (Addendum)
ANTICOAGULATION CONSULT NOTE - Initial Consult  Pharmacy Consult for warfarin Indication: atrial fibrillation  Allergies  Allergen Reactions   Amoxicillin     Other reaction(s): HIVES    Patient Measurements: Height: 6\' 2"  (188 cm) Weight: 86.7 kg (191 lb 2.2 oz) IBW/kg (Calculated) : 82.2  Vital Signs: Temp: 97.7 F (36.5 C) (11/15 1239) Temp Source: Oral (11/15 1239) BP: 122/55 (11/15 1239) Pulse Rate: 67 (11/15 1239)  Labs: Recent Labs    06/19/21 1526 06/20/21 0453  HGB 12.3*  --   HCT 37.1*  --   PLT 154  --   LABPROT 26.6* 25.0*  INR 2.5* 2.3*  CREATININE 1.04  --      Estimated Creatinine Clearance: 65.9 mL/min (by C-G formula based on SCr of 1.04 mg/dL).   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 M presents with AMS. Pt on warfarin PTA for afib. Admission INR 2.5 (therapeutic). CBC ok on admission. Home dose: 6mg  daily except for 4mg  on Mon and Fri  Goal of Therapy:  INR 2-3 Monitor platelets by anticoagulation protocol: Yes   Plan:  Daily PT/INR Warfarin 6mg  daily except for 4mg  on Mon and Fri INR daily   Thomasenia Sales, PharmD, [facility] Clinical Pharmacist  06/20/2021,12:46 PM

## 2021-06-20 NOTE — Chronic Care Management (AMB) (Signed)
Chronic Care Management    Clinical Social Work Note  06/20/2021 Name: Brett Pearson. MRN: [id number] DOB: Mar 28, 1941  Brett Pearson. is a 80 y.o. year old male who is a primary care patient of [name], [name], MD. The CCM team was consulted to assist the patient with chronic disease management and/or care coordination needs related to: [name] .   Engaged with patient /[name], Significant Other to client, by telephone for follow up visit in response to provider referral for social work chronic care management and care coordination services.   Consent to Services:  The patient was given information about Chronic Care Management services, agreed to services, and gave verbal consent prior to initiation of services.  Please see initial visit note for detailed documentation.   Patient agreed to services and consent obtained.   Assessment: Review of patient past medical history, allergies, medications, and health status, including review of relevant consultants reports was performed today as part of a comprehensive evaluation and provision of chronic care management and care coordination services.     SDOH (Social Determinants of Health) assessments and interventions performed:  SDOH Interventions    Flowsheet Row Most Recent Value  SDOH Interventions   Physical Activity Interventions --  [wheelchair dependent for ambulation]  Stress Interventions Other (Comment)  [client has stress related to managing medical needs]  Transportation Interventions Other (Comment)  [client is dependent on others for transport assistance]  Depression Interventions/Treatment  Counseling        Advanced Directives Status: See Vynca application for related entries.  CCM Care Plan  Allergies  Allergen Reactions   Amoxicillin     Other reaction(s): HIVES    Facility-Administered Encounter Medications as of 06/20/2021  Medication   acetaminophen (TYLENOL) tablet 650 mg   Or    acetaminophen (TYLENOL) suppository 650 mg   dextrose 5 %-0.9 % sodium chloride infusion   melatonin tablet 9 mg   metoprolol succinate (TOPROL-XL) 24 hr tablet 25 mg   ondansetron (ZOFRAN) tablet 4 mg   Or   ondansetron (ZOFRAN) injection 4 mg   polyethylene glycol (MIRALAX / GLYCOLAX) packet 17 g   [START ON 06/23/2021] warfarin (COUMADIN) tablet 4 mg   warfarin (COUMADIN) tablet 6 mg   Warfarin - Pharmacist Dosing Inpatient   Outpatient Encounter Medications as of 06/20/2021  Medication Sig Note   Ascorbic Acid (VITAMIN C) 1000 MG tablet Take 1,000 mg by mouth daily.    cholecalciferol (VITAMIN D) 1000 UNITS tablet Take 2,000 Units by mouth daily.    Cyanocobalamin (VITAMIN B-12 PO) Take by mouth daily.    donepezil (ARICEPT) 10 MG tablet Take 1 tablet (10 mg total) by mouth at bedtime. (Patient not taking: No sig reported)    doxycycline (VIBRA-TABS) 100 MG tablet Take 100 mg by mouth 2 (two) times daily. (Patient not taking: Reported on 06/19/2021)    gabapentin (NEURONTIN) 300 MG capsule Take 3 capsules (900 mg total) by mouth 2 (two) times daily.    Magnesium Gluconate 500 (27 Mg) MG TABS Take 500 mg by mouth. 07/11/2016: Received from: [facility] Received Sig: Take 500 mg by mouth.   Melatonin 10 MG TABS Take 1 tablet by mouth daily.    metoprolol succinate (TOPROL-XL) 25 MG 24 hr tablet Take 1 tablet (25 mg total) by mouth daily.    Multiple Vitamin (MULTIVITAMIN) tablet Take 1 tablet by mouth daily.    warfarin (COUMADIN) 2 MG tablet 6 mg on  every day except Mondays and Fridays, take 4 mg on Monday and Friday 06/19/2021: Pt was given only 2 mg today wife only had one tablet remaining, pt needs a second dose of 2 mg for a total of 4 mg.    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   Alzheimer's dementia ([place]) 08/03/2016   Vitamin B12 deficiency 02/13/2016   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/2015   Essential hypertension 07/13/2015   Obesity (BMI 30-39.9) 07/13/2015   Chronic atrial fibrillation ([place]) 11/06/2012    Conditions to be addressed/monitored: monitor client completion of ADLs  Care Plan : LCSW Care Plan  Updates made by [name], LCSW since 06/20/2021 12:00 AM     Problem: Coping Skills (General Plan of Care)      Goal: Coping Skills Enhanced: Complete ADLs daily, as able   Start Date: 06/19/2021  Expected End Date: 09/14/2021  This Visit's Progress: Not on track  Recent Progress: Not on track  Priority: High  Note:   Current barriers:   Patient in need of assistance with connecting to community resources for help in completing ADLs Patient is unable to independently navigate community resource options without care coordination support Mobility issues Memory issues  Clinical Goals:   Patient will communicate with LCSW in next 30 days to discuss ADLs completion of client Patient will communicate will RNCM as needed in next 30 days for nursing support Patient will cooperate with in home care support staff helping him weekly in the home as scheduled  Clinical Interventions:  Collaboration with [name], [name], MD regarding development and update of comprehensive plan of care as evidenced by provider attestation and co-signature Discussed with [facility] , Significant Other, current needs of client. She said  caring for needs of client has been difficult recently. She said that due to her own health needs, she can no longer care for client in the home environment. She has informed Dr. Bryon Lions that she can no longer care for client at home. Client has gone to [facility] for UTI.  She has informed hospital staff that she can no longer care for client at home.  She does have client on waiting list at [facility] and at [facility] .  [name] and LCSW discussed client needs, client incontinency issues, and client dependence on wheelchair for ambulation.  Client has ongoing memory issues, incontinency issues, and recently client has displayed behavioral issues. [name] has stated she can no longer care for client needs in the home. LCSW encouraged [name] to talk with Social Worker at [facility] to ensure Social Worker is aware of client needs and Carole's inability to care for him in the home environment. [name] and SW spoke of client placement. [name] has client on waiting list at [facility] in [place]. Client is also on waiting list at [facility] in [place], [place].  LCSW encouraged [name] to make sure she informs Education officer, museum at [employer]  [facility] of this information regarding client Provided counseling support for [facility] related to client needs Discussed self care for [facility]  Patient Coping Skills: Has support from [facility] Attends scheduled medical appointments  Patient Deficits:  Memory issues Mobility issues  Patient Goals: In next 30 days, client will Attend scheduled medical appointments Take medications as prescribed Talk regularly with [name] about client needs -  Follow Up Plan: LCSW to call [name] on 08/18/21 at 9:00 AM to discuss needs of client.       Norva Riffle.Aadit Hagood MSW, LCSW Licensed Clinical Social Worker [facility] Care Management [phone]

## 2021-06-20 NOTE — Patient Instructions (Addendum)
Visit Information  Patient Goals:  Manage Emotions: Complete ADLs daily as able  Timeframe:  Short-Term Goal Priority:  High  Progress: Not On Track Start Date:            06/19/21                 Expected End Date:          09/14/21            Follow Up Date  08/18/21 at 9:00 AM   Manage Emotions: Complete ADLs daily as able    Why is this important?   When you are stressed, down or upset, your body reacts too.  For example, your blood pressure may get higher; you may have a headache or stomachache.  When your emotions get the best of you, your body's ability to fight off cold and flu gets weak.  These steps will help you manage your emotions.     Patient Coping Skills: Has support from [facility] Attends scheduled medical appointments  Patient Deficits:  Memory issues Mobility issues  Patient Goals: In next 30 days, client will Attend scheduled medical appointments Take medications as prescribed Talk regularly with [name] about client needs -  Follow Up Plan:  LCSW to call [name] on 08/18/21 at 9:00 AM to discuss needs of client.    Norva Riffle.Kamyah Wilhelmsen MSW, LCSW Licensed Clinical Social Worker [facility] Care Management [phone]

## 2021-06-20 NOTE — Progress Notes (Signed)
PROGRESS NOTE   Brett Pearson.  DQQ:[id number] DOB: [birth year] DOA: 06/19/2021 PCP: [name], [name], MD   Chief Complaint  Patient presents with   Altered Mental Status   Level of care: Med-Surg  Brief Admission History:  80 y.o. male with medical history significant for dementia, atrial fibrillation, cirrhosis, hypertension, interstitial lung disease, right BKA.  He was brought in to the emergency department due to progressive encephalopathy.  He has been difficult to manage at home.  He has been falling down.  His wife is worried about a UTI.  He has been refusing care at times and difficulty performing ADLs.  Wife reports she cannot care for patient at home and it is not safe for him to be there because she is not able to properly care for him.  He has severe advanced dementia.     Assessment & Plan:   Principal Problem:   Acute metabolic encephalopathy Active Problems:   Chronic atrial fibrillation (HCC)   Essential hypertension   Alzheimer's dementia ([place])   Cryptogenic cirrhosis (HCC)   Urinary incontinence   Falls frequently   Acute encephalopathy  - I worry that this is likely due to advancing dementia - no metabolic causes could be found  - he remains unsafe to discharge back home after discussion with wife - I have consulted social worker / [facility] team to assist with safe disposition  Alzheimer's Dementia  -Patient appears to be entering and stages of disease -palliative care consultation for goals of care discussion with family  Essential hypertension  - resume home meds if able, at this time BP is soft and home metoprolol was held today - follow   Chronic atrial fibrillation -Patient is fully anticoagulated with warfarin therapeutic INR 2.5 -Pharm.D. consulted for warfarin dosing and management while in the hospital -Palliative discussions needed to discuss risks versus benefits of continuing full anticoagulation given high fall risk -Metoprolol as ordered  for heart rate control (will add holding parameters based on blood pressure and heart rate)  Cryptogenic cirrhosis -His ammonia level was less than 10 which is reassuring  Frequent Falls -This is a more complex problem given his advanced dementia and full anticoagulation on warfarin - palliative talks needed for goals of care discussions   Social Update  - TOC/social worker informs me that after investigation they determined it is unsafe to discharge patient home as wife no longer able to care for him for many reasons.   - TOC team working on skilled nursing placement     DVT prophylaxis: warfarin (INR 2.4) Code Status: full  Family Communication: wife  Disposition: anticipate SNF placement  Status is: Inpatient  Remains inpatient appropriate because: UNSAFE TO DISCHARGE HOME   Consultants:  PT TOC Pharm D (warfarin)  Procedures:  N/a   Antimicrobials:  N/a    Subjective: Pt remains pleasantly confused.  No specific complaints.  He is not verbalizing any discomfort.   Objective: Vitals:   06/20/21 0611 06/20/21 0825 06/20/21 1238 06/20/21 1239  BP: 122/66 (!) 98/59 (!) 122/55 (!) 122/55  Pulse: (!) 58 64 82 67  Resp: 18  18 18   Temp: 98.1 F (36.7 C)  97.7 F (36.5 C) 97.7 F (36.5 C)  TempSrc: Oral  Oral Oral  SpO2: 99% 100% 100% 100%  Weight:      Height:        Intake/Output Summary (Last 24 hours) at 06/20/2021 1455 Last data filed at 06/20/2021 1239 Gross per 24 hour  Intake 1717.39 ml  Output 1 ml  Net 1716.39 ml   Filed Weights   06/19/21 1422 06/19/21 2200  Weight: 92.1 kg 86.7 kg    Examination:  General exam: elderly male lying in bed, awake, with advanced dementia.  Appears calm and comfortable  Respiratory system: Clear to auscultation. Respiratory effort normal. Cardiovascular system: normal S1 & S2 heard.  trace pedal edema. Gastrointestinal system: Abdomen is nondistended, soft and nontender. No organomegaly or masses felt. Normal  bowel sounds heard. Central nervous system: Alert and oriented. No focal neurological deficits. Extremities: Symmetric 5 x 5 power. Skin: No rashes, lesions or ulcers Psychiatry: Judgement and insight appear poor. Mood & affect seems flat today.   Data Reviewed: I have personally reviewed following labs and imaging studies  CBC: Recent Labs  Lab 06/19/21 1526  WBC 6.7  NEUTROABS 4.4  HGB 12.3*  HCT 37.1*  MCV 101.4*  PLT 387    Basic Metabolic Panel: Recent Labs  Lab 06/19/21 1526  NA 137  K 4.3  CL 104  CO2 30  GLUCOSE 60*  BUN 16  CREATININE 1.04  CALCIUM 8.8*    GFR: Estimated Creatinine Clearance: 65.9 mL/min (by C-G formula based on SCr of 1.04 mg/dL).  Liver Function Tests: Recent Labs  Lab 06/19/21 1526  AST 19  ALT 12  ALKPHOS 67  BILITOT 0.9  PROT 6.7  ALBUMIN 3.3*    CBG: Recent Labs  Lab 06/19/21 1950  GLUCAP 81    Recent Results (from the past 240 hour(s))  Resp Panel by RT-PCR (Flu A&B, Covid) Nasopharyngeal Swab     Status: None   Collection Time: 06/19/21  3:23 PM   Specimen: Nasopharyngeal Swab; Nasopharyngeal(NP) swabs in vial transport medium  Result Value Ref Range Status   SARS Coronavirus 2 by RT PCR NEGATIVE NEGATIVE Final    Comment: (NOTE) SARS-CoV-2 target nucleic acids are NOT DETECTED.  The SARS-CoV-2 RNA is generally detectable in upper respiratory specimens during the acute phase of infection. The lowest concentration of SARS-CoV-2 viral copies this assay can detect is 138 copies/mL. A negative result does not preclude SARS-Cov-2 infection and should not be used as the sole basis for treatment or other patient management decisions. A negative result may occur with  improper specimen collection/handling, submission of specimen other than nasopharyngeal swab, presence of viral mutation(s) within the areas targeted by this assay, and inadequate number of viral copies(<138 copies/mL). A negative result must be combined  with clinical observations, patient history, and epidemiological information. The expected result is Negative.  Fact Sheet for Patients:  EntrepreneurPulse.com.au  Fact Sheet for Healthcare Providers:  IncredibleEmployment.be  This test is no t yet approved or cleared by the [place] FDA and  has been authorized for detection and/or diagnosis of SARS-CoV-2 by FDA under an Emergency Use Authorization (EUA). This EUA will remain  in effect (meaning this test can be used) for the duration of the COVID-19 declaration under Section 564(b)(1) of the Act, 21 U.S.C.section 360bbb-3(b)(1), unless the authorization is terminated  or revoked sooner.       Influenza A by PCR NEGATIVE NEGATIVE Final   Influenza B by PCR NEGATIVE NEGATIVE Final    Comment: (NOTE) The Xpert Xpress SARS-CoV-2/FLU/RSV plus assay is intended as an aid in the diagnosis of influenza from Nasopharyngeal swab specimens and should not be used as a sole basis for treatment. Nasal washings and aspirates are unacceptable for Xpert Xpress SARS-CoV-2/FLU/RSV testing.  Fact Sheet for Patients: EntrepreneurPulse.com.au  Fact Sheet for Healthcare Providers: IncredibleEmployment.be  This test is not yet approved or cleared by the [place] FDA and has been authorized for detection and/or diagnosis of SARS-CoV-2 by FDA under an Emergency Use Authorization (EUA). This EUA will remain in effect (meaning this test can be used) for the duration of the COVID-19 declaration under Section 564(b)(1) of the Act, 21 U.S.C. section 360bbb-3(b)(1), unless the authorization is terminated or revoked.  Performed at [facility], [address] [place], [place] [address]      Radiology Studies: DG Chest 2 View  Result Date: 06/19/2021 CLINICAL DATA:  Altered mental status EXAM: CHEST - 2 VIEW COMPARISON:  Previous studies including the examination of  07/31/2018 FINDINGS: Transverse diameter of heart is increased. Apparent shift of mediastinum to the right may be due to rotation. There are no signs of alveolar pulmonary edema. Linear densities in the left lower lung fields have not changed significantly, possibly scarring. There is no focal pulmonary consolidation. There is no significant pleural effusion or pneumothorax. There is a linear high density along the right margin of heart, possibly pericardial calcification which has not changed significantly. Colon appears to be interposed between the liver and anterior abdominal wall in the right subdiaphragmatic location. IMPRESSION: Cardiomegaly. There are no signs of pulmonary edema or focal pulmonary consolidation. Linear densities in the left lower lung fields have not changed significantly, possibly suggesting scarring. Electronically Signed   By: Elmer Picker M.D.   On: 06/19/2021 16:54   CT Head Wo Contrast  Result Date: 06/19/2021 CLINICAL DATA:  Mental status change. EXAM: CT HEAD WITHOUT CONTRAST TECHNIQUE: Contiguous axial images were obtained from the base of the skull through the vertex without intravenous contrast. COMPARISON:  None. BRAIN: BRAIN Cerebral ventricle sizes are concordant with the degree of cerebral volume loss. Patchy and confluent areas of decreased attenuation are noted throughout the deep and periventricular white matter of the cerebral hemispheres bilaterally, compatible with chronic microvascular ischemic disease. No evidence of large-territorial acute infarction. No parenchymal hemorrhage. No mass lesion. No extra-axial collection. No mass effect or midline shift. No hydrocephalus. Basilar cisterns are patent. Vascular: No hyperdense vessel. Atherosclerotic calcifications are present within the cavernous internal carotid arteries. Skull: No acute fracture or focal lesion. Sinuses/Orbits: Paranasal sinuses and mastoid air cells are clear. Likely bilateral lens  replacement. Otherwise orbits are unremarkable. Other: None. IMPRESSION: No acute intracranial abnormality. Electronically Signed   By: Iven Finn M.D.   On: 06/19/2021 16:52    Scheduled Meds:  melatonin  9 mg Oral Daily   metoprolol succinate  25 mg Oral Daily   [START ON 06/23/2021] warfarin  4 mg Oral Once per day on Mon Fri   warfarin  6 mg Oral Once per day on Sun Tue Wed Thu Sat   Warfarin - Pharmacist Dosing Inpatient   Does not apply q1600   Continuous Infusions:   LOS: 0 days   Time spent: 30 minutes   Nickolis Diel Wynetta Emery, MD How to contact the [facility] Attending or Consulting provider [place] or covering provider during after hours [place], for this patient?  Check the care team in [facility] and look for a) attending/consulting [facility] provider listed and b) the [facility] team listed Log into [email or web address] and use [place]'s universal password to access. If you do not have the password, please contact the hospital operator. Locate the [facility] provider you are looking for under [facility] and page to a number that you can be directly  reached. If you still have difficulty reaching the provider, please page the [facility] (Director on Call) for the Hospitalists listed on amion for assistance.  06/20/2021, 2:55 PM

## 2021-06-20 NOTE — Plan of Care (Signed)
  Problem: Acute Rehab PT Goals(only PT should resolve) Goal: Pt Will Go Supine/Side To Sit Outcome: Progressing Flowsheets (Taken 06/20/2021 1103) Pt will go Supine/Side to Sit: with min guard assist Goal: Patient Will Transfer Sit To/From Stand Outcome: Progressing Flowsheets (Taken 06/20/2021 1103) Patient will transfer sit to/from stand: with minimal assist Goal: Pt Will Transfer Bed To Chair/Chair To Bed Outcome: Progressing Flowsheets (Taken 06/20/2021 1103) Pt will Transfer Bed to Chair/Chair to Bed: with min assist Goal: Pt Will Ambulate Outcome: Progressing Flowsheets (Taken 06/20/2021 1103) Pt will Ambulate:  25 feet  with minimal assist  with +2  with rolling walker Goal: Pt/caregiver will Perform Home Exercise Program Outcome: Progressing Flowsheets (Taken 06/20/2021 1103) Pt/caregiver will Perform Home Exercise Program:  For increased ROM  For increased strengthening  For improved balance  With Supervision, verbal cues required/provided   Talbot Grumbling PT, DPT 06/20/21, 11:04 AM

## 2021-06-20 NOTE — Evaluation (Signed)
Physical Therapy Evaluation Patient Details Name: Brett Pearson. MRN: [id number] DOB: [birth year] Today's Date: 06/20/2021  History of Present Illness  Brett Pearson. is an 80 y.o. year old male who presents with AMS. Head CT and chest xray negative for acute findings. PMH: afib, neuopathy, R BKA with prosthesis 2009  Clinical Impression  Pt admitted with above diagnosis. No family present, pt poor historian; per chart review pt using w/c and assisted by spouse but now requiring too much assist for spouse to handle. Pt currently needing min A to come to sitting EOB with use of bedpad, heavy VC for sequencing and hand placement. Pt transfers to [facility] with min A+2 for safety, heavy cues and assist with RW, no R prosthesis but able to pivot on LLE with assist to steady. Pt pleasant and confused, requires redirection to task at hand and cues for mobility. Pt left on BSC with NT in room. Pt is a high fall risk without RLE prosthesis, also requiring +2 assist with transfers for safety on eval, recommend SNF prior to return home for strengthening. Pt currently with functional limitations due to the deficits listed below (see PT Problem List). Pt will benefit from skilled PT to increase their independence and safety with mobility to allow discharge to the venue listed below.          Recommendations for follow up therapy are one component of a multi-disciplinary discharge planning process, led by the attending physician.  Recommendations may be updated based on patient status, additional functional criteria and insurance authorization.  Follow Up Recommendations Skilled nursing-short term rehab (<3 hours/day)    Assistance Recommended at Discharge    Functional Status Assessment    Equipment Recommendations  Other (comment) (defer to next venue)    Recommendations for Other Services       Precautions / Restrictions Precautions Precautions: Fall Precaution Comments: R BKA without prosthesis in  room Restrictions Weight Bearing Restrictions: No      Mobility  Bed Mobility Overal bed mobility: Needs Assistance Bed Mobility: Supine to Sit  Supine to sit: Min assist;HOB elevated  General bed mobility comments: min A to upright trunk and scoot out to EOB with bed pad, heavy VC for hand placement and sequencing to come to EOB    Transfers Overall transfer level: Needs assistance Equipment used: Rolling walker (2 wheels) Transfers: Sit to/from Stand;Bed to chair/wheelchair/BSC Sit to Stand: Min assist;+2 safety/equipment;From elevated surface Stand pivot transfers: Min assist;+2 safety/equipment  General transfer comment: min A+2 for safety with STS from elevated bed and with pivot to [facility], heavy cues for hand placement and steadying assist, increased time    Ambulation/Gait  General Gait Details: not attempted, no RLE prosthesis in room  Stairs            Wheelchair Mobility    Modified Rankin (Stroke Patients Only)       Balance Overall balance assessment: Needs assistance Sitting-balance support: Feet supported;Single extremity supported Sitting balance-Leahy Scale: Fair Sitting balance - Comments: seated EOB   Standing balance support: During functional activity;Bilateral upper extremity supported;Reliant on assistive device for balance Standing balance-Leahy Scale: Poor       Pertinent Vitals/Pain Pain Assessment: No/denies pain    Home Living Family/patient expects to be discharged to:: Skilled nursing facility  Additional Comments: Per chart review, pt from home with spouse and she is no longer able to assist him.    Prior Function Prior Level of Function : Patient poor historian/Family not  available  Mobility Comments: per chart review, pt using w/c at home and spouse assisting as needed ADLs Comments: per chart review, spouse assisting as needed     Hand Dominance        Extremity/Trunk Assessment   Upper Extremity Assessment Upper  Extremity Assessment: Defer to OT evaluation    Lower Extremity Assessment Lower Extremity Assessment: RLE deficits/detail;LLE deficits/detail RLE Deficits / Details: R BKA, hip and knee AROM WNL, strength grossly 3/5 RLE Sensation: WNL RLE Coordination: WNL LLE Deficits / Details: AROM WNL, strength grossly 4-/5 throughout LLE Sensation: WNL LLE Coordination: WNL    Cervical / Trunk Assessment Cervical / Trunk Assessment: Kyphotic  Communication   Communication: No difficulties  Cognition Arousal/Alertness: Awake/alert Behavior During Therapy: WFL for tasks assessed/performed Overall Cognitive Status: History of cognitive impairments - at baseline  General Comments: pt states name appropriately, unable to state location, situation, requires increased cues and times with single step commands        General Comments      Exercises     Assessment/Plan    PT Assessment Patient needs continued PT services  PT Problem List Decreased strength;Decreased activity tolerance;Decreased balance;Decreased mobility;Decreased cognition;Decreased knowledge of use of DME;Decreased safety awareness       PT Treatment Interventions DME instruction;Gait training;Functional mobility training;Therapeutic exercise;Balance training;Cognitive remediation;Therapeutic activities;Patient/family education    PT Goals (Current goals can be found in the Care Plan section)  Acute Rehab PT Goals Patient Stated Goal: go to the bathroom PT Goal Formulation: With patient Time For Goal Achievement: 07/04/21 Potential to Achieve Goals: Fair    Frequency Min 2X/week   Barriers to discharge        Co-evaluation               AM-PAC PT "6 Clicks" Mobility  Outcome Measure Help needed turning from your back to your side while in a flat bed without using bedrails?: A Little Help needed moving from lying on your back to sitting on the side of a flat bed without using bedrails?: A Little Help  needed moving to and from a bed to a chair (including a wheelchair)?: Total Help needed standing up from a chair using your arms (e.g., wheelchair or bedside chair)?: Total Help needed to walk in hospital room?: Total Help needed climbing 3-5 steps with a railing? : Total 6 Click Score: 10    End of Session Equipment Utilized During Treatment: Gait belt Activity Tolerance: Patient tolerated treatment well Patient left: with nursing/sitter in room (on [facility] with NT in room) Nurse Communication: Mobility status PT Visit Diagnosis: Unsteadiness on feet (R26.81);Other abnormalities of gait and mobility (R26.89);Muscle weakness (generalized) (M62.81)    Time: 8416-6063 PT Time Calculation (min) (ACUTE ONLY): 27 min   Charges:   PT Evaluation $PT Eval Moderate Complexity: 1 Mod           Tori Grier Vu PT, DPT 06/20/21, 11:00 AM

## 2021-06-20 NOTE — TOC Initial Note (Signed)
Transition of Care (TOC) - Initial/Assessment Note    Patient Details  Name: Brett Pearson. MRN: [id number] Date of Birth: 1941/08/03  Transition of Care [facility]) CM/SW Contact:    Brett Lucks, RN Phone Number: 06/20/2021, 2:34 PM  Clinical Narrative:        Patient admitted with acute metabolic encephalopathy.  PT is recommending SNF. TOC spoke to his wife/ sign other they have been together 25 years. She is agreeable to SNF. [name] is their first choice. Brett Pearson state his PCP sent an FL2 to [facility] as well for rehab.  Brett Pearson state he did really well with HHPT when they was coming. She knows he will do well there he is a people person.   He has a walker and prosthetics he can use and she believes he can improve. She is looking for Short term rehab. TOC will sent new FL2 out for bed offers and start INS AUTH.  Expected Discharge Plan: Skilled Nursing Facility Barriers to Discharge: Continued Medical Work up   Patient Goals and CMS Choice Patient states their goals for this hospitalization and ongoing recovery are:: agreeable to SNF CMS Medicare.gov Compare Post Acute Care list provided to:: Patient Represenative (must comment) Choice offered to / list presented to : Spouse  Expected Discharge Plan and Services Expected Discharge Plan: [place] arrangements for the past 2 months: Single Family Home                   Prior Living Arrangements/Services Living arrangements for the past 2 months: Single Family Home Lives with:: Spouse Patient language and need for interpreter reviewed:: Yes Do you feel safe going back to the place where you live?: Yes          Current home services: DME Criminal Activity/Legal Involvement Pertinent to Current Situation/Hospitalization: No - Comment as needed  Activities of Daily Living Home Assistive Devices/Equipment: Wheelchair, Radio producer (specify quad or straight) ADL Screening (condition at time of  admission) Patient's cognitive ability adequate to safely complete daily activities?: No Is the patient deaf or have difficulty hearing?: No Does the patient have difficulty seeing, even when wearing glasses/contacts?: No Does the patient have difficulty concentrating, remembering, or making decisions?: Yes Patient able to express need for assistance with ADLs?: No Does the patient have difficulty dressing or bathing?: Yes Independently performs ADLs?: No Communication: Independent Dressing (OT): Needs assistance Is this a change from baseline?: Pre-admission baseline Grooming: Needs assistance Is this a change from baseline?: Pre-admission baseline Feeding: Independent Bathing: Needs assistance Is this a change from baseline?: Pre-admission baseline Toileting: Needs assistance Is this a change from baseline?: Pre-admission baseline In/Out Bed: Needs assistance Is this a change from baseline?: Pre-admission baseline Walks in Home: Needs assistance Is this a change from baseline?: Pre-admission baseline Does the patient have difficulty walking or climbing stairs?: Yes Weakness of Legs: Both Weakness of Arms/Hands: None  Permission Sought/Granted      Share Information with NAME: Brett Pearson    Emotional Assessment     Affect (typically observed): Accepting Orientation: : Oriented to Self, Oriented to Situation, Oriented to Place Alcohol / Substance Use: Not Applicable Psych Involvement: No (comment)  Admission diagnosis:  Confusion [R41.0] Weakness [Y19.5] Acute metabolic encephalopathy [K93.26] Falls frequently [R29.6] Patient Active Problem List   Diagnosis Date Noted   Falls frequently 71/24/5809   Acute metabolic encephalopathy 98/33/8250   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   Urinary incontinence 07/31/2018  Esophageal dysmotility 04/17/2018   Cryptogenic cirrhosis ([place]) 03/20/2018   Gastroesophageal reflux disease without esophagitis  03/20/2018   History of colon polyps 03/20/2018   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   Vitamin D deficiency 10/14/2017   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

## 2021-06-20 NOTE — NC FL2 (Signed)
[place] LEVEL OF CARE SCREENING TOOL     IDENTIFICATION  Patient Name: Brett Pearson. Birthdate: August 12, 1940 Sex: male Admission Date (Current Location): 06/19/2021  [facility] and [place] Number:  [employer] and Address:  [facility] [address] [place]      Provider Number: [id number]  Attending Physician Name and Address:  Murlean Iba, MD  Relative Name and Phone Number:  [name] Significant Other [phone]    Current Level of Care: Hospital Recommended Level of Care: [place] Prior Approval Number:    Date Approved/Denied:   PASRR Number: [id number]  Discharge Plan: SNF    Current Diagnoses: Patient Active Problem List   Diagnosis Date Noted   Falls frequently 20/94/7096   Acute metabolic encephalopathy 28/36/6294   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   Urinary incontinence 07/31/2018   Esophageal dysmotility 04/17/2018   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   Gastroesophageal reflux disease without esophagitis 03/20/2018   History of colon polyps 03/20/2018   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   Vitamin D deficiency 10/14/2017   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   Interstitial lung disease ([place]) 02/08/2017   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   Alzheimer's dementia ([place]) 08/03/2016   Vitamin B12 deficiency 02/13/2016   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 ([place]) 07/27/2015   Essential hypertension 07/13/2015   Obesity (BMI 30-39.9) 07/13/2015   Chronic atrial fibrillation ([place]) 11/06/2012    Orientation RESPIRATION BLADDER Height & Weight     Self, Time, Situation, Place  Normal External catheter Weight: 86.7 kg Height:  6\' 2"  (188 cm)  BEHAVIORAL SYMPTOMS/MOOD NEUROLOGICAL BOWEL NUTRITION STATUS      Continent Diet (See DC summary)  AMBULATORY STATUS COMMUNICATION OF NEEDS Skin    Extensive Assist Verbally Normal                       Personal Care Assistance Level of Assistance  Bathing, Feeding, Dressing Bathing Assistance: Maximum assistance Feeding assistance: Independent Dressing Assistance: Maximum assistance     Functional Limitations Info  Sight, Hearing, Speech Sight Info: Adequate Hearing Info: Adequate Speech Info: Adequate    SPECIAL CARE FACTORS FREQUENCY  PT (By licensed PT)     PT Frequency: 5 times a week              Contractures Contractures Info: Not present    Additional Factors Info  Code Status, Allergies Code Status Info: FULL Allergies Info: Amoxicillin           Current Medications (06/20/2021):  This is the current hospital active medication list Current Facility-Administered Medications  Medication Dose Route Frequency Provider Last Rate Last Admin   acetaminophen (TYLENOL) tablet 650 mg  650 mg Oral Q6H PRN Emokpae, Ejiroghene E, MD       Or   acetaminophen (TYLENOL) suppository 650 mg  650 mg Rectal Q6H PRN Emokpae, Ejiroghene E, MD       LORazepam (ATIVAN) tablet 0.5 mg  0.5 mg Oral Q6H PRN Johnson, Clanford L, MD       melatonin tablet 9 mg  9 mg Oral Daily Emokpae, Ejiroghene E, MD   9 mg at 06/20/21 0828   metoprolol succinate (TOPROL-XL) 24 hr tablet 25 mg  25 mg Oral Daily Johnson, Clanford L, MD       ondansetron (ZOFRAN) tablet 4 mg  4 mg Oral Q6H PRN  Emokpae, Ejiroghene E, MD       Or   ondansetron (ZOFRAN) injection 4 mg  4 mg Intravenous Q6H PRN Emokpae, Ejiroghene E, MD       polyethylene glycol (MIRALAX / GLYCOLAX) packet 17 g  17 g Oral Daily PRN Emokpae, Ejiroghene E, MD       [START ON 06/23/2021] warfarin (COUMADIN) tablet 4 mg  4 mg Oral Once per day on Mon Fri Amend, Caron G, [facility]       warfarin (COUMADIN) tablet 6 mg  6 mg Oral Once per day on Sun Tue Wed Thu Sat Amend, Caron G, [facility]       Warfarin - Pharmacist Dosing Inpatient   Does not apply Boaz, [facility]          Discharge Medications: Please see discharge summary for a list of discharge medications.  Relevant Imaging Results:  Relevant Lab Results:   Additional Information SS# [id number]  Boneta Lucks, RN

## 2021-06-21 ENCOUNTER — Encounter (HOSPITAL_COMMUNITY): Payer: Self-pay | Admitting: Family Medicine

## 2021-06-21 DIAGNOSIS — Z515 Encounter for palliative care: Secondary | ICD-10-CM

## 2021-06-21 DIAGNOSIS — Z7189 Other specified counseling: Secondary | ICD-10-CM

## 2021-06-21 DIAGNOSIS — F0280 Dementia in other diseases classified elsewhere without behavioral disturbance: Secondary | ICD-10-CM

## 2021-06-21 LAB — TSH: TSH: 5.902 u[IU]/mL — ABNORMAL HIGH (ref 0.350–4.500)

## 2021-06-21 LAB — PROTIME-INR
INR: 2.2 — ABNORMAL HIGH (ref 0.8–1.2)
Prothrombin Time: 24.6 seconds — ABNORMAL HIGH (ref 11.4–15.2)

## 2021-06-21 LAB — VITAMIN B12: Vitamin B-12: 554 pg/mL (ref 180–914)

## 2021-06-21 LAB — T4, FREE: Free T4: 1 ng/dL (ref 0.61–1.12)

## 2021-06-21 LAB — FOLATE: Folate: 22.7 ng/mL (ref >5.9)

## 2021-06-21 NOTE — Progress Notes (Signed)
ANTICOAGULATION CONSULT NOTE -   Pharmacy Consult for warfarin Indication: atrial fibrillation  Allergies  Allergen Reactions   Amoxicillin     Other reaction(s): HIVES    Patient Measurements: Height: 6\' 2"  (188 cm) Weight: 86.7 kg (191 lb 2.2 oz) IBW/kg (Calculated) : 82.2  Vital Signs: BP: 129/79 (11/16 0852) Pulse Rate: 67 (11/16 0852)  Labs: Recent Labs    06/19/21 1526 06/20/21 0453 06/21/21 0521  HGB 12.3*  --   --   HCT 37.1*  --   --   PLT 154  --   --   LABPROT 26.6* 25.0* 24.6*  INR 2.5* 2.3* 2.2*  CREATININE 1.04  --   --      Estimated Creatinine Clearance: 65.9 mL/min (by C-G formula based on SCr of 1.04 mg/dL).   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 M presents with AMS. Pt on warfarin PTA for afib. Admission INR 2.5 (therapeutic). CBC ok on admission. Home dose: 6mg  daily except for 4mg  on Mon and Fri  INR 2.5 > 2.3 > 2.2  Goal of Therapy:  INR 2-3 Monitor platelets by anticoagulation protocol: Yes   Plan:  Warfarin 6mg  daily X 1 dose Daily INR  Margot Ables, PharmD Clinical Pharmacist 06/21/2021 10:51 AM

## 2021-06-21 NOTE — Consult Note (Signed)
Consultation Note Date: 06/21/2021   Patient Name: Brett Pearson.  DOB: Jul 24, 1941  MRN: [id number]  Age / Sex: 80 y.o., male  PCP: [name], [name], MD Referring Physician: Orson Eva, MD  Reason for Consultation: Establishing goals of care  HPI/Patient Profile: 80 y.o. male  with past medical history of dementia, A. fib, cirrhosis, HTN/HLD, interstitial lung disease, right BKA, former smoker admitted on 06/19/2021 with acute metabolic encephalopathy with baseline's dementia, UA not suggestive of UTI, cryptogenic liver cirrhosis appears stable.   Clinical Assessment and Goals of Care: I have reviewed medical records including EPIC notes, labs and imaging, received report from RN, assessed the patient.  Brett Pearson is lying quietly in bed.  He appears acutely/chronically ill and somewhat frail.  He has temporal wasting.  He is alert, but unable to tell me his name.  I do not believe that he can make his basic needs known.  There is no family at bedside at this time.    Call to significant other, Brett Pearson at home [phone], unable to leave voicemail message.  No other number available.   Return call later in the day to significant other of 25 years, Brett Pearson at corrected phone number, to discuss diagnosis prognosis, Brett Pearson, EOL wishes, disposition and options.  I introduced Palliative Medicine as specialized medical care for people living with serious illness. It focuses on providing relief from the symptoms and stress of a serious illness. The goal is to improve quality of life for both the patient and the family.  We discussed a brief life review of the patient.  Brett Pearson states that they have been together 25 years.  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  Brett Pearson shares that they had "Common-law marriage" in [place], she knows this is not valid in [place].  She shares that he has 2 adopted  children.  She tells me that she has no real support in caring for Brett Pearson at this time.  Brett Pearson shares that Brett Pearson was showing signs of memory loss about 10 years ago, and officially diagnosed about 1 year ago.    Brett Pearson shares that he has continued to lose weight from 300 pounds to now 200 pounds over the last year.  She talks about his swallowing issues "choking", poor appetite, inability to share what he would like to eat.  We then focused on their current illness.  We talked about his frailty, weight loss, failure to thrive.  We talked about short-term rehab.  At this point it would seem that Brett Pearson would need long-term care.  The natural disease trajectory and expectations at EOL were discussed.  I attempted to elicit values and goals of care important to the patient.  Brett Pearson shares that Brett Pearson is very egocentric.  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  Advanced directives, concepts specific to code status, artifical feeding and hydration, and rehospitalization were considered and discussed.  Brett Pearson tells me  that Brett Pearson has a "living will".  She tells me that she has been keeping this paperwork nearby, but is unable to tell me what Brett Pearson would and would not want.  I share that most living will documents allow for more comfort care measures when a person has a terminal and incurable disease.  I share that dementia is a terminal and incurable disease to which Brett Pearson agrees.  She continually states that she has moral distress and changing any CODE STATUS for Brett Pearson.  I encouraged her to review Brett Pearson's living will, and she shares she will bring this document for nursing staff to copy and placed on chart.  Palliative Care services outpatient were explained and offered, to continue goals of care and CODE STATUS discussions.  Brett Pearson continues to talk about her moral distress and making choices for Brett Pearson, or changing any plan of care.  She tells me, "I can't face God  with that".  Discussed the importance of continued conversation with family and the medical providers regarding overall plan of care and treatment options, ensuring decisions are within the context of the patient's values and GOCs.  Questions and concerns were addressed.  The family was encouraged to call with questions or concerns.  PMT will continue to support holistically.  Conference with attending, bedside nursing staff, transition of care team related to patient condition, needs, goals of care, disposition.    HCPOA   HCPOA -significant other of 25 years, Brett Pearson Brett Pearson    SUMMARY OF RECOMMENDATIONS   At this point continue full scope/full code Seeking short-term rehab, anticipate need for long-term care Declines outpatient palliative services Healthcare power of attorney would benefit from further CODE STATUS discussions  Code Status/Advance Care Planning: Full code  Symptom Management:  Per hospitalist, no additional needs at this time.  Palliative Prophylaxis:  Oral Care and Turn Reposition  Additional Recommendations (Limitations, Scope, Preferences): At this point treat the treatable, needs CODE STATUS discussions  Psycho-social/Spiritual:  Desire for further Chaplaincy support:no Additional Recommendations: Caregiving  Support/Resources and Education on Hospice  Prognosis:  Unable to determine, based on outcomes.  6 months or less would not be surprising based on advancing dementia, chronic illness burden, frailty.  Discharge Planning:  Attempting short-term rehab, would benefit from outpatient palliative services.       Primary Diagnoses: Present on Admission:  Acute metabolic encephalopathy  Chronic atrial fibrillation (HCC)  Cryptogenic cirrhosis (HCC)  Alzheimer's dementia ([place])  Essential hypertension  Urinary incontinence   I have reviewed the medical record, interviewed the patient and family, and examined the patient. The following aspects are  pertinent.  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    Venous insufficiency    Social History   Socioeconomic History   Marital status: Married    Spouse name: [name]   Number of children: 2   Years of education: 16   Highest education level: Master's degree (e.g., MA, MS, MEng, MEd, MSW, MBA)  Occupational History   Occupation: Retired  Tobacco Use   Smoking status: Former    Types: Cigarettes    Quit date: 06/15/1959    Years since quitting: 62.0   Smokeless tobacco: Never  Vaping Use   Vaping Use: Never used  Substance and Sexual Activity   Alcohol use: No    Alcohol/week: 0.0 standard drinks  Drug use: No   Sexual activity: Not Currently  Other Topics Concern   Not on file  Social History Narrative   Lives with wife.    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     Is having increased confusion, memory loss - wife's health is also declining - she is looking to find some assistance with his ADLs       Social Determinants of Health   Financial Resource Strain: Low Risk    Difficulty of Paying Living Expenses: Not hard at all  Food Insecurity: No Food Insecurity   Worried About Charity fundraiser in the Last Year: Never true   Ran Out of Food in the Last Year: Never true  Transportation Needs: Unmet Transportation Needs   Lack of Transportation (Medical): No   Lack of Transportation (Non-Medical): Yes  Physical Activity: Inactive   Days of Exercise per Week: 0 days   Minutes of Exercise per Session: 0 min  Stress: Stress Concern Present   Feeling of Stress : To some extent  Social Connections: Moderately Integrated   Frequency of Communication with Friends and Family: Once a week   Frequency of Social Gatherings with Friends and Family: Once a week   Attends Religious Services: 1 to 4 times per year   Active Member of [employer] or Organizations: Yes    Attends Archivist Meetings: 1 to 4 times per year   Marital Status: Married   Family History  Problem Relation Age of Onset   Stroke Mother    Parkinson's disease Father    Scheduled Meds:  melatonin  9 mg Oral Daily   metoprolol succinate  25 mg Oral Daily   [START ON 06/23/2021] warfarin  4 mg Oral Once per day on Mon Fri   warfarin  6 mg Oral Once per day on Sun Tue Wed Thu Sat   Warfarin - Pharmacist Dosing Inpatient   Does not apply q1600   Continuous Infusions: PRN Meds:.acetaminophen **OR** acetaminophen, LORazepam, ondansetron **OR** ondansetron (ZOFRAN) IV, polyethylene glycol Medications Prior to Admission:  Prior to Admission medications   Medication Sig Start Date End Date Taking? Authorizing Provider  Ascorbic Acid (VITAMIN C) 1000 MG tablet Take 1,000 mg by mouth daily.   Yes [provider]  cholecalciferol (VITAMIN D) 1000 UNITS tablet Take 2,000 Units by mouth daily.   Yes [provider]  Cyanocobalamin (VITAMIN B-12 PO) Take by mouth daily.   Yes [provider]  gabapentin (NEURONTIN) 300 MG capsule Take 3 capsules (900 mg total) by mouth 2 (two) times daily. 10/05/20  Yes [name], [name], MD  Magnesium Gluconate 500 (27 Mg) MG TABS Take 500 mg by mouth.   Yes [provider]  Melatonin 10 MG TABS Take 1 tablet by mouth daily.   Yes [provider]  metoprolol succinate (TOPROL-XL) 25 MG 24 hr tablet Take 1 tablet (25 mg total) by mouth daily. 05/31/21  Yes [name], [name], MD  Multiple Vitamin (MULTIVITAMIN) tablet Take 1 tablet by mouth daily.   Yes [provider]  warfarin (COUMADIN) 2 MG tablet 6 mg on every day except Mondays and Fridays, take 4 mg on Monday and Friday 05/31/21  Yes [name], [name], MD  donepezil (ARICEPT) 10 MG tablet Take 1 tablet (10 mg total) by mouth at bedtime. Patient not taking: No sig reported 04/18/21   [name], [name], MD  doxycycline (VIBRA-TABS) 100 MG  tablet Take 100 mg by mouth 2 (two) times  daily. Patient not taking: Reported on 06/19/2021 06/01/21   [provider]   Allergies  Allergen Reactions   Amoxicillin     Other reaction(s): HIVES   Review of Systems  Unable to perform ROS: Dementia   Physical Exam Vitals and nursing note reviewed.  Constitutional:      General: He is not in acute distress.    Appearance: He is normal weight. He is ill-appearing.  HENT:     Head:     Comments: Temporal wasting Cardiovascular:     Rate and Rhythm: Normal rate.  Pulmonary:     Effort: Pulmonary effort is normal. No respiratory distress.  Skin:    General: Skin is warm and dry.  Neurological:     Mental Status: He is alert.     Comments: Unable to tell me his name.  Psychiatric:        Mood and Affect: Mood normal.        Behavior: Behavior normal.     Comments: Calm and cooperative, not fearful    Vital Signs: BP 129/79   Pulse 67   Temp 97.9 F (36.6 C) (Oral)   Resp 19   Ht 6\' 2"  (1.88 m)   Wt 86.7 kg   SpO2 100%   BMI 24.54 kg/m  Pain Scale: 0-10   Pain Score: 0-No pain   SpO2: SpO2: 100 % O2 Device:SpO2: 100 % O2 Flow Rate: .   IO: Intake/output summary:  Intake/Output Summary (Last 24 hours) at 06/21/2021 4765 Last data filed at 06/20/2021 1715 Gross per 24 hour  Intake 916.65 ml  Output --  Net 916.65 ml    LBM: Last BM Date:  (patient unable to answer) Baseline Weight: Weight: 92.1 kg Most recent weight: Weight: 86.7 kg     Palliative Assessment/Data:   Flowsheet Rows    Flowsheet Row Most Recent Value  Intake Tab   Unit at Time of Referral Med/Surg Unit  Palliative Care Primary Diagnosis Other (Comment)  Date Notified 06/20/21  Palliative Care Type New Palliative care  Reason for referral Clarify Goals of Care  Date of Admission 06/19/21  Date first seen by Palliative Care 06/21/21  # of days Palliative referral response time 1 Day(s)  # of days IP prior to Palliative  referral 1  Clinical Assessment   Palliative Performance Scale Score 40%  Pain Max last 24 hours Not able to report  Pain Min Last 24 hours Not able to report  Dyspnea Max Last 24 Hours Not able to report  Dyspnea Min Last 24 hours Not able to report  Psychosocial & Spiritual Assessment   Palliative Care Outcomes        Time In: 0930 Time Out: 1040 Time Total: 70 minutes  Greater than 50%  of this time was spent counseling and coordinating care related to the above assessment and plan.  Signed by: Drue Novel, NP   Please contact Palliative Medicine Team phone at [phone] for questions and concerns.  For individual provider: See Shea Evans

## 2021-06-21 NOTE — Progress Notes (Signed)
PROGRESS NOTE  Brett Pearson. FWY:[id number] DOB: Feb 25, 1941 DOA: 06/19/2021 PCP: [name], [name], MD  Brief History:  80 y.o. male with medical history significant for dementia, atrial fibrillation, cirrhosis, hypertension, interstitial lung disease, right BKA. Patient to the ED via EMS reports that patient's mental status declined today.  Patient's wife reported that today patient was unable or did not remember how to get out of the bed, he has significant dementia but this was unusual for him.  She also reports that patient's urine had an odor to it, and he was refusing to wear his condom catheter at home as he has urinary incontinence.  She reports patient has been confused over the past 2 days.  In the ED,  patient is awake and alert, pt oriented to name only.  Otherwise not oriented to place time or situation.  He is responding to questions, but his speech is confused.  He denies pain. ED Course: Stable vitals.  97.7.  WBC 6.7.  INR 2.5.  Stable creatinine-0.04.  Head CT without acute abnormality.  Chest x-ray shows left lower lobe linear densities likely scarring.  UA not suggestive of urinary tract infection.  COVID test negative.  Hospitalist to admit for altered mental status.    Assessment/Plan: Acute metabolic encephalopathy with baseline Alzheimer's dementia- -currently awake and alert, oriented to person only.   -Head CT without acute abnormality.  Stable vitals afebrile without leukocytosis.   -UA not suggestive of UTI.   -chest x-ray without acute abnormality.   -Likely progression of dementia.  I am unable to determine patient's baseline. -significant reports that she is no longer able to take care of patient at home, and patient needs SNF placement.  -Check ammonia level <10 -up to one week ago patient was able to make transfers and ambulate with walker - 216mls given in ED, continue D 5 N/s 75cc/hr x 15hrs - PT eval, TOC consult, patient will need  placement. -Hold gabapentin for now -B12 554 -folate 22.7 -TSH 5.902 -UA neg   Cryptogenic liver cirrhosis  -appears stable without evidence of  decompensation -Liver Enzymes unremarkable. -Check Ammonia level <10   Atrial fibrillation--type unspecified -rate controlled and on anticoagulation with warfarin.  INR therapeutic at 2.5. -Resume warfarin pharmacy to dose -Resume metoprolol   Hypertension- -stable. -Resume metoprolol  GOC -palliative consulted>>continue full scope of care      Status is: Inpatient  Remains inpatient appropriate because: severity of illness        Family Communication:   significant other updated 11/16  Consultants:  palliative  Code Status:  FULL   DVT Prophylaxis: warfarub   Procedures: As Listed in Progress Note Above  Antibiotics: None      Subjective: Patient denies fevers, chills, headache, chest pain, dyspnea, nausea, vomiting, diarrhea, abdominal pain, dysuria, hematuria, hematochezia, and melena.   Objective: Vitals:   06/20/21 2114 06/21/21 0649 06/21/21 0852 06/21/21 1346  BP: 120/62 125/70 129/79 137/74  Pulse: 65 73 67 (!) 59  Resp: 20 19  17   Temp: 97.9 F (36.6 C)   98 F (36.7 C)  TempSrc: Oral     SpO2: 100% 100%  100%  Weight:      Height:        Intake/Output Summary (Last 24 hours) at 06/21/2021 1754 Last data filed at 06/21/2021 1300 Gross per 24 hour  Intake 360 ml  Output --  Net 360 ml   Weight change:  Exam:  General:  Pt is alert, follows commands appropriately, not in acute distress HEENT: No icterus, No thrush, No neck mass, [place]/AT Cardiovascular: RRR, S1/S2, no rubs, no gallops Respiratory: CTA bilaterally, no wheezing, no crackles, no rhonchi Abdomen: Soft/+BS, non tender, non distended, no guarding Extremities: No edema, No lymphangitis, No petechiae, No rashes, no synovitis   Data Reviewed: I have personally reviewed following labs and imaging studies Basic  Metabolic Panel: Recent Labs  Lab 06/19/21 1526  NA 137  K 4.3  CL 104  CO2 30  GLUCOSE 60*  BUN 16  CREATININE 1.04  CALCIUM 8.8*   Liver Function Tests: Recent Labs  Lab 06/19/21 1526  AST 19  ALT 12  ALKPHOS 67  BILITOT 0.9  PROT 6.7  ALBUMIN 3.3*   No results for input(s): LIPASE, AMYLASE in the last 168 hours. Recent Labs  Lab 06/19/21 2229  AMMONIA <10   Coagulation Profile: Recent Labs  Lab 06/19/21 1526 06/20/21 0453 06/21/21 0521  INR 2.5* 2.3* 2.2*   CBC: Recent Labs  Lab 06/19/21 1526  WBC 6.7  NEUTROABS 4.4  HGB 12.3*  HCT 37.1*  MCV 101.4*  PLT 154   Cardiac Enzymes: No results for input(s): CKTOTAL, CKMB, CKMBINDEX, TROPONINI in the last 168 hours. BNP: Invalid input(s): POCBNP CBG: Recent Labs  Lab 06/19/21 1950  GLUCAP 81   HbA1C: No results for input(s): HGBA1C in the last 72 hours. Urine analysis:    Component Value Date/Time   COLORURINE YELLOW 06/19/2021 1830   APPEARANCEUR CLEAR 06/19/2021 1830   APPEARANCEUR Cloudy (A) 05/31/2021 1643   LABSPEC 1.008 06/19/2021 1830   [place] 7.0 06/19/2021 1830   GLUCOSEU NEGATIVE 06/19/2021 1830   HGBUR NEGATIVE 06/19/2021 1830   BILIRUBINUR NEGATIVE 06/19/2021 1830   BILIRUBINUR Negative 05/31/2021 [place] 06/19/2021 1830   PROTEINUR NEGATIVE 06/19/2021 1830   NITRITE NEGATIVE 06/19/2021 1830   LEUKOCYTESUR NEGATIVE 06/19/2021 1830   Sepsis Labs: @LABRCNTIP (procalcitonin:4,lacticidven:4) ) Recent Results (from the past 240 hour(s))  Resp Panel by RT-PCR (Flu A&B, Covid) Nasopharyngeal Swab     Status: None   Collection Time: 06/19/21  3:23 PM   Specimen: Nasopharyngeal Swab; Nasopharyngeal(NP) swabs in vial transport medium  Result Value Ref Range Status   SARS Coronavirus 2 by RT PCR NEGATIVE NEGATIVE Final    Comment: (NOTE) SARS-CoV-2 target nucleic acids are NOT DETECTED.  The SARS-CoV-2 RNA is generally detectable in upper respiratory specimens  during the acute phase of infection. The lowest concentration of SARS-CoV-2 viral copies this assay can detect is 138 copies/mL. A negative result does not preclude SARS-Cov-2 infection and should not be used as the sole basis for treatment or other patient management decisions. A negative result may occur with  improper specimen collection/handling, submission of specimen other than nasopharyngeal swab, presence of viral mutation(s) within the areas targeted by this assay, and inadequate number of viral copies(<138 copies/mL). A negative result must be combined with clinical observations, patient history, and epidemiological information. The expected result is Negative.  Fact Sheet for Patients:  EntrepreneurPulse.com.au  Fact Sheet for Healthcare Providers:  IncredibleEmployment.be  This test is no t yet approved or cleared by the [place] FDA and  has been authorized for detection and/or diagnosis of SARS-CoV-2 by FDA under an Emergency Use Authorization (EUA). This EUA will remain  in effect (meaning this test can be used) for the duration of the COVID-19 declaration under Section 564(b)(1) of the Act, 21 U.S.C.section 360bbb-3(b)(1), unless the  authorization is terminated  or revoked sooner.       Influenza A by PCR NEGATIVE NEGATIVE Final   Influenza B by PCR NEGATIVE NEGATIVE Final    Comment: (NOTE) The Xpert Xpress SARS-CoV-2/FLU/RSV plus assay is intended as an aid in the diagnosis of influenza from Nasopharyngeal swab specimens and should not be used as a sole basis for treatment. Nasal washings and aspirates are unacceptable for Xpert Xpress SARS-CoV-2/FLU/RSV testing.  Fact Sheet for Patients: EntrepreneurPulse.com.au  Fact Sheet for Healthcare Providers: IncredibleEmployment.be  This test is not yet approved or cleared by the [place] FDA and has been authorized for detection  and/or diagnosis of SARS-CoV-2 by FDA under an Emergency Use Authorization (EUA). This EUA will remain in effect (meaning this test can be used) for the duration of the COVID-19 declaration under Section 564(b)(1) of the Act, 21 U.S.C. section 360bbb-3(b)(1), unless the authorization is terminated or revoked.  Performed at [facility], [address] [place], [place] [address]      Scheduled Meds:  melatonin  9 mg Oral Daily   metoprolol succinate  25 mg Oral Daily   [START ON 06/23/2021] warfarin  4 mg Oral Once per day on Mon Fri   warfarin  6 mg Oral Once per day on Sun Tue Wed Thu Sat   Warfarin - Pharmacist Dosing Inpatient   Does not apply q1600   Continuous Infusions:  Procedures/Studies: DG Chest 2 View  Result Date: 06/19/2021 CLINICAL DATA:  Altered mental status EXAM: CHEST - 2 VIEW COMPARISON:  Previous studies including the examination of 07/31/2018 FINDINGS: Transverse diameter of heart is increased. Apparent shift of mediastinum to the right may be due to rotation. There are no signs of alveolar pulmonary edema. Linear densities in the left lower lung fields have not changed significantly, possibly scarring. There is no focal pulmonary consolidation. There is no significant pleural effusion or pneumothorax. There is a linear high density along the right margin of heart, possibly pericardial calcification which has not changed significantly. Colon appears to be interposed between the liver and anterior abdominal wall in the right subdiaphragmatic location. IMPRESSION: Cardiomegaly. There are no signs of pulmonary edema or focal pulmonary consolidation. Linear densities in the left lower lung fields have not changed significantly, possibly suggesting scarring. Electronically Signed   By: Elmer Picker M.D.   On: 06/19/2021 16:54   CT Head Wo Contrast  Result Date: 06/19/2021 CLINICAL DATA:  Mental status change. EXAM: CT HEAD WITHOUT CONTRAST TECHNIQUE: Contiguous  axial images were obtained from the base of the skull through the vertex without intravenous contrast. COMPARISON:  None. BRAIN: BRAIN Cerebral ventricle sizes are concordant with the degree of cerebral volume loss. Patchy and confluent areas of decreased attenuation are noted throughout the deep and periventricular white matter of the cerebral hemispheres bilaterally, compatible with chronic microvascular ischemic disease. No evidence of large-territorial acute infarction. No parenchymal hemorrhage. No mass lesion. No extra-axial collection. No mass effect or midline shift. No hydrocephalus. Basilar cisterns are patent. Vascular: No hyperdense vessel. Atherosclerotic calcifications are present within the cavernous internal carotid arteries. Skull: No acute fracture or focal lesion. Sinuses/Orbits: Paranasal sinuses and mastoid air cells are clear. Likely bilateral lens replacement. Otherwise orbits are unremarkable. Other: None. IMPRESSION: No acute intracranial abnormality. Electronically Signed   By: Iven Finn M.D.   On: 06/19/2021 16:52    Orson Eva, DO  [facility]  If 7PM-7AM, please contact night-coverage [email or web address] Password [email or web address] 06/21/2021, 5:54 PM   LOS:  1 day

## 2021-06-22 ENCOUNTER — Ambulatory Visit: Payer: PPO | Admitting: Licensed Clinical Social Worker

## 2021-06-22 DIAGNOSIS — M6281 Muscle weakness (generalized): Secondary | ICD-10-CM | POA: Diagnosis not present

## 2021-06-22 DIAGNOSIS — I482 Chronic atrial fibrillation, unspecified: Secondary | ICD-10-CM | POA: Diagnosis not present

## 2021-06-22 DIAGNOSIS — G308 Other Alzheimer's disease: Secondary | ICD-10-CM

## 2021-06-22 DIAGNOSIS — F02818 Dementia in other diseases classified elsewhere, unspecified severity, with other behavioral disturbance: Secondary | ICD-10-CM | POA: Diagnosis not present

## 2021-06-22 DIAGNOSIS — J849 Interstitial pulmonary disease, unspecified: Secondary | ICD-10-CM | POA: Diagnosis not present

## 2021-06-22 DIAGNOSIS — S88111A Complete traumatic amputation at level between knee and ankle, right lower leg, initial encounter: Secondary | ICD-10-CM

## 2021-06-22 DIAGNOSIS — D510 Vitamin B12 deficiency anemia due to intrinsic factor deficiency: Secondary | ICD-10-CM | POA: Diagnosis not present

## 2021-06-22 DIAGNOSIS — I10 Essential (primary) hypertension: Secondary | ICD-10-CM | POA: Diagnosis not present

## 2021-06-22 DIAGNOSIS — R262 Difficulty in walking, not elsewhere classified: Secondary | ICD-10-CM | POA: Diagnosis not present

## 2021-06-22 DIAGNOSIS — Z7401 Bed confinement status: Secondary | ICD-10-CM | POA: Diagnosis not present

## 2021-06-22 DIAGNOSIS — R791 Abnormal coagulation profile: Secondary | ICD-10-CM | POA: Diagnosis not present

## 2021-06-22 DIAGNOSIS — M1460 Charcot's joint, unspecified site: Secondary | ICD-10-CM | POA: Diagnosis not present

## 2021-06-22 DIAGNOSIS — E785 Hyperlipidemia, unspecified: Secondary | ICD-10-CM | POA: Diagnosis not present

## 2021-06-22 DIAGNOSIS — F0280 Dementia in other diseases classified elsewhere without behavioral disturbance: Secondary | ICD-10-CM | POA: Diagnosis not present

## 2021-06-22 DIAGNOSIS — G301 Alzheimer's disease with late onset: Secondary | ICD-10-CM | POA: Diagnosis not present

## 2021-06-22 DIAGNOSIS — N3945 Continuous leakage: Secondary | ICD-10-CM | POA: Diagnosis not present

## 2021-06-22 DIAGNOSIS — Z7189 Other specified counseling: Secondary | ICD-10-CM | POA: Diagnosis not present

## 2021-06-22 DIAGNOSIS — R413 Other amnesia: Secondary | ICD-10-CM

## 2021-06-22 DIAGNOSIS — Z515 Encounter for palliative care: Secondary | ICD-10-CM | POA: Diagnosis not present

## 2021-06-22 DIAGNOSIS — E119 Type 2 diabetes mellitus without complications: Secondary | ICD-10-CM | POA: Diagnosis not present

## 2021-06-22 DIAGNOSIS — G629 Polyneuropathy, unspecified: Secondary | ICD-10-CM | POA: Diagnosis not present

## 2021-06-22 DIAGNOSIS — E559 Vitamin D deficiency, unspecified: Secondary | ICD-10-CM | POA: Diagnosis not present

## 2021-06-22 DIAGNOSIS — K219 Gastro-esophageal reflux disease without esophagitis: Secondary | ICD-10-CM | POA: Diagnosis not present

## 2021-06-22 DIAGNOSIS — G4700 Insomnia, unspecified: Secondary | ICD-10-CM | POA: Diagnosis not present

## 2021-06-22 DIAGNOSIS — E46 Unspecified protein-calorie malnutrition: Secondary | ICD-10-CM | POA: Diagnosis not present

## 2021-06-22 DIAGNOSIS — F02C3 Dementia in other diseases classified elsewhere, severe, with mood disturbance: Secondary | ICD-10-CM | POA: Diagnosis not present

## 2021-06-22 DIAGNOSIS — G9341 Metabolic encephalopathy: Secondary | ICD-10-CM | POA: Diagnosis not present

## 2021-06-22 DIAGNOSIS — I480 Paroxysmal atrial fibrillation: Secondary | ICD-10-CM | POA: Diagnosis not present

## 2021-06-22 DIAGNOSIS — Z7901 Long term (current) use of anticoagulants: Secondary | ICD-10-CM | POA: Diagnosis not present

## 2021-06-22 DIAGNOSIS — R404 Transient alteration of awareness: Secondary | ICD-10-CM | POA: Diagnosis not present

## 2021-06-22 DIAGNOSIS — Z89511 Acquired absence of right leg below knee: Secondary | ICD-10-CM | POA: Diagnosis not present

## 2021-06-22 DIAGNOSIS — R279 Unspecified lack of coordination: Secondary | ICD-10-CM | POA: Diagnosis not present

## 2021-06-22 DIAGNOSIS — E538 Deficiency of other specified B group vitamins: Secondary | ICD-10-CM | POA: Diagnosis not present

## 2021-06-22 DIAGNOSIS — R1312 Dysphagia, oropharyngeal phase: Secondary | ICD-10-CM | POA: Diagnosis not present

## 2021-06-22 DIAGNOSIS — K7460 Unspecified cirrhosis of liver: Secondary | ICD-10-CM | POA: Diagnosis not present

## 2021-06-22 DIAGNOSIS — D649 Anemia, unspecified: Secondary | ICD-10-CM | POA: Diagnosis not present

## 2021-06-22 DIAGNOSIS — K7469 Other cirrhosis of liver: Secondary | ICD-10-CM | POA: Diagnosis not present

## 2021-06-22 DIAGNOSIS — G309 Alzheimer's disease, unspecified: Secondary | ICD-10-CM | POA: Diagnosis not present

## 2021-06-22 DIAGNOSIS — G546 Phantom limb syndrome with pain: Secondary | ICD-10-CM | POA: Diagnosis not present

## 2021-06-22 LAB — BASIC METABOLIC PANEL
Anion gap: 7 (ref 5–15)
BUN: 11 mg/dL (ref 8–23)
CO2: 26 mmol/L (ref 22–32)
Calcium: 8.9 mg/dL (ref 8.9–10.3)
Chloride: 105 mmol/L (ref 98–111)
Creatinine, Ser: 0.89 mg/dL (ref 0.61–1.24)
GFR, Estimated: >60 mL/min (ref >60)
Glucose, Bld: 76 mg/dL (ref 70–99)
Potassium: 3.8 mmol/L (ref 3.5–5.1)
Sodium: 138 mmol/L (ref 135–145)

## 2021-06-22 LAB — PROTIME-INR
INR: 2.7 — ABNORMAL HIGH (ref 0.8–1.2)
Prothrombin Time: 28.4 seconds — ABNORMAL HIGH (ref 11.4–15.2)

## 2021-06-22 LAB — MAGNESIUM: Magnesium: 2.4 mg/dL (ref 1.7–2.4)

## 2021-06-22 MED ORDER — WARFARIN SODIUM 2 MG PO TABS: 2.0000 mg | ORAL_TABLET | Freq: Once | ORAL | Status: DC

## 2021-06-22 MED ORDER — MELATONIN 3 MG PO TABS: 9.0000 mg | ORAL_TABLET | Freq: Every day | ORAL | 0 refills | Status: DC

## 2021-06-22 NOTE — Progress Notes (Signed)
ANTICOAGULATION CONSULT NOTE -   Pharmacy Consult for warfarin Indication: atrial fibrillation  Allergies  Allergen Reactions   Amoxicillin     Other reaction(s): HIVES    Patient Measurements: Height: 6\' 2"  (188 cm) Weight: 86.7 kg (191 lb 2.2 oz) IBW/kg (Calculated) : 82.2  Vital Signs: Temp: 98.1 F (36.7 C) (11/17 0516) Temp Source: Oral (11/16 2132) BP: 148/75 (11/17 0516) Pulse Rate: 63 (11/17 0516)  Labs: Recent Labs    06/19/21 1526 06/20/21 0453 06/21/21 0521 06/22/21 0502  HGB 12.3*  --   --   --   HCT 37.1*  --   --   --   PLT 154  --   --   --   LABPROT 26.6* 25.0* 24.6* 28.4*  INR 2.5* 2.3* 2.2* 2.7*  CREATININE 1.04  --   --  0.89     Estimated Creatinine Clearance: 77 mL/min (by C-G formula based on SCr of 0.89 mg/dL).   Medical History: Past Medical History:  Diagnosis Date   Atrial fibrillation ([place])    Hypokalemia    Neuropathy    bilateral feet    S/P BKA (below knee amputation) ([place]) 2009   right    Venous insufficiency     Medications:  See electronic med rec  Assessment: 80 y.o. M presents with AMS. Pt on warfarin PTA for afib. Admission INR 2.5 (therapeutic). CBC ok on admission. Home dose: 6mg  daily except for 4mg  on Mon and Fri  INR 2.5 > 2.3 > 2.2> 2.7  Goal of Therapy:  INR 2-3 Monitor platelets by anticoagulation protocol: Yes   Plan:  Warfarin 2 mg daily X 1 dose Daily INR  Margot Ables, PharmD Clinical Pharmacist 06/22/2021 8:10 AM

## 2021-06-22 NOTE — Discharge Summary (Addendum)
Physician Discharge Summary  [name] FIE:[id number] DOB: 10-13-1940 DOA: 06/19/2021  PCP: [name], [name], MD  Admit date: 06/19/2021 Discharge date: 06/22/2021  Admitted From: Home Disposition:  SNF  Recommendations for Outpatient Follow-up:  Follow up with PCP in 1-2 weeks Please obtain BMP/CBC in one week Check INR in one week and adjust warfarin for INR 2-3     Discharge Condition: Stable CODE STATUS: FULL Diet recommendation: Heart Healthy    Brief/Interim Summary: 80 y.o. male with medical history significant for dementia, atrial fibrillation, cirrhosis, hypertension, interstitial lung disease, right BKA. Patient to the ED via EMS reports that patient's mental status declined today.  Patient's wife reported that today patient was unable or did not remember how to get out of the bed, he has significant dementia but this was unusual for him.  She also reports that patient's urine had an odor to it, and he was refusing to wear his condom catheter at home as he has urinary incontinence.  She reports patient has been confused over the past 2 days.   In the ED,  patient is awake and alert, pt oriented to name only.  Otherwise not oriented to place time or situation.  He is responding to questions, but his speech is confused.  He denies pain. ED Course: Stable vitals.  97.7.  WBC 6.7.  INR 2.5.  Stable creatinine-0.04.  Head CT without acute abnormality.  Chest x-ray shows left lower lobe linear densities likely scarring.  UA not suggestive of urinary tract infection.  COVID test negative.  Hospitalist to admit for altered mental status. Work up was essentially unremarkable.  His mental status remained stable without any worsening or significant improvement.  PT was consulted and felt patient would benefit from SNF STR.  His significant other ultimately agreed.  Discharge Diagnoses:  Acute metabolic encephalopathy with baseline Alzheimer's dementia- -currently awake and  alert, oriented to person only.   -Head CT without acute abnormality.  Stable vitals afebrile without leukocytosis.   -UA not suggestive of UTI.   -chest x-ray without acute abnormality.   -Likely progression of dementia.   -his recent worsening may be due to recent discontinuation of his Aricept>>restart -significant reports that she is no longer able to take care of patient at home, and patient needs SNF placement.  -Check ammonia level <10 -up to one week ago patient was able to make transfers and ambulate with walker - 227mls given in ED, continue D 5 N/s 75cc/hr x 15hrs - PT eval, TOC consult, patient will need placement. -Hold gabapentin for now -B12 554 -folate 22.7 -TSH 5.902 -UA neg   Cryptogenic liver cirrhosis  -appears stable without evidence of  decompensation -Liver Enzymes unremarkable. -Check Ammonia level <10 -oupt GI follow up   Atrial fibrillation--type unspecified -rate controlled and on anticoagulation with warfarin.  INR therapeutic at 2.7 at time of d/c -Resume warfarin pharmacy to dose -Resume metoprolol   Hypertension- -stable. -Resume metoprolol  R-BKA -continue PT/OT   GOC -palliative consulted>>continue full scope of care     Discharge Instructions   Allergies as of 06/22/2021       Reactions   Amoxicillin    Other reaction(s): HIVES        Medication List     STOP taking these medications    doxycycline 100 MG tablet Commonly known as: VIBRA-TABS   gabapentin 300 MG capsule Commonly known as: NEURONTIN       TAKE these medications    cholecalciferol  1000 units tablet Commonly known as: VITAMIN D Take 2,000 Units by mouth daily.   donepezil 10 MG tablet Commonly known as: ARICEPT Take 1 tablet (10 mg total) by mouth at bedtime.   Magnesium Gluconate 500 (27 Mg) MG Tabs Take 500 mg by mouth.   melatonin 3 MG Tabs tablet Take 3 tablets (9 mg total) by mouth at bedtime. What changed:  medication strength how  much to take when to take this   metoprolol succinate 25 MG 24 hr tablet Commonly known as: TOPROL-XL Take 1 tablet (25 mg total) by mouth daily.   multivitamin tablet Take 1 tablet by mouth daily.   VITAMIN B-12 PO Take by mouth daily.   vitamin C 1000 MG tablet Take 1,000 mg by mouth daily.   warfarin 2 MG tablet Commonly known as: COUMADIN Take as directed. If you are unsure how to take this medication, talk to your nurse or doctor. Original instructions: 6 mg on every day except Mondays and Fridays, take 4 mg on Monday and Friday        Contact information for after-discharge care     [facility] .   Service: Skilled Nursing Contact information: [address] [phone]                    Allergies  Allergen Reactions   Amoxicillin     Other reaction(s): HIVES    Consultations: palliative   Procedures/Studies: DG Chest 2 View  Result Date: 06/19/2021 CLINICAL DATA:  Altered mental status EXAM: CHEST - 2 VIEW COMPARISON:  Previous studies including the examination of 07/31/2018 FINDINGS: Transverse diameter of heart is increased. Apparent shift of mediastinum to the right may be due to rotation. There are no signs of alveolar pulmonary edema. Linear densities in the left lower lung fields have not changed significantly, possibly scarring. There is no focal pulmonary consolidation. There is no significant pleural effusion or pneumothorax. There is a linear high density along the right margin of heart, possibly pericardial calcification which has not changed significantly. Colon appears to be interposed between the liver and anterior abdominal wall in the right subdiaphragmatic location. IMPRESSION: Cardiomegaly. There are no signs of pulmonary edema or focal pulmonary consolidation. Linear densities in the left lower lung fields have not changed significantly,  possibly suggesting scarring. Electronically Signed   By: Elmer Picker M.D.   On: 06/19/2021 16:54   CT Head Wo Contrast  Result Date: 06/19/2021 CLINICAL DATA:  Mental status change. EXAM: CT HEAD WITHOUT CONTRAST TECHNIQUE: Contiguous axial images were obtained from the base of the skull through the vertex without intravenous contrast. COMPARISON:  None. BRAIN: BRAIN Cerebral ventricle sizes are concordant with the degree of cerebral volume loss. Patchy and confluent areas of decreased attenuation are noted throughout the deep and periventricular white matter of the cerebral hemispheres bilaterally, compatible with chronic microvascular ischemic disease. No evidence of large-territorial acute infarction. No parenchymal hemorrhage. No mass lesion. No extra-axial collection. No mass effect or midline shift. No hydrocephalus. Basilar cisterns are patent. Vascular: No hyperdense vessel. Atherosclerotic calcifications are present within the cavernous internal carotid arteries. Skull: No acute fracture or focal lesion. Sinuses/Orbits: Paranasal sinuses and mastoid air cells are clear. Likely bilateral lens replacement. Otherwise orbits are unremarkable. Other: None. IMPRESSION: No acute intracranial abnormality. Electronically Signed   By: Iven Finn M.D.   On: 06/19/2021 16:52  Discharge Exam: Vitals:   06/22/21 0516 06/22/21 0900  BP: (!) 148/75 136/80  Pulse: 63 70  Resp: 19 20  Temp: 98.1 F (36.7 C) 98.6 F (37 C)  SpO2: 99% 100%   Vitals:   06/21/21 1346 06/21/21 2132 06/22/21 0516 06/22/21 0900  BP: 137/74 128/74 (!) 148/75 136/80  Pulse: (!) 59 74 63 70  Resp: 17 18 19 20   Temp: 98 F (36.7 C) 97.7 F (36.5 C) 98.1 F (36.7 C) 98.6 F (37 C)  TempSrc:  Oral  Oral  SpO2: 100% 99% 99% 100%  Weight:      Height:        General: Pt is alert, awake, not in acute distress Cardiovascular: RRR, S1/S2 +, no rubs, no gallops Respiratory: CTA bilaterally, no  wheezing, no rhonchi Abdominal: Soft, NT, ND, bowel sounds + Extremities: no edema, no cyanosis; R-BKA   The results of significant diagnostics from this hospitalization (including imaging, microbiology, ancillary and laboratory) are listed below for reference.    Significant Diagnostic Studies: DG Chest 2 View  Result Date: 06/19/2021 CLINICAL DATA:  Altered mental status EXAM: CHEST - 2 VIEW COMPARISON:  Previous studies including the examination of 07/31/2018 FINDINGS: Transverse diameter of heart is increased. Apparent shift of mediastinum to the right may be due to rotation. There are no signs of alveolar pulmonary edema. Linear densities in the left lower lung fields have not changed significantly, possibly scarring. There is no focal pulmonary consolidation. There is no significant pleural effusion or pneumothorax. There is a linear high density along the right margin of heart, possibly pericardial calcification which has not changed significantly. Colon appears to be interposed between the liver and anterior abdominal wall in the right subdiaphragmatic location. IMPRESSION: Cardiomegaly. There are no signs of pulmonary edema or focal pulmonary consolidation. Linear densities in the left lower lung fields have not changed significantly, possibly suggesting scarring. Electronically Signed   By: Elmer Picker M.D.   On: 06/19/2021 16:54   CT Head Wo Contrast  Result Date: 06/19/2021 CLINICAL DATA:  Mental status change. EXAM: CT HEAD WITHOUT CONTRAST TECHNIQUE: Contiguous axial images were obtained from the base of the skull through the vertex without intravenous contrast. COMPARISON:  None. BRAIN: BRAIN Cerebral ventricle sizes are concordant with the degree of cerebral volume loss. Patchy and confluent areas of decreased attenuation are noted throughout the deep and periventricular white matter of the cerebral hemispheres bilaterally, compatible with chronic microvascular ischemic  disease. No evidence of large-territorial acute infarction. No parenchymal hemorrhage. No mass lesion. No extra-axial collection. No mass effect or midline shift. No hydrocephalus. Basilar cisterns are patent. Vascular: No hyperdense vessel. Atherosclerotic calcifications are present within the cavernous internal carotid arteries. Skull: No acute fracture or focal lesion. Sinuses/Orbits: Paranasal sinuses and mastoid air cells are clear. Likely bilateral lens replacement. Otherwise orbits are unremarkable. Other: None. IMPRESSION: No acute intracranial abnormality. Electronically Signed   By: Iven Finn M.D.   On: 06/19/2021 16:52    Microbiology: Recent Results (from the past 240 hour(s))  Resp Panel by RT-PCR (Flu A&B, Covid) Nasopharyngeal Swab     Status: None   Collection Time: 06/19/21  3:23 PM   Specimen: Nasopharyngeal Swab; Nasopharyngeal(NP) swabs in vial transport medium  Result Value Ref Range Status   SARS Coronavirus 2 by RT PCR NEGATIVE NEGATIVE Final    Comment: (NOTE) SARS-CoV-2 target nucleic acids are NOT DETECTED.  The SARS-CoV-2 RNA is generally detectable in upper respiratory specimens during the acute  phase of infection. The lowest concentration of SARS-CoV-2 viral copies this assay can detect is 138 copies/mL. A negative result does not preclude SARS-Cov-2 infection and should not be used as the sole basis for treatment or other patient management decisions. A negative result may occur with  improper specimen collection/handling, submission of specimen other than nasopharyngeal swab, presence of viral mutation(s) within the areas targeted by this assay, and inadequate number of viral copies(<138 copies/mL). A negative result must be combined with clinical observations, patient history, and epidemiological information. The expected result is Negative.  Fact Sheet for Patients:  EntrepreneurPulse.com.au  Fact Sheet for Healthcare Providers:   IncredibleEmployment.be  This test is no t yet approved or cleared by the [place] FDA and  has been authorized for detection and/or diagnosis of SARS-CoV-2 by FDA under an Emergency Use Authorization (EUA). This EUA will remain  in effect (meaning this test can be used) for the duration of the COVID-19 declaration under Section 564(b)(1) of the Act, 21 U.S.C.section 360bbb-3(b)(1), unless the authorization is terminated  or revoked sooner.       Influenza A by PCR NEGATIVE NEGATIVE Final   Influenza B by PCR NEGATIVE NEGATIVE Final    Comment: (NOTE) The Xpert Xpress SARS-CoV-2/FLU/RSV plus assay is intended as an aid in the diagnosis of influenza from Nasopharyngeal swab specimens and should not be used as a sole basis for treatment. Nasal washings and aspirates are unacceptable for Xpert Xpress SARS-CoV-2/FLU/RSV testing.  Fact Sheet for Patients: EntrepreneurPulse.com.au  Fact Sheet for Healthcare Providers: IncredibleEmployment.be  This test is not yet approved or cleared by the [place] FDA and has been authorized for detection and/or diagnosis of SARS-CoV-2 by FDA under an Emergency Use Authorization (EUA). This EUA will remain in effect (meaning this test can be used) for the duration of the COVID-19 declaration under Section 564(b)(1) of the Act, 21 U.S.C. section 360bbb-3(b)(1), unless the authorization is terminated or revoked.  Performed at [facility], [address], [place], [place] [address]      Labs: Basic Metabolic Panel: Recent Labs  Lab 06/19/21 1526 06/22/21 0502  NA 137 138  K 4.3 3.8  CL 104 105  CO2 30 26  GLUCOSE 60* 76  BUN 16 11  CREATININE 1.04 0.89  CALCIUM 8.8* 8.9  MG  --  2.4   Liver Function Tests: Recent Labs  Lab 06/19/21 1526  AST 19  ALT 12  ALKPHOS 67  BILITOT 0.9  PROT 6.7  ALBUMIN 3.3*   No results for input(s): LIPASE, AMYLASE in the last 168  hours. Recent Labs  Lab 06/19/21 2229  AMMONIA <10   CBC: Recent Labs  Lab 06/19/21 1526  WBC 6.7  NEUTROABS 4.4  HGB 12.3*  HCT 37.1*  MCV 101.4*  PLT 154   Cardiac Enzymes: No results for input(s): CKTOTAL, CKMB, CKMBINDEX, TROPONINI in the last 168 hours. BNP: Invalid input(s): POCBNP CBG: Recent Labs  Lab 06/19/21 1950  GLUCAP 81    Time coordinating discharge:  36 minutes  Signed:  Orson Eva, DO [facility] Pager: [phone] 06/22/2021, 9:56 AM

## 2021-06-22 NOTE — TOC Transition Note (Signed)
Transition of Care [facility]) - CM/SW Discharge Note   Patient Details  Name: Brett Pearson. MRN: [id number] Date of Birth: 1940-12-19  Transition of Care [facility]) CM/SW Contact:  Boneta Lucks, RN Phone Number: 06/22/2021, 11:48 AM   Clinical Narrative:   Insurance auth received for SNF Auth# - [id number] and EMS -auth# [id number] from [employer]. Medical necessity printed. RN to call report, patient going to [phone]. Sent clinicals in the hub to [place]. Requested Out Patient Palliative for Patient.  TOC called wife to updated her with discharge plan as we discussed yesterday. She is upset and wanting to change facilities. He was denied first and second choice. TOC reviewed rating with her again. Then she wanted [place] to cancel discharge and send him to [facility], she has appointments today.  TOC explained she can appeal discharge, but Patient is medically ready SNF bed assigned and  INS AUTH received.  She finally agreed for me to call EMS for transport and she will visit him in [place] this afternoon.  [employer] number and address provided to [place].   Final next level of care: Skilled Nursing Facility Barriers to Discharge: Barriers Resolved   Patient Goals and CMS Choice Patient states their goals for this hospitalization and ongoing recovery are:: agreeable to SNF CMS Medicare.gov Compare Post Acute Care list provided to:: Patient Represenative (must comment) Choice offered to / list presented to : Spouse  Discharge Placement              Patient chooses bed at:  [facility]) Patient to be transferred to facility by: EMS Name of family member notified: [name] Patient and family notified of of transfer: 06/22/21  Discharge Plan and Services    [facility]                Readmission Risk Interventions Readmission Risk Prevention Plan 06/22/2021  Post Dischage Appt Complete  Medication Screening Complete  Transportation Screening Complete  Some recent data might be hidden

## 2021-06-22 NOTE — Progress Notes (Signed)
Palliative:  Mr. Cristen, [place], is resting quietly in bed.  He greets me, making and mostly keeping eye contact.  Although he is alert, he cannot tell me his name or where we are.  He is however, able to make his needs known.  There is no family at bedside at this time.  Nursing staff is at bedside attending to needs.  Call to significant other of 25 years/HC POA, Carol Hefley.  Arbie Cookey states that she is trying to "roll with it" about discharge.  We talked about [name]'s advanced directives/living will in detail.  I shared that [name] has written in section one of the document "my desire for natural death" that he would not want life support/artificial hydration or nutrition if he were to have advanced dementia which she clearly does.  At this point Arbie Cookey states that there is no need for DNR because he does not have any life-threatening illness.  I shared that this is incorrect, he could suddenly aspirate and be placed on life support.  She states, "it has to go through me first".  I share that when a person has an emergency, would go by CODE STATUS, we would not call her first.  I greatly encouraged her to review this document for herself so that she may clearly understand [name]'s wishes.  I encouraged outpatient palliative services to follow.  Although she is initially reluctant, she agrees to have outpatient consultation.  Provider choice offered, [facility] chosen.  Conference with attending, bedside nursing staff, transition of care team related to patient condition, needs, goals of care, disposition.  Plan:   At this point continue to treat the treatable.  Although Mr. Culley advanced directive/living will clearly states that he would not want life support if he would have had a terminal and incurable disease/progressive dementia, his significant other desires for him to remain full code.  Short-term rehab at [facility].  Anticipate need for long-term care.  72 minutes  Quinn Axe, NP Palliative  Medicine Team  Tem Phone [phone]

## 2021-06-22 NOTE — Progress Notes (Signed)
Pt to discharge to [facility]) via EMS. Pt informed, significant other Carole informed. This nurse has attempted call report x 2 without success, will continue to try.

## 2021-06-22 NOTE — Plan of Care (Signed)

## 2021-06-22 NOTE — Chronic Care Management (AMB) (Signed)
Chronic Care Management    Clinical Social Work Note  06/22/2021 Name: Brett Pearson. MRN: [id number] DOB: [birth year]  Brett Pearson. is a 80 y.o. year old male who is a primary care patient of [name], [name], MD. The CCM team was consulted to assist the patient with chronic disease management and/or care coordination needs related to: [name] .   Engaged with patient / Brett Pearson, Significant Other to patient, by telephone for follow up visit in response to provider referral for social work chronic care management and care coordination services.   Consent to Services:  The patient was given information about Chronic Care Management services, agreed to services, and gave verbal consent prior to initiation of services.  Please see initial visit note for detailed documentation.   Patient agreed to services and consent obtained.   Assessment: Review of patient past medical history, allergies, medications, and health status, including review of relevant consultants reports was performed today as part of a comprehensive evaluation and provision of chronic care management and care coordination services.     SDOH (Social Determinants of Health) assessments and interventions performed:  SDOH Interventions    Flowsheet Row Most Recent Value  SDOH Interventions   Physical Activity Interventions Other (Comments)  [uses a wheelchair to ambulate]  Stress Interventions Other (Comment)  [client has stress related to memory issues. Client has stress related to managing medical needs]  Depression Interventions/Treatment  Counseling        Advanced Directives Status: See Vynca application for related entries.  CCM Care Plan  Allergies  Allergen Reactions   Amoxicillin     Other reaction(s): HIVES    Facility-Administered Encounter Medications as of 06/22/2021  Medication   acetaminophen (TYLENOL) tablet 650 mg   Or   acetaminophen (TYLENOL) suppository 650 mg    LORazepam (ATIVAN) tablet 0.5 mg   melatonin tablet 9 mg   metoprolol succinate (TOPROL-XL) 24 hr tablet 25 mg   ondansetron (ZOFRAN) tablet 4 mg   Or   ondansetron (ZOFRAN) injection 4 mg   polyethylene glycol (MIRALAX / GLYCOLAX) packet 17 g   warfarin (COUMADIN) tablet 2 mg   Warfarin - Pharmacist Dosing Inpatient   Outpatient Encounter Medications as of 06/22/2021  Medication Sig Note   Ascorbic Acid (VITAMIN C) 1000 MG tablet Take 1,000 mg by mouth daily.    cholecalciferol (VITAMIN D) 1000 UNITS tablet Take 2,000 Units by mouth daily.    Cyanocobalamin (VITAMIN B-12 PO) Take by mouth daily.    donepezil (ARICEPT) 10 MG tablet Take 1 tablet (10 mg total) by mouth at bedtime. (Patient not taking: No sig reported)    doxycycline (VIBRA-TABS) 100 MG tablet Take 100 mg by mouth 2 (two) times daily. (Patient not taking: Reported on 06/19/2021)    gabapentin (NEURONTIN) 300 MG capsule Take 3 capsules (900 mg total) by mouth 2 (two) times daily.    Magnesium Gluconate 500 (27 Mg) MG TABS Take 500 mg by mouth. 07/11/2016: Received from: [facility] Received Sig: Take 500 mg by mouth.   Melatonin 10 MG TABS Take 1 tablet by mouth daily.    melatonin 3 MG TABS tablet Take 3 tablets (9 mg total) by mouth at bedtime.    metoprolol succinate (TOPROL-XL) 25 MG 24 hr tablet Take 1 tablet (25 mg total) by mouth daily.    Multiple Vitamin (MULTIVITAMIN) tablet Take 1 tablet by mouth daily.    warfarin (COUMADIN) 2 MG tablet 6  mg on every day except Mondays and Fridays, take 4 mg on Monday and Friday 06/19/2021: Pt was given only 2 mg today wife only had one tablet remaining, pt needs a second dose of 2 mg for a total of 4 mg.    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   Alzheimer's dementia ([place]) 08/03/2016   Vitamin B12 deficiency 02/13/2016   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   Essential hypertension 07/13/2015   Obesity (BMI 30-39.9) 07/13/2015   Chronic atrial fibrillation ([place]) 11/06/2012    Conditions to be addressed/monitored: monitor client completion of ADLs  Care Plan : LCSW Care Plan  Updates made by Brett Cabal, LCSW since 06/22/2021 12:00 AM     Problem: Coping Skills (General Plan of Care)      Goal: Coping Skills Enhanced: Complete ADLs daily, as able   Start Date: 06/19/2021  Expected End Date: 09/14/2021  This Visit's Progress: Not on track  Recent Progress: Not on track  Priority: High  Note:   Current barriers:   Patient in need of assistance with connecting to community resources for help in completing ADLs Patient is unable to independently navigate community resource options without care coordination support Mobility issues Memory issues  Clinical Goals:   Patient will communicate with LCSW in next 30 days to discuss ADLs completion of client Patient will communicate will RNCM as needed in next 30 days for nursing support Patient will cooperate with in home care support staff helping him weekly in the home as scheduled  Clinical Interventions:  Collaboration with [name], [name], MD regarding development and update of comprehensive plan of care as evidenced by provider attestation and co-signature Discussed with [facility] , Significant Other, current needs of client. She said caring for  needs of client has been difficult recently. She said that due to her own health needs, she can no longer care for client in the home environment. She has informed Dr. Bryon Pearson that she can no longer care for client at home. Client has gone to [facility] for UTI.  She has informed hospital staff that she can no longer care for client at home.  She does have client on waiting list at [facility] and at [facility] .  [name] and LCSW discussed client needs and placement options for client.  [name] has been talking with Hospital social worker regarding placement for client. She is currently considering [facility] and [facility]. LCSW talked with [name] about Medicare benefits for client. LCSW talked with [name] about applying for Medicaid benefit for client Encouraged [name] to continue to work with Education officer, museum at [facility] related to client placement needs  Patient Coping Skills: Has support from [facility] Attends scheduled medical appointments  Patient Deficits:  Memory issues Mobility issues  Patient Goals: In next 30 days, client will Attend scheduled medical appointments Take medications as prescribed Talk  regularly with Brett Pearson about client needs -  Follow Up Plan: LCSW to call Brett Pearson on 08/18/21 at 9:00 AM to discuss needs of client.       Norva Riffle.Tilia Faso MSW, LCSW Licensed Clinical Social Worker [facility] Care Management [phone]

## 2021-06-22 NOTE — Patient Instructions (Addendum)
Visit Information  Patient Goals: Manage Emotions. Complete ADLs daily as able.    Timeframe:  Short-Term Goal Priority:  High  Progress: Not On Track Start Date:            06/19/21                 Expected End Date:          09/14/21            Follow Up Date  08/18/21 at 9:00 AM   Manage Emotions: Complete ADLs daily as able    Why is this important?   When you are stressed, down or upset, your body reacts too.  For example, your blood pressure may get higher; you may have a headache or stomachache.  When your emotions get the best of you, your body's ability to fight off cold and flu gets weak.  These steps will help you manage your emotions.     Patient Coping Skills: Has support from [facility] Attends scheduled medical appointments  Patient Deficits:  Memory issues Mobility issues  Patient Goals: In next 30 days, client will Attend scheduled medical appointments Take medications as prescribed Talk regularly with [name] about client needs -  Follow Up Plan:  LCSW to call [name] on 08/18/21 at 9:00 AM to discuss needs of client.    Norva Riffle.Olena Willy MSW, LCSW Licensed Clinical Social Worker [facility] Care Management [phone]

## 2021-06-23 DIAGNOSIS — I482 Chronic atrial fibrillation, unspecified: Secondary | ICD-10-CM | POA: Diagnosis not present

## 2021-06-23 DIAGNOSIS — F0280 Dementia in other diseases classified elsewhere without behavioral disturbance: Secondary | ICD-10-CM | POA: Diagnosis not present

## 2021-06-23 DIAGNOSIS — J849 Interstitial pulmonary disease, unspecified: Secondary | ICD-10-CM | POA: Diagnosis not present

## 2021-06-23 DIAGNOSIS — I10 Essential (primary) hypertension: Secondary | ICD-10-CM | POA: Diagnosis not present

## 2021-06-23 DIAGNOSIS — G309 Alzheimer's disease, unspecified: Secondary | ICD-10-CM | POA: Diagnosis not present

## 2021-06-23 DIAGNOSIS — Z89511 Acquired absence of right leg below knee: Secondary | ICD-10-CM | POA: Diagnosis not present

## 2021-06-23 DIAGNOSIS — K7460 Unspecified cirrhosis of liver: Secondary | ICD-10-CM | POA: Diagnosis not present

## 2021-06-27 ENCOUNTER — Ambulatory Visit: Payer: PPO | Admitting: Licensed Clinical Social Worker

## 2021-06-27 DIAGNOSIS — K219 Gastro-esophageal reflux disease without esophagitis: Secondary | ICD-10-CM

## 2021-06-27 DIAGNOSIS — S88111A Complete traumatic amputation at level between knee and ankle, right lower leg, initial encounter: Secondary | ICD-10-CM

## 2021-06-27 DIAGNOSIS — E538 Deficiency of other specified B group vitamins: Secondary | ICD-10-CM

## 2021-06-27 DIAGNOSIS — R413 Other amnesia: Secondary | ICD-10-CM

## 2021-06-27 DIAGNOSIS — I10 Essential (primary) hypertension: Secondary | ICD-10-CM

## 2021-06-27 DIAGNOSIS — I482 Chronic atrial fibrillation, unspecified: Secondary | ICD-10-CM

## 2021-06-27 NOTE — Patient Instructions (Addendum)
Visit Information  Patient Goals:  Manage Emotions. Complete ADLs daily as able  Timeframe:  Short-Term Goal Priority:  High  Progress: On Track Start Date:          06/27/21                 Expected End Date:        09/25/21            Follow Up Date  08/18/21 at 9:00 AM   Manage Emotions: Complete ADLs daily as able    Why is this important?   When you are stressed, down or upset, your body reacts too.  For example, your blood pressure may get higher; you may have a headache or stomachache.  When your emotions get the best of you, your body's ability to fight off cold and flu gets weak.  These steps will help you manage your emotions.     Patient Coping Skills: Has support from [facility] Attends scheduled medical appointments  Patient Deficits:  Memory issues Mobility issues  Patient Goals: In next 30 days, client will Attend scheduled medical appointments Take medications as prescribed Talk regularly with [name] about client needs -  Follow Up Plan:  LCSW to call [name] on 08/18/21 at 9:00 AM to discuss needs of client.    Norva Riffle.Giavanni Odonovan MSW, LCSW Licensed Clinical Social Worker [facility] Care Management [phone]

## 2021-06-27 NOTE — Chronic Care Management (AMB) (Signed)
Chronic Care Management    Clinical Social Work Note  06/27/2021 Name: Brett Pearson. MRN: [id number] DOB: 03/30/1941  Brett Pearson. is a 80 y.o. year old male who is a primary care patient of [name], [name], MD. The CCM team was consulted to assist the patient with chronic disease management and/or care coordination needs related to: [name] .   Engaged with patient / [name], significant other for patient, by telephone for follow up visit in response to provider referral for social work chronic care management and care coordination services.   Consent to Services:  The patient was given information about Chronic Care Management services, agreed to services, and gave verbal consent prior to initiation of services.  Please see initial visit note for detailed documentation.   Patient agreed to services and consent obtained.   Assessment: Review of patient past medical history, allergies, medications, and health status, including review of relevant consultants reports was performed today as part of a comprehensive evaluation and provision of chronic care management and care coordination services.     SDOH (Social Determinants of Health) assessments and interventions performed:  SDOH Interventions    Flowsheet Row Most Recent Value  SDOH Interventions   Physical Activity Interventions Other (Comments)  [client has walking challenges. uses a walker to help him walk]  Stress Interventions Other (Comment)  [client has stress related to managing medical needs. client has mobility issues. client has incontinency issues]  Depression Interventions/Treatment  Counseling        Advanced Directives Status: See Vynca application for related entries.  CCM Care Plan  Allergies  Allergen Reactions   Amoxicillin     Other reaction(s): HIVES    Outpatient Encounter Medications as of 06/27/2021  Medication Sig Note   Ascorbic Acid (VITAMIN C) 1000 MG tablet Take  1,000 mg by mouth daily.    cholecalciferol (VITAMIN D) 1000 UNITS tablet Take 2,000 Units by mouth daily.    Cyanocobalamin (VITAMIN B-12 PO) Take by mouth daily.    donepezil (ARICEPT) 10 MG tablet Take 1 tablet (10 mg total) by mouth at bedtime. (Patient not taking: No sig reported)    Magnesium Gluconate 500 (27 Mg) MG TABS Take 500 mg by mouth. 07/11/2016: Received from: [facility] Received Sig: Take 500 mg by mouth.   melatonin 3 MG TABS tablet Take 3 tablets (9 mg total) by mouth at bedtime.    metoprolol succinate (TOPROL-XL) 25 MG 24 hr tablet Take 1 tablet (25 mg total) by mouth daily.    Multiple Vitamin (MULTIVITAMIN) tablet Take 1 tablet by mouth daily.    warfarin (COUMADIN) 2 MG tablet 6 mg on every day except Mondays and Fridays, take 4 mg on Monday and Friday 06/19/2021: Pt was given only 2 mg today wife only had one tablet remaining, pt needs a second dose of 2 mg for a total of 4 mg.   No facility-administered encounter medications on file as of 06/27/2021.    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   Interstitial lung  disease ([place]) 02/08/2017   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   Alzheimer's dementia ([place]) 08/03/2016   Vitamin B12 deficiency 02/13/2016   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   Essential hypertension 07/13/2015   Obesity (BMI 30-39.9) 07/13/2015   Chronic atrial fibrillation ([place]) 11/06/2012     Conditions to be addressed/monitored: monitor client completion of ADLs  Care Plan : LCSW Care Plan  Updates made by [name], LCSW since 06/27/2021 12:00 AM     Problem: Coping Skills (General Plan of Care)      Goal: Coping Skills Enhanced: Complete ADLs daily, as able   Start Date: 06/27/2021  Expected End Date: 09/25/2021  This Visit's Progress: On track  Recent Progress: Not on track  Priority: High  Note:   Current barriers:   Patient in need of assistance with connecting to community resources for help in completing ADLs Patient is unable to independently navigate community resource options without care coordination support Mobility issues Memory issues  Clinical Goals:   Patient will communicate with LCSW in next 30 days to discuss ADLs completion of client Patient will communicate will RNCM as needed in next 30 days for nursing support Patient will cooperate with care support staff caring for him weekly at [facility] in [place], [place].   Clinical Interventions:  Collaboration with [name], [name], MD regarding development and update of comprehensive plan of care as evidenced by provider attestation and co-signature [name] and LCSW discussed client needs and placement options for client. [name] reported that client had been placed at [facility] in [place], [place]. She said he was doing well since admitted to that SNF. She said he is eating well and is participating in physical therapy sessions, as scheduled at [facility] and SW discussed that she feels that she can no longer care for client at their home. Discussed with [name] that Medicare covers a specific number of days at [facility]. Informed that client may be charged a co-pay daily after a specific number of days he has been a patient there. She understood this information Discussed with [facility] application process for client. She said client will likely be private pay at [facility] for a while  before he could qualify for Medicaid Provided counseling support for Arbie Cookey related to client needs  Patient Coping Skills: Has support from [facility] Attends scheduled medical appointments  Patient Deficits:  Memory issues Mobility issues  Patient Goals: In next 30 days, client will Attend scheduled medical appointments Take medications as prescribed Talk regularly with [name] about client needs -  Follow Up Plan: LCSW to call [name] on 08/18/21 at 9:00 AM to discuss needs of client.       Norva Riffle.Brithney Bensen MSW, LCSW Licensed Clinical Social Worker [facility] Care Management [phone]

## 2021-06-28 DIAGNOSIS — I482 Chronic atrial fibrillation, unspecified: Secondary | ICD-10-CM | POA: Diagnosis not present

## 2021-06-28 DIAGNOSIS — I10 Essential (primary) hypertension: Secondary | ICD-10-CM | POA: Diagnosis not present

## 2021-06-28 DIAGNOSIS — G4700 Insomnia, unspecified: Secondary | ICD-10-CM | POA: Diagnosis not present

## 2021-06-28 DIAGNOSIS — G629 Polyneuropathy, unspecified: Secondary | ICD-10-CM | POA: Diagnosis not present

## 2021-06-28 DIAGNOSIS — G309 Alzheimer's disease, unspecified: Secondary | ICD-10-CM | POA: Diagnosis not present

## 2021-06-28 DIAGNOSIS — J849 Interstitial pulmonary disease, unspecified: Secondary | ICD-10-CM | POA: Diagnosis not present

## 2021-06-28 DIAGNOSIS — F0280 Dementia in other diseases classified elsewhere without behavioral disturbance: Secondary | ICD-10-CM | POA: Diagnosis not present

## 2021-06-28 DIAGNOSIS — K7460 Unspecified cirrhosis of liver: Secondary | ICD-10-CM | POA: Diagnosis not present

## 2021-07-03 ENCOUNTER — Ambulatory Visit: Payer: PPO | Admitting: Licensed Clinical Social Worker

## 2021-07-03 DIAGNOSIS — K219 Gastro-esophageal reflux disease without esophagitis: Secondary | ICD-10-CM

## 2021-07-03 DIAGNOSIS — I482 Chronic atrial fibrillation, unspecified: Secondary | ICD-10-CM

## 2021-07-03 DIAGNOSIS — I10 Essential (primary) hypertension: Secondary | ICD-10-CM

## 2021-07-03 DIAGNOSIS — R413 Other amnesia: Secondary | ICD-10-CM

## 2021-07-03 DIAGNOSIS — E538 Deficiency of other specified B group vitamins: Secondary | ICD-10-CM

## 2021-07-03 DIAGNOSIS — S88111A Complete traumatic amputation at level between knee and ankle, right lower leg, initial encounter: Secondary | ICD-10-CM

## 2021-07-03 NOTE — Patient Instructions (Addendum)
Visit Information  Patient Goals: Manage Emotions. Complete ADLs daily as able.  Timeframe:  Short-Term Goal Priority:  Medium   Progress: On Track Start Date:        07/03/21                 Expected End Date:        09/28/21            Follow Up Date   08/18/21 at 9:00 AM   Manage Emotions: Complete ADLs daily as able    Why is this important?   When you are stressed, down or upset, your body reacts too.  For example, your blood pressure may get higher; you may have a headache or stomachache.  When your emotions get the best of you, your body's ability to fight off cold and flu gets weak.  These steps will help you manage your emotions.     Patient Coping Skills: Has support from [facility] Attends scheduled medical appointments  Patient Deficits:  Memory issues Mobility issues  Patient Goals: In next 30 days, client will Attend scheduled medical appointments Take medications as prescribed Talk regularly with [name] about client needs -  Follow Up Plan:  LCSW to call [name] on 08/18/21 at 9:00 AM to discuss needs of client.    Norva Riffle.Amyla Heffner MSW, LCSW Licensed Clinical Social Worker [facility] Care Management [phone]

## 2021-07-03 NOTE — Chronic Care Management (AMB) (Signed)
Chronic Care Management    Clinical Social Work Note  07/03/2021 Name: Brett Pearson. MRN: [id number] DOB: 1941/04/23  Brett Duffel. is a 80 y.o. year old male who is a primary care patient of Brett Pearson, Brett Kaufmann, MD. The CCM team was consulted to assist the patient with chronic disease management and/or care coordination needs related to: [name] .   Engaged with patient / Brett Pearson, significant other for client, by telephone for follow up visit in response to provider referral for social work chronic care management and care coordination services.   Consent to Services:  The patient was given information about Chronic Care Management services, agreed to services, and gave verbal consent prior to initiation of services.  Please see initial visit note for detailed documentation.   Patient agreed to services and consent obtained.   Assessment: Review of patient past medical history, allergies, medications, and health status, including review of relevant consultants reports was performed today as part of a comprehensive evaluation and provision of chronic care management and care coordination services.     SDOH (Social Determinants of Health) assessments and interventions performed:  SDOH Interventions    Flowsheet Row Most Recent Value  SDOH Interventions   Physical Activity Interventions Other (Comments)  [client has ambulation challenges. He uses a wheelchair to ambulate]  Stress Interventions Other (Comment)  [client has stress related to memory issues]  Transportation Interventions Other (Comment)  [client is dependent for transportation needs]  Depression Interventions/Treatment  Counseling        Advanced Directives Status: See Vynca application for related entries.  CCM Care Plan  Allergies  Allergen Reactions   Amoxicillin     Other reaction(s): HIVES    Outpatient Encounter Medications as of 07/03/2021  Medication Sig Note   Ascorbic Acid  (VITAMIN C) 1000 MG tablet Take 1,000 mg by mouth daily.    cholecalciferol (VITAMIN D) 1000 UNITS tablet Take 2,000 Units by mouth daily.    Cyanocobalamin (VITAMIN B-12 PO) Take by mouth daily.    donepezil (ARICEPT) 10 MG tablet Take 1 tablet (10 mg total) by mouth at bedtime. (Patient not taking: No sig reported)    Magnesium Gluconate 500 (27 Mg) MG TABS Take 500 mg by mouth. 07/11/2016: Received from: [facility] Received Sig: Take 500 mg by mouth.   melatonin 3 MG TABS tablet Take 3 tablets (9 mg total) by mouth at bedtime.    metoprolol succinate (TOPROL-XL) 25 MG 24 hr tablet Take 1 tablet (25 mg total) by mouth daily.    Multiple Vitamin (MULTIVITAMIN) tablet Take 1 tablet by mouth daily.    warfarin (COUMADIN) 2 MG tablet 6 mg on every day except Mondays and Fridays, take 4 mg on Monday and Friday 06/19/2021: Pt was given only 2 mg today wife only had one tablet remaining, pt needs a second dose of 2 mg for a total of 4 mg.   No facility-administered encounter medications on file as of 07/03/2021.    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  Interstitial lung disease ([place]) 02/08/2017   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   Alzheimer's dementia ([place]) 08/03/2016   Vitamin B12 deficiency 02/13/2016   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   Essential hypertension 07/13/2015   Obesity (BMI 30-39.9) 07/13/2015   Chronic atrial  fibrillation ([place]) 11/06/2012    Conditions to be addressed/monitored: monitor client completion of ADLs  Care Plan : LCSW Care Plan  Updates made by Brett Cabal, LCSW since 07/03/2021 12:00 AM     Problem: Coping Skills (General Plan of Care)      Goal: Coping Skills Enhanced: Complete ADLs daily, as able   Start Date: 07/03/2021  Expected End Date: 09/28/2021  This Visit's Progress: On track  Recent Progress: On track  Priority: Medium  Note:   Current barriers:   Patient in need of assistance with connecting to community resources for help in completing ADLs Patient is unable to independently navigate community resource options without care coordination support Mobility issues Memory issues  Clinical Goals:   Patient will communicate with LCSW in next 30 days to discuss ADLs completion of client Patient will communicate will RNCM as needed in next 30 days for nursing support Patient will cooperate with care support staff caring for him weekly at [facility] in [place], [place] for next 30 days  Clinical Interventions:  Collaboration with Brett Pearson, Brett Kaufmann, MD regarding development and update of comprehensive plan of care as evidenced by provider attestation and co-signature Discussed previously with [facility], significant other for client,  that Medicare covers a specific number of days at [facility]. Informed that client may be charged a co-pay daily after a specific number of days he has been a patient there. She understood this information Discussed with Brett Pearson information client received recently from [place].  Encouraged Brett Pearson to call [facility] in [place], [place] and talk with [place] Social Worker about information client had received from [place].  [facility] [place], [place] to discuss information client recently received from [place]  Patient Coping Skills: Has support from [facility] Attends scheduled medical appointments  Patient  Deficits:  Memory issues Mobility issues  Patient Goals: In next 30 days, client will Attend scheduled medical appointments Take medications as prescribed Talk regularly with Brett Pearson about client needs -  Follow Up Plan: LCSW to call Brett Pearson on 08/18/21 at 9:00 AM to discuss needs of client.       Brett Pearson MSW, LCSW Licensed Clinical Social Worker [facility] Care Management [phone]

## 2021-07-05 DIAGNOSIS — I482 Chronic atrial fibrillation, unspecified: Secondary | ICD-10-CM

## 2021-07-05 DIAGNOSIS — I10 Essential (primary) hypertension: Secondary | ICD-10-CM

## 2021-07-06 DIAGNOSIS — R791 Abnormal coagulation profile: Secondary | ICD-10-CM | POA: Diagnosis not present

## 2021-07-06 DIAGNOSIS — Z7901 Long term (current) use of anticoagulants: Secondary | ICD-10-CM | POA: Diagnosis not present

## 2021-07-10 DIAGNOSIS — Z7901 Long term (current) use of anticoagulants: Secondary | ICD-10-CM | POA: Diagnosis not present

## 2021-07-10 DIAGNOSIS — R791 Abnormal coagulation profile: Secondary | ICD-10-CM | POA: Diagnosis not present

## 2021-07-14 DIAGNOSIS — Z7901 Long term (current) use of anticoagulants: Secondary | ICD-10-CM | POA: Diagnosis not present

## 2021-07-14 DIAGNOSIS — R791 Abnormal coagulation profile: Secondary | ICD-10-CM | POA: Diagnosis not present

## 2021-07-19 DIAGNOSIS — R791 Abnormal coagulation profile: Secondary | ICD-10-CM | POA: Diagnosis not present

## 2021-07-19 DIAGNOSIS — Z7901 Long term (current) use of anticoagulants: Secondary | ICD-10-CM | POA: Diagnosis not present

## 2021-07-20 ENCOUNTER — Ambulatory Visit: Payer: PPO | Admitting: Family Medicine

## 2021-07-21 DIAGNOSIS — I482 Chronic atrial fibrillation, unspecified: Secondary | ICD-10-CM | POA: Diagnosis not present

## 2021-07-21 DIAGNOSIS — Z7901 Long term (current) use of anticoagulants: Secondary | ICD-10-CM | POA: Diagnosis not present

## 2021-07-21 DIAGNOSIS — R791 Abnormal coagulation profile: Secondary | ICD-10-CM | POA: Diagnosis not present

## 2021-07-24 DIAGNOSIS — R791 Abnormal coagulation profile: Secondary | ICD-10-CM | POA: Diagnosis not present

## 2021-07-24 DIAGNOSIS — Z7901 Long term (current) use of anticoagulants: Secondary | ICD-10-CM | POA: Diagnosis not present

## 2021-07-25 DIAGNOSIS — Z7901 Long term (current) use of anticoagulants: Secondary | ICD-10-CM | POA: Diagnosis not present

## 2021-07-25 DIAGNOSIS — R791 Abnormal coagulation profile: Secondary | ICD-10-CM | POA: Diagnosis not present

## 2021-07-27 DIAGNOSIS — R791 Abnormal coagulation profile: Secondary | ICD-10-CM | POA: Diagnosis not present

## 2021-07-27 DIAGNOSIS — Z7901 Long term (current) use of anticoagulants: Secondary | ICD-10-CM | POA: Diagnosis not present

## 2021-07-31 DIAGNOSIS — R791 Abnormal coagulation profile: Secondary | ICD-10-CM | POA: Diagnosis not present

## 2021-07-31 DIAGNOSIS — Z7901 Long term (current) use of anticoagulants: Secondary | ICD-10-CM | POA: Diagnosis not present

## 2021-08-01 ENCOUNTER — Ambulatory Visit (INDEPENDENT_AMBULATORY_CARE_PROVIDER_SITE_OTHER): Payer: Self-pay | Admitting: Podiatry

## 2021-08-01 ENCOUNTER — Telehealth: Payer: PPO

## 2021-08-01 DIAGNOSIS — Z91199 Patient's noncompliance with other medical treatment and regimen due to unspecified reason: Secondary | ICD-10-CM

## 2021-08-07 DIAGNOSIS — Z7901 Long term (current) use of anticoagulants: Secondary | ICD-10-CM | POA: Diagnosis not present

## 2021-08-07 DIAGNOSIS — R791 Abnormal coagulation profile: Secondary | ICD-10-CM | POA: Diagnosis not present

## 2021-08-08 NOTE — Progress Notes (Signed)
Patient was no-show for appointment today 

## 2021-08-14 DIAGNOSIS — R279 Unspecified lack of coordination: Secondary | ICD-10-CM | POA: Diagnosis not present

## 2021-08-14 DIAGNOSIS — R791 Abnormal coagulation profile: Secondary | ICD-10-CM | POA: Diagnosis not present

## 2021-08-14 DIAGNOSIS — M6281 Muscle weakness (generalized): Secondary | ICD-10-CM | POA: Diagnosis not present

## 2021-08-14 DIAGNOSIS — Z7901 Long term (current) use of anticoagulants: Secondary | ICD-10-CM | POA: Diagnosis not present

## 2021-08-16 DIAGNOSIS — Z7901 Long term (current) use of anticoagulants: Secondary | ICD-10-CM | POA: Diagnosis not present

## 2021-08-16 DIAGNOSIS — R791 Abnormal coagulation profile: Secondary | ICD-10-CM | POA: Diagnosis not present

## 2021-08-18 ENCOUNTER — Ambulatory Visit (INDEPENDENT_AMBULATORY_CARE_PROVIDER_SITE_OTHER): Payer: PPO | Admitting: Licensed Clinical Social Worker

## 2021-08-18 DIAGNOSIS — R413 Other amnesia: Secondary | ICD-10-CM

## 2021-08-18 DIAGNOSIS — I482 Chronic atrial fibrillation, unspecified: Secondary | ICD-10-CM

## 2021-08-18 DIAGNOSIS — E538 Deficiency of other specified B group vitamins: Secondary | ICD-10-CM

## 2021-08-18 DIAGNOSIS — I10 Essential (primary) hypertension: Secondary | ICD-10-CM

## 2021-08-18 DIAGNOSIS — K219 Gastro-esophageal reflux disease without esophagitis: Secondary | ICD-10-CM

## 2021-08-18 DIAGNOSIS — Z89511 Acquired absence of right leg below knee: Secondary | ICD-10-CM

## 2021-08-18 NOTE — Chronic Care Management (AMB) (Signed)
Chronic Care Management    Clinical Social Work Note  08/18/2021 Name: Brett Pearson. MRN: [id number] DOB: Aug 23, 1940  Brett Pearson. is a 81 y.o. year old male who is a primary care patient of [name], [name], MD. The CCM team was consulted to assist the patient with chronic disease management and/or care coordination needs related to: [name] .   Engaged with patient / significant other of patient, Brett Pearson, by telephone for follow up visit in response to provider referral for social work chronic care management and care coordination services.   Consent to Services:  The patient was given information about Chronic Care Management services, agreed to services, and gave verbal consent prior to initiation of services.  Please see initial visit note for detailed documentation.   Patient agreed to services and consent obtained.   Assessment: Review of patient past medical history, allergies, medications, and health status, including review of relevant consultants reports was performed today as part of a comprehensive evaluation and provision of chronic care management and care coordination services.     SDOH (Social Determinants of Health) assessments and interventions performed:  SDOH Interventions    Flowsheet Row Most Recent Value  SDOH Interventions   Physical Activity Interventions Other (Comments)  [walking challenges. Client is receiving physical therapy sessions as scheduled. risk for fall]  Stress Interventions Other (Comment)  [client has memory issues. he has some walking challenges. he has stress related to transfers, difficulty getting in and out of bed.]  Depression Interventions/Treatment  Counseling        Advanced Directives Status: See Vynca application for related entries.  CCM Care Plan  Allergies  Allergen Reactions   Amoxicillin     Other reaction(s): HIVES    Outpatient Encounter Medications as of 08/18/2021  Medication Sig Note    Ascorbic Acid (VITAMIN C) 1000 MG tablet Take 1,000 mg by mouth daily.    cholecalciferol (VITAMIN D) 1000 UNITS tablet Take 2,000 Units by mouth daily.    Cyanocobalamin (VITAMIN B-12 PO) Take by mouth daily.    donepezil (ARICEPT) 10 MG tablet Take 1 tablet (10 mg total) by mouth at bedtime. (Patient not taking: No sig reported)    Magnesium Gluconate 500 (27 Mg) MG TABS Take 500 mg by mouth. 07/11/2016: Received from: [facility] Received Sig: Take 500 mg by mouth.   melatonin 3 MG TABS tablet Take 3 tablets (9 mg total) by mouth at bedtime.    metoprolol succinate (TOPROL-XL) 25 MG 24 hr tablet Take 1 tablet (25 mg total) by mouth daily.    Multiple Vitamin (MULTIVITAMIN) tablet Take 1 tablet by mouth daily.    warfarin (COUMADIN) 2 MG tablet 6 mg on every day except Mondays and Fridays, take 4 mg on Monday and Friday 06/19/2021: Pt was given only 2 mg today wife only had one tablet remaining, pt needs a second dose of 2 mg for a total of 4 mg.   No facility-administered encounter medications on file as of 08/18/2021.    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   Care Plan : LCSW Care Plan  Updates made by [name], LCSW since 08/18/2021 12:00 AM     Problem: Coping Skills (General Plan of Care)      Goal: Coping Skills Enhanced: Complete ADLs daily, as able   Start Date: 08/18/2021  Expected End Date: 11/10/2021  This Visit's Progress: On track  Recent Progress: On track  Priority: Medium  Note:   Current barriers:   Patient in need of assistance with connecting to community resources for help in completing ADLs Patient is unable to independently navigate community resource options without care coordination support Mobility issues Memory issues Decreased appetite  Clinical Goals:   Patient will communicate with LCSW in next 30 days to discuss ADLs completion of client Patient will communicate will RNCM as needed in next 30 days for nursing support Patient will cooperate with care support staff caring for him weekly at [facility] in [place], [place] for next 30 days   Clinical Interventions:  Collaboration with [name], [name], MD regarding development and update of comprehensive plan of care as evidenced by provider attestation and co-signature Discussed current client needs with [facility], significant other for client Discussed appetite of client. Brett Pearson said client had recently lost 5 pounds Discussed mobility of client. Brett Pearson said client is receiving Physical Therapy sessions weekly as scheduled. Discussed client placement status with Brett Pearson. Brett Pearson said she plans to transfer client this coming week to [facility] in [place], [place] Discussed [place] application for client. Brett Pearson said she has been communicating with Brett Pearson at [facility], [place] about [place] application for client. Brett Pearson said she had to collect a few more financial documents for client to turn in to [place] in  [place] for client Medicaid application.  Brett Pearson asked LCSW to call DSS in [place], [place] and talk with Brett Pearson about client status at this time. LCSW agreed to this plan. Discussed incontinence issues of client Discussed pain issues of client.  Encouraged Brett Pearson to call [facility] as needed at [facility] for nursing support related to client Encouraged Brett Pearson to call LCSW as needed at [facility] for SW support related to client LCSW called Brett Pearson today at [facility], [place] and talked with Lattie Haw about current status of client and Brett Pearson's plans to transfer client to [facility] in [place], [place] this coming week.  LCSW gave Lattie Haw phone number for Brett Pearson [phone]) and asked her to call Brett Pearson please to discuss Medicaid application questions of [facility]. Brett Pearson agreed to call Brett Pearson  Patient Coping Skills: Has support from [facility] Attends scheduled medical appointments  Patient Deficits:  Memory issues Mobility issues  Patient Goals: In next 30 days, client will Attend scheduled medical appointments Take medications as prescribed Talk regularly with Brett Pearson about client needs -  Follow Up Plan: LCSW to call Brett Pearson on 10/09/21 at 1:00 PM to discuss needs of client at that time     Norva Riffle.Jazmarie Biever MSW, LCSW Licensed Clinical Social Worker [facility] Care Management [phone]

## 2021-08-18 NOTE — Patient Instructions (Addendum)
Visit Information  Patient Goals:  Manage Emotions. Complete ADLs daily as able  Timeframe:  Short-Term Goal Priority:  Medium   Progress: On Track Start Date:       08/18/21                  Expected End Date:        11/10/21            Follow Up Date   10/09/21 at 1:00 PM   Manage Emotions: Complete ADLs daily as able    Why is this important?   When you are stressed, down or upset, your body reacts too.  For example, your blood pressure may get higher; you may have a headache or stomachache.  When your emotions get the best of you, your body's ability to fight off cold and flu gets weak.  These steps will help you manage your emotions.     Patient Coping Skills: Has support from [facility] Attends scheduled medical appointments  Patient Deficits:  Memory issues Mobility issues  Patient Goals: In next 30 days, client will Attend scheduled medical appointments Take medications as prescribed Talk regularly with [name] about client needs -  Follow Up Plan:  LCSW to call [name] on 10/09/21 at 1:00 PM to discuss needs of client at that time.    Norva Riffle.Wilson Sample MSW, LCSW Licensed Clinical Social Worker [facility] Care Management [phone]

## 2021-08-21 DIAGNOSIS — Z7901 Long term (current) use of anticoagulants: Secondary | ICD-10-CM | POA: Diagnosis not present

## 2021-08-21 DIAGNOSIS — R791 Abnormal coagulation profile: Secondary | ICD-10-CM | POA: Diagnosis not present

## 2021-08-26 DIAGNOSIS — G9341 Metabolic encephalopathy: Secondary | ICD-10-CM | POA: Diagnosis not present

## 2021-08-26 DIAGNOSIS — R2681 Unsteadiness on feet: Secondary | ICD-10-CM | POA: Diagnosis not present

## 2021-08-28 DIAGNOSIS — F0280 Dementia in other diseases classified elsewhere without behavioral disturbance: Secondary | ICD-10-CM | POA: Diagnosis not present

## 2021-08-28 DIAGNOSIS — I4891 Unspecified atrial fibrillation: Secondary | ICD-10-CM | POA: Diagnosis not present

## 2021-08-28 DIAGNOSIS — J849 Interstitial pulmonary disease, unspecified: Secondary | ICD-10-CM | POA: Diagnosis not present

## 2021-08-28 DIAGNOSIS — R791 Abnormal coagulation profile: Secondary | ICD-10-CM | POA: Diagnosis not present

## 2021-08-28 DIAGNOSIS — I482 Chronic atrial fibrillation, unspecified: Secondary | ICD-10-CM | POA: Diagnosis not present

## 2021-08-28 DIAGNOSIS — E559 Vitamin D deficiency, unspecified: Secondary | ICD-10-CM | POA: Diagnosis not present

## 2021-08-28 DIAGNOSIS — R5381 Other malaise: Secondary | ICD-10-CM | POA: Diagnosis not present

## 2021-08-28 DIAGNOSIS — I10 Essential (primary) hypertension: Secondary | ICD-10-CM | POA: Diagnosis not present

## 2021-08-28 DIAGNOSIS — Z7901 Long term (current) use of anticoagulants: Secondary | ICD-10-CM | POA: Diagnosis not present

## 2021-08-29 DIAGNOSIS — K7460 Unspecified cirrhosis of liver: Secondary | ICD-10-CM | POA: Diagnosis not present

## 2021-08-29 DIAGNOSIS — E612 Magnesium deficiency: Secondary | ICD-10-CM | POA: Diagnosis not present

## 2021-08-29 DIAGNOSIS — I482 Chronic atrial fibrillation, unspecified: Secondary | ICD-10-CM | POA: Diagnosis not present

## 2021-08-29 DIAGNOSIS — I10 Essential (primary) hypertension: Secondary | ICD-10-CM | POA: Diagnosis not present

## 2021-08-30 DIAGNOSIS — Z7901 Long term (current) use of anticoagulants: Secondary | ICD-10-CM | POA: Diagnosis not present

## 2021-08-31 DIAGNOSIS — R5381 Other malaise: Secondary | ICD-10-CM | POA: Diagnosis not present

## 2021-08-31 DIAGNOSIS — E559 Vitamin D deficiency, unspecified: Secondary | ICD-10-CM | POA: Diagnosis not present

## 2021-08-31 DIAGNOSIS — I10 Essential (primary) hypertension: Secondary | ICD-10-CM | POA: Diagnosis not present

## 2021-08-31 DIAGNOSIS — G629 Polyneuropathy, unspecified: Secondary | ICD-10-CM | POA: Diagnosis not present

## 2021-08-31 DIAGNOSIS — E538 Deficiency of other specified B group vitamins: Secondary | ICD-10-CM | POA: Diagnosis not present

## 2021-08-31 DIAGNOSIS — I4891 Unspecified atrial fibrillation: Secondary | ICD-10-CM | POA: Diagnosis not present

## 2021-08-31 DIAGNOSIS — K7460 Unspecified cirrhosis of liver: Secondary | ICD-10-CM | POA: Diagnosis not present

## 2021-08-31 DIAGNOSIS — J849 Interstitial pulmonary disease, unspecified: Secondary | ICD-10-CM | POA: Diagnosis not present

## 2021-09-01 DIAGNOSIS — I482 Chronic atrial fibrillation, unspecified: Secondary | ICD-10-CM | POA: Diagnosis not present

## 2021-09-04 DIAGNOSIS — J849 Interstitial pulmonary disease, unspecified: Secondary | ICD-10-CM | POA: Diagnosis not present

## 2021-09-04 DIAGNOSIS — G629 Polyneuropathy, unspecified: Secondary | ICD-10-CM | POA: Diagnosis not present

## 2021-09-04 DIAGNOSIS — I4891 Unspecified atrial fibrillation: Secondary | ICD-10-CM | POA: Diagnosis not present

## 2021-09-04 DIAGNOSIS — R5381 Other malaise: Secondary | ICD-10-CM | POA: Diagnosis not present

## 2021-09-04 DIAGNOSIS — E538 Deficiency of other specified B group vitamins: Secondary | ICD-10-CM | POA: Diagnosis not present

## 2021-09-04 DIAGNOSIS — E559 Vitamin D deficiency, unspecified: Secondary | ICD-10-CM | POA: Diagnosis not present

## 2021-09-04 DIAGNOSIS — G309 Alzheimer's disease, unspecified: Secondary | ICD-10-CM | POA: Diagnosis not present

## 2021-09-04 DIAGNOSIS — K7460 Unspecified cirrhosis of liver: Secondary | ICD-10-CM | POA: Diagnosis not present

## 2021-09-04 DIAGNOSIS — I10 Essential (primary) hypertension: Secondary | ICD-10-CM | POA: Diagnosis not present

## 2021-09-05 DIAGNOSIS — I482 Chronic atrial fibrillation, unspecified: Secondary | ICD-10-CM

## 2021-09-05 DIAGNOSIS — I10 Essential (primary) hypertension: Secondary | ICD-10-CM

## 2021-09-08 DIAGNOSIS — I482 Chronic atrial fibrillation, unspecified: Secondary | ICD-10-CM | POA: Diagnosis not present

## 2021-09-11 DIAGNOSIS — I482 Chronic atrial fibrillation, unspecified: Secondary | ICD-10-CM | POA: Diagnosis not present

## 2021-09-11 DIAGNOSIS — I10 Essential (primary) hypertension: Secondary | ICD-10-CM | POA: Diagnosis not present

## 2021-09-13 DIAGNOSIS — E559 Vitamin D deficiency, unspecified: Secondary | ICD-10-CM | POA: Diagnosis not present

## 2021-09-13 DIAGNOSIS — I10 Essential (primary) hypertension: Secondary | ICD-10-CM | POA: Diagnosis not present

## 2021-09-13 DIAGNOSIS — G629 Polyneuropathy, unspecified: Secondary | ICD-10-CM | POA: Diagnosis not present

## 2021-09-13 DIAGNOSIS — J849 Interstitial pulmonary disease, unspecified: Secondary | ICD-10-CM | POA: Diagnosis not present

## 2021-09-13 DIAGNOSIS — D649 Anemia, unspecified: Secondary | ICD-10-CM | POA: Diagnosis not present

## 2021-09-13 DIAGNOSIS — E538 Deficiency of other specified B group vitamins: Secondary | ICD-10-CM | POA: Diagnosis not present

## 2021-09-13 DIAGNOSIS — I4891 Unspecified atrial fibrillation: Secondary | ICD-10-CM | POA: Diagnosis not present

## 2021-09-13 DIAGNOSIS — G309 Alzheimer's disease, unspecified: Secondary | ICD-10-CM | POA: Diagnosis not present

## 2021-09-13 DIAGNOSIS — R5381 Other malaise: Secondary | ICD-10-CM | POA: Diagnosis not present

## 2021-09-14 DIAGNOSIS — R350 Frequency of micturition: Secondary | ICD-10-CM | POA: Diagnosis not present

## 2021-09-14 DIAGNOSIS — I482 Chronic atrial fibrillation, unspecified: Secondary | ICD-10-CM | POA: Diagnosis not present

## 2021-09-18 DIAGNOSIS — D519 Vitamin B12 deficiency anemia, unspecified: Secondary | ICD-10-CM | POA: Diagnosis not present

## 2021-09-20 DIAGNOSIS — I4891 Unspecified atrial fibrillation: Secondary | ICD-10-CM | POA: Diagnosis not present

## 2021-09-20 DIAGNOSIS — E538 Deficiency of other specified B group vitamins: Secondary | ICD-10-CM | POA: Diagnosis not present

## 2021-09-20 DIAGNOSIS — I10 Essential (primary) hypertension: Secondary | ICD-10-CM | POA: Diagnosis not present

## 2021-09-20 DIAGNOSIS — R5381 Other malaise: Secondary | ICD-10-CM | POA: Diagnosis not present

## 2021-09-20 DIAGNOSIS — G309 Alzheimer's disease, unspecified: Secondary | ICD-10-CM | POA: Diagnosis not present

## 2021-09-20 DIAGNOSIS — G629 Polyneuropathy, unspecified: Secondary | ICD-10-CM | POA: Diagnosis not present

## 2021-09-20 DIAGNOSIS — E559 Vitamin D deficiency, unspecified: Secondary | ICD-10-CM | POA: Diagnosis not present

## 2021-09-20 DIAGNOSIS — D649 Anemia, unspecified: Secondary | ICD-10-CM | POA: Diagnosis not present

## 2021-09-20 DIAGNOSIS — J849 Interstitial pulmonary disease, unspecified: Secondary | ICD-10-CM | POA: Diagnosis not present

## 2021-09-21 DIAGNOSIS — I482 Chronic atrial fibrillation, unspecified: Secondary | ICD-10-CM | POA: Diagnosis not present

## 2021-09-27 DIAGNOSIS — D649 Anemia, unspecified: Secondary | ICD-10-CM | POA: Diagnosis not present

## 2021-09-27 DIAGNOSIS — R5381 Other malaise: Secondary | ICD-10-CM | POA: Diagnosis not present

## 2021-09-27 DIAGNOSIS — J849 Interstitial pulmonary disease, unspecified: Secondary | ICD-10-CM | POA: Diagnosis not present

## 2021-09-27 DIAGNOSIS — E538 Deficiency of other specified B group vitamins: Secondary | ICD-10-CM | POA: Diagnosis not present

## 2021-09-27 DIAGNOSIS — G629 Polyneuropathy, unspecified: Secondary | ICD-10-CM | POA: Diagnosis not present

## 2021-09-27 DIAGNOSIS — I10 Essential (primary) hypertension: Secondary | ICD-10-CM | POA: Diagnosis not present

## 2021-09-27 DIAGNOSIS — E559 Vitamin D deficiency, unspecified: Secondary | ICD-10-CM | POA: Diagnosis not present

## 2021-09-27 DIAGNOSIS — I4891 Unspecified atrial fibrillation: Secondary | ICD-10-CM | POA: Diagnosis not present

## 2021-09-28 DIAGNOSIS — Z7901 Long term (current) use of anticoagulants: Secondary | ICD-10-CM | POA: Diagnosis not present

## 2021-10-02 DIAGNOSIS — Z7901 Long term (current) use of anticoagulants: Secondary | ICD-10-CM | POA: Diagnosis not present

## 2021-10-02 DIAGNOSIS — I4891 Unspecified atrial fibrillation: Secondary | ICD-10-CM | POA: Diagnosis not present

## 2021-10-05 DIAGNOSIS — G547 Phantom limb syndrome without pain: Secondary | ICD-10-CM | POA: Diagnosis not present

## 2021-10-05 DIAGNOSIS — D649 Anemia, unspecified: Secondary | ICD-10-CM | POA: Diagnosis not present

## 2021-10-05 DIAGNOSIS — G629 Polyneuropathy, unspecified: Secondary | ICD-10-CM | POA: Diagnosis not present

## 2021-10-05 DIAGNOSIS — R5381 Other malaise: Secondary | ICD-10-CM | POA: Diagnosis not present

## 2021-10-05 DIAGNOSIS — E559 Vitamin D deficiency, unspecified: Secondary | ICD-10-CM | POA: Diagnosis not present

## 2021-10-09 ENCOUNTER — Telehealth: Payer: Self-pay | Admitting: Licensed Clinical Social Worker

## 2021-10-09 ENCOUNTER — Telehealth: Payer: Self-pay | Admitting: Family Medicine

## 2021-10-09 ENCOUNTER — Telehealth: Payer: PPO

## 2021-10-09 NOTE — Telephone Encounter (Signed)
[name], ? ?Will you make sure none of his meds would be causing? Then I will call. ?

## 2021-10-09 NOTE — Telephone Encounter (Signed)
[name] informed and will contact nurses to make them aware of [name]'s recommendations. ?

## 2021-10-09 NOTE — Telephone Encounter (Signed)
?  Chronic Care Management  ?  ? Clinical Social Work Note ?  ?10/09/21 ?Name: Cypher Paule.       MRN: [id number]       DOB: 04-Jan-1941 ?  ?Tison Leibold. is a 81 y.o. year old male who is a primary care patient of [name], [name], MD. The CCM team was consulted to assist the patient with chronic disease management and/or care coordination needs related to: [name] .  ?  ?Unsuccessful call today to [facility], significant other to client. Answering machine for [facility] was not set up and thus LCSW could not leave phone message for [facility]. ? ?Follow Up Plan:  LCSW to call [name] on 11/30/21 at 1:00 PM to assess needs of client at that time ? ?Norva Riffle.Placida Cambre MSW, LCSW ?Licensed Clinical Social Worker ?[name] Management ?[phone] ?

## 2021-10-09 NOTE — Telephone Encounter (Signed)
Yes I do not think that his medicines are causing this except for maybe the blood thinner does thin out his blood a little bit.  It is likely because he is having difficulties with thinner skin and not eating sufficient protein.  Encourage increasing protein intake and moisturizing of the skin which should help some.  Watch for weight loss as well and make sure that he is maintaining his weight with his current appetite and meal plan ?

## 2021-10-12 DIAGNOSIS — F3342 Major depressive disorder, recurrent, in full remission: Secondary | ICD-10-CM | POA: Diagnosis not present

## 2021-10-12 DIAGNOSIS — Z89611 Acquired absence of right leg above knee: Secondary | ICD-10-CM | POA: Diagnosis not present

## 2021-10-12 DIAGNOSIS — Z9713 Presence of artificial right leg (complete) (partial): Secondary | ICD-10-CM | POA: Diagnosis not present

## 2021-10-12 DIAGNOSIS — B351 Tinea unguium: Secondary | ICD-10-CM | POA: Diagnosis not present

## 2021-10-12 DIAGNOSIS — I4891 Unspecified atrial fibrillation: Secondary | ICD-10-CM | POA: Diagnosis not present

## 2021-10-12 DIAGNOSIS — E46 Unspecified protein-calorie malnutrition: Secondary | ICD-10-CM | POA: Diagnosis not present

## 2021-10-12 DIAGNOSIS — I7091 Generalized atherosclerosis: Secondary | ICD-10-CM | POA: Diagnosis not present

## 2021-10-12 DIAGNOSIS — G546 Phantom limb syndrome with pain: Secondary | ICD-10-CM | POA: Diagnosis not present

## 2021-10-12 DIAGNOSIS — Z515 Encounter for palliative care: Secondary | ICD-10-CM | POA: Diagnosis not present

## 2021-10-12 DIAGNOSIS — F0280 Dementia in other diseases classified elsewhere without behavioral disturbance: Secondary | ICD-10-CM | POA: Diagnosis not present

## 2021-10-12 DIAGNOSIS — E1159 Type 2 diabetes mellitus with other circulatory complications: Secondary | ICD-10-CM | POA: Diagnosis not present

## 2021-10-12 DIAGNOSIS — Z89511 Acquired absence of right leg below knee: Secondary | ICD-10-CM | POA: Diagnosis not present

## 2021-10-12 DIAGNOSIS — G309 Alzheimer's disease, unspecified: Secondary | ICD-10-CM | POA: Diagnosis not present

## 2021-10-12 DIAGNOSIS — Z7901 Long term (current) use of anticoagulants: Secondary | ICD-10-CM | POA: Diagnosis not present

## 2021-10-16 DIAGNOSIS — I482 Chronic atrial fibrillation, unspecified: Secondary | ICD-10-CM | POA: Diagnosis not present

## 2021-10-18 DIAGNOSIS — J849 Interstitial pulmonary disease, unspecified: Secondary | ICD-10-CM | POA: Diagnosis not present

## 2021-10-18 DIAGNOSIS — G547 Phantom limb syndrome without pain: Secondary | ICD-10-CM | POA: Diagnosis not present

## 2021-10-18 DIAGNOSIS — E559 Vitamin D deficiency, unspecified: Secondary | ICD-10-CM | POA: Diagnosis not present

## 2021-10-18 DIAGNOSIS — D649 Anemia, unspecified: Secondary | ICD-10-CM | POA: Diagnosis not present

## 2021-10-18 DIAGNOSIS — I4891 Unspecified atrial fibrillation: Secondary | ICD-10-CM | POA: Diagnosis not present

## 2021-10-18 DIAGNOSIS — I10 Essential (primary) hypertension: Secondary | ICD-10-CM | POA: Diagnosis not present

## 2021-10-18 DIAGNOSIS — F0280 Dementia in other diseases classified elsewhere without behavioral disturbance: Secondary | ICD-10-CM | POA: Diagnosis not present

## 2021-10-18 DIAGNOSIS — R5381 Other malaise: Secondary | ICD-10-CM | POA: Diagnosis not present

## 2021-10-19 DIAGNOSIS — E642 Sequelae of vitamin C deficiency: Secondary | ICD-10-CM | POA: Diagnosis not present

## 2021-10-19 DIAGNOSIS — R7989 Other specified abnormal findings of blood chemistry: Secondary | ICD-10-CM | POA: Diagnosis not present

## 2021-10-19 DIAGNOSIS — E559 Vitamin D deficiency, unspecified: Secondary | ICD-10-CM | POA: Diagnosis not present

## 2021-10-19 DIAGNOSIS — I10 Essential (primary) hypertension: Secondary | ICD-10-CM | POA: Diagnosis not present

## 2021-10-23 DIAGNOSIS — I482 Chronic atrial fibrillation, unspecified: Secondary | ICD-10-CM | POA: Diagnosis not present

## 2021-10-24 DIAGNOSIS — R7989 Other specified abnormal findings of blood chemistry: Secondary | ICD-10-CM | POA: Diagnosis not present

## 2021-10-24 DIAGNOSIS — I10 Essential (primary) hypertension: Secondary | ICD-10-CM | POA: Diagnosis not present

## 2021-10-25 DIAGNOSIS — E538 Deficiency of other specified B group vitamins: Secondary | ICD-10-CM | POA: Diagnosis not present

## 2021-10-25 DIAGNOSIS — I4891 Unspecified atrial fibrillation: Secondary | ICD-10-CM | POA: Diagnosis not present

## 2021-10-25 DIAGNOSIS — I10 Essential (primary) hypertension: Secondary | ICD-10-CM | POA: Diagnosis not present

## 2021-10-25 DIAGNOSIS — J849 Interstitial pulmonary disease, unspecified: Secondary | ICD-10-CM | POA: Diagnosis not present

## 2021-10-25 DIAGNOSIS — D649 Anemia, unspecified: Secondary | ICD-10-CM | POA: Diagnosis not present

## 2021-10-25 DIAGNOSIS — R5381 Other malaise: Secondary | ICD-10-CM | POA: Diagnosis not present

## 2021-10-25 DIAGNOSIS — E559 Vitamin D deficiency, unspecified: Secondary | ICD-10-CM | POA: Diagnosis not present

## 2021-10-25 DIAGNOSIS — G547 Phantom limb syndrome without pain: Secondary | ICD-10-CM | POA: Diagnosis not present

## 2021-10-25 DIAGNOSIS — G629 Polyneuropathy, unspecified: Secondary | ICD-10-CM | POA: Diagnosis not present

## 2021-10-25 DIAGNOSIS — E039 Hypothyroidism, unspecified: Secondary | ICD-10-CM | POA: Diagnosis not present

## 2021-10-25 DIAGNOSIS — G309 Alzheimer's disease, unspecified: Secondary | ICD-10-CM | POA: Diagnosis not present

## 2021-10-26 ENCOUNTER — Telehealth: Payer: Self-pay | Admitting: Family Medicine

## 2021-10-26 DIAGNOSIS — E038 Other specified hypothyroidism: Secondary | ICD-10-CM

## 2021-10-26 NOTE — Telephone Encounter (Signed)
Tell them to diagnose low thyroid it we need at least 2 values that are in that range, please have her recheck it in 1 to 2 months, this time check a full thyroid panel including TSH free T4 and T3 ?

## 2021-10-27 DIAGNOSIS — R7989 Other specified abnormal findings of blood chemistry: Secondary | ICD-10-CM | POA: Diagnosis not present

## 2021-10-27 NOTE — Telephone Encounter (Signed)
Wife informed of [name]'s recommendations. Labs ordered and faxed to [facility] to draw. ? ?Fax [phone] ?

## 2021-10-30 ENCOUNTER — Telehealth: Payer: Self-pay | Admitting: Family Medicine

## 2021-10-30 DIAGNOSIS — Z7901 Long term (current) use of anticoagulants: Secondary | ICD-10-CM | POA: Diagnosis not present

## 2021-10-30 NOTE — Telephone Encounter (Signed)
Left message informing Brett Pearson of Dr. Merita Norton recommendations and to call back with any concerns. ?

## 2021-10-30 NOTE — Telephone Encounter (Signed)
Yes let me know on the ear thing what they say if it is causing any problems. ?Have him keep an eye on his blood pressure and heart rate but if they are both running well then I am okay with stopping the metoprolol for now because he was going kind of low before. ? ?Let me know how the recheck on the thyroid goes ?

## 2021-11-02 DIAGNOSIS — E559 Vitamin D deficiency, unspecified: Secondary | ICD-10-CM | POA: Diagnosis not present

## 2021-11-02 DIAGNOSIS — R5381 Other malaise: Secondary | ICD-10-CM | POA: Diagnosis not present

## 2021-11-02 DIAGNOSIS — D649 Anemia, unspecified: Secondary | ICD-10-CM | POA: Diagnosis not present

## 2021-11-02 DIAGNOSIS — R6889 Other general symptoms and signs: Secondary | ICD-10-CM | POA: Diagnosis not present

## 2021-11-02 DIAGNOSIS — I10 Essential (primary) hypertension: Secondary | ICD-10-CM | POA: Diagnosis not present

## 2021-11-02 DIAGNOSIS — G629 Polyneuropathy, unspecified: Secondary | ICD-10-CM | POA: Diagnosis not present

## 2021-11-02 DIAGNOSIS — E039 Hypothyroidism, unspecified: Secondary | ICD-10-CM | POA: Diagnosis not present

## 2021-11-02 DIAGNOSIS — E538 Deficiency of other specified B group vitamins: Secondary | ICD-10-CM | POA: Diagnosis not present

## 2021-11-02 DIAGNOSIS — J849 Interstitial pulmonary disease, unspecified: Secondary | ICD-10-CM | POA: Diagnosis not present

## 2021-11-02 DIAGNOSIS — I4891 Unspecified atrial fibrillation: Secondary | ICD-10-CM | POA: Diagnosis not present

## 2021-11-06 DIAGNOSIS — I482 Chronic atrial fibrillation, unspecified: Secondary | ICD-10-CM | POA: Diagnosis not present

## 2021-11-07 ENCOUNTER — Telehealth: Payer: Self-pay | Admitting: Family Medicine

## 2021-11-08 DIAGNOSIS — E119 Type 2 diabetes mellitus without complications: Secondary | ICD-10-CM | POA: Diagnosis not present

## 2021-11-08 NOTE — Telephone Encounter (Signed)
I do not know that it would be warranted or good to consistently check his urine like that, if his memory or anything changes on a consistent basis then we can check it but he is likely a carrier and we would likely be overtreating if we checked his urine consistently like that.  He would develop antibiotic resistances. ?

## 2021-11-08 NOTE — Telephone Encounter (Signed)
Pts wife has been informed and understood. She will call if needed. ?

## 2021-11-13 DIAGNOSIS — Z7901 Long term (current) use of anticoagulants: Secondary | ICD-10-CM | POA: Diagnosis not present

## 2021-11-15 ENCOUNTER — Telehealth: Payer: Self-pay | Admitting: Family Medicine

## 2021-11-15 NOTE — Telephone Encounter (Signed)
Pt. Needs to be seen for this.There are treatments available. ?Thanks, ?WS ?

## 2021-11-15 NOTE — Telephone Encounter (Signed)
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 Says she knows there is no cure for it but wants to know if there is anything that can be done to help with it? ?

## 2021-11-27 DIAGNOSIS — I4891 Unspecified atrial fibrillation: Secondary | ICD-10-CM | POA: Diagnosis not present

## 2021-11-29 DIAGNOSIS — J849 Interstitial pulmonary disease, unspecified: Secondary | ICD-10-CM | POA: Diagnosis not present

## 2021-11-29 DIAGNOSIS — H6123 Impacted cerumen, bilateral: Secondary | ICD-10-CM | POA: Diagnosis not present

## 2021-11-29 DIAGNOSIS — R5381 Other malaise: Secondary | ICD-10-CM | POA: Diagnosis not present

## 2021-11-29 DIAGNOSIS — I10 Essential (primary) hypertension: Secondary | ICD-10-CM | POA: Diagnosis not present

## 2021-11-29 DIAGNOSIS — E039 Hypothyroidism, unspecified: Secondary | ICD-10-CM | POA: Diagnosis not present

## 2021-11-29 DIAGNOSIS — F0280 Dementia in other diseases classified elsewhere without behavioral disturbance: Secondary | ICD-10-CM | POA: Diagnosis not present

## 2021-11-29 DIAGNOSIS — E559 Vitamin D deficiency, unspecified: Secondary | ICD-10-CM | POA: Diagnosis not present

## 2021-11-29 DIAGNOSIS — K7460 Unspecified cirrhosis of liver: Secondary | ICD-10-CM | POA: Diagnosis not present

## 2021-11-29 DIAGNOSIS — D649 Anemia, unspecified: Secondary | ICD-10-CM | POA: Diagnosis not present

## 2021-11-29 DIAGNOSIS — I4891 Unspecified atrial fibrillation: Secondary | ICD-10-CM | POA: Diagnosis not present

## 2021-11-30 ENCOUNTER — Telehealth: Payer: PPO

## 2021-12-01 DIAGNOSIS — I482 Chronic atrial fibrillation, unspecified: Secondary | ICD-10-CM | POA: Diagnosis not present

## 2021-12-05 DIAGNOSIS — E785 Hyperlipidemia, unspecified: Secondary | ICD-10-CM | POA: Diagnosis not present

## 2021-12-05 DIAGNOSIS — E559 Vitamin D deficiency, unspecified: Secondary | ICD-10-CM | POA: Diagnosis not present

## 2021-12-05 DIAGNOSIS — I10 Essential (primary) hypertension: Secondary | ICD-10-CM | POA: Diagnosis not present

## 2021-12-06 DIAGNOSIS — E0590 Thyrotoxicosis, unspecified without thyrotoxic crisis or storm: Secondary | ICD-10-CM | POA: Diagnosis not present

## 2021-12-08 DIAGNOSIS — G309 Alzheimer's disease, unspecified: Secondary | ICD-10-CM | POA: Diagnosis not present

## 2021-12-08 DIAGNOSIS — I10 Essential (primary) hypertension: Secondary | ICD-10-CM | POA: Diagnosis not present

## 2021-12-08 DIAGNOSIS — I4891 Unspecified atrial fibrillation: Secondary | ICD-10-CM | POA: Diagnosis not present

## 2021-12-08 DIAGNOSIS — E559 Vitamin D deficiency, unspecified: Secondary | ICD-10-CM | POA: Diagnosis not present

## 2021-12-08 DIAGNOSIS — E039 Hypothyroidism, unspecified: Secondary | ICD-10-CM | POA: Diagnosis not present

## 2021-12-08 DIAGNOSIS — Z7901 Long term (current) use of anticoagulants: Secondary | ICD-10-CM | POA: Diagnosis not present

## 2021-12-11 DIAGNOSIS — Z7901 Long term (current) use of anticoagulants: Secondary | ICD-10-CM | POA: Diagnosis not present

## 2021-12-13 DIAGNOSIS — E559 Vitamin D deficiency, unspecified: Secondary | ICD-10-CM | POA: Diagnosis not present

## 2021-12-13 DIAGNOSIS — E538 Deficiency of other specified B group vitamins: Secondary | ICD-10-CM | POA: Diagnosis not present

## 2021-12-13 DIAGNOSIS — G547 Phantom limb syndrome without pain: Secondary | ICD-10-CM | POA: Diagnosis not present

## 2021-12-13 DIAGNOSIS — E039 Hypothyroidism, unspecified: Secondary | ICD-10-CM | POA: Diagnosis not present

## 2021-12-13 DIAGNOSIS — I4891 Unspecified atrial fibrillation: Secondary | ICD-10-CM | POA: Diagnosis not present

## 2021-12-13 DIAGNOSIS — I10 Essential (primary) hypertension: Secondary | ICD-10-CM | POA: Diagnosis not present

## 2021-12-13 DIAGNOSIS — G309 Alzheimer's disease, unspecified: Secondary | ICD-10-CM | POA: Diagnosis not present

## 2021-12-13 DIAGNOSIS — R5381 Other malaise: Secondary | ICD-10-CM | POA: Diagnosis not present

## 2021-12-13 DIAGNOSIS — J849 Interstitial pulmonary disease, unspecified: Secondary | ICD-10-CM | POA: Diagnosis not present

## 2021-12-13 DIAGNOSIS — H6122 Impacted cerumen, left ear: Secondary | ICD-10-CM | POA: Diagnosis not present

## 2021-12-13 DIAGNOSIS — D649 Anemia, unspecified: Secondary | ICD-10-CM | POA: Diagnosis not present

## 2021-12-14 DIAGNOSIS — E039 Hypothyroidism, unspecified: Secondary | ICD-10-CM | POA: Diagnosis not present

## 2021-12-14 DIAGNOSIS — G309 Alzheimer's disease, unspecified: Secondary | ICD-10-CM | POA: Diagnosis not present

## 2021-12-14 DIAGNOSIS — D649 Anemia, unspecified: Secondary | ICD-10-CM | POA: Diagnosis not present

## 2021-12-14 DIAGNOSIS — I4891 Unspecified atrial fibrillation: Secondary | ICD-10-CM | POA: Diagnosis not present

## 2021-12-14 DIAGNOSIS — E559 Vitamin D deficiency, unspecified: Secondary | ICD-10-CM | POA: Diagnosis not present

## 2021-12-14 DIAGNOSIS — H6122 Impacted cerumen, left ear: Secondary | ICD-10-CM | POA: Diagnosis not present

## 2021-12-14 DIAGNOSIS — E538 Deficiency of other specified B group vitamins: Secondary | ICD-10-CM | POA: Diagnosis not present

## 2021-12-15 DIAGNOSIS — Z7901 Long term (current) use of anticoagulants: Secondary | ICD-10-CM | POA: Diagnosis not present

## 2021-12-22 DIAGNOSIS — Z7901 Long term (current) use of anticoagulants: Secondary | ICD-10-CM | POA: Diagnosis not present

## 2021-12-25 DIAGNOSIS — I7091 Generalized atherosclerosis: Secondary | ICD-10-CM | POA: Diagnosis not present

## 2021-12-25 DIAGNOSIS — B351 Tinea unguium: Secondary | ICD-10-CM | POA: Diagnosis not present

## 2021-12-27 DIAGNOSIS — F0280 Dementia in other diseases classified elsewhere without behavioral disturbance: Secondary | ICD-10-CM | POA: Diagnosis not present

## 2021-12-27 DIAGNOSIS — I10 Essential (primary) hypertension: Secondary | ICD-10-CM | POA: Diagnosis not present

## 2021-12-27 DIAGNOSIS — G547 Phantom limb syndrome without pain: Secondary | ICD-10-CM | POA: Diagnosis not present

## 2021-12-27 DIAGNOSIS — R5381 Other malaise: Secondary | ICD-10-CM | POA: Diagnosis not present

## 2021-12-27 DIAGNOSIS — D649 Anemia, unspecified: Secondary | ICD-10-CM | POA: Diagnosis not present

## 2021-12-27 DIAGNOSIS — E039 Hypothyroidism, unspecified: Secondary | ICD-10-CM | POA: Diagnosis not present

## 2021-12-27 DIAGNOSIS — I4891 Unspecified atrial fibrillation: Secondary | ICD-10-CM | POA: Diagnosis not present

## 2021-12-27 DIAGNOSIS — G629 Polyneuropathy, unspecified: Secondary | ICD-10-CM | POA: Diagnosis not present

## 2021-12-27 DIAGNOSIS — G309 Alzheimer's disease, unspecified: Secondary | ICD-10-CM | POA: Diagnosis not present

## 2021-12-27 DIAGNOSIS — H9190 Unspecified hearing loss, unspecified ear: Secondary | ICD-10-CM | POA: Diagnosis not present

## 2021-12-27 DIAGNOSIS — E559 Vitamin D deficiency, unspecified: Secondary | ICD-10-CM | POA: Diagnosis not present

## 2021-12-29 DIAGNOSIS — I4891 Unspecified atrial fibrillation: Secondary | ICD-10-CM | POA: Diagnosis not present

## 2022-01-03 DIAGNOSIS — Z7901 Long term (current) use of anticoagulants: Secondary | ICD-10-CM | POA: Diagnosis not present

## 2022-01-10 DIAGNOSIS — Z7901 Long term (current) use of anticoagulants: Secondary | ICD-10-CM | POA: Diagnosis not present

## 2022-01-11 DIAGNOSIS — J849 Interstitial pulmonary disease, unspecified: Secondary | ICD-10-CM | POA: Diagnosis not present

## 2022-01-11 DIAGNOSIS — E559 Vitamin D deficiency, unspecified: Secondary | ICD-10-CM | POA: Diagnosis not present

## 2022-01-11 DIAGNOSIS — H9190 Unspecified hearing loss, unspecified ear: Secondary | ICD-10-CM | POA: Diagnosis not present

## 2022-01-11 DIAGNOSIS — D649 Anemia, unspecified: Secondary | ICD-10-CM | POA: Diagnosis not present

## 2022-01-11 DIAGNOSIS — R5381 Other malaise: Secondary | ICD-10-CM | POA: Diagnosis not present

## 2022-01-11 DIAGNOSIS — F0280 Dementia in other diseases classified elsewhere without behavioral disturbance: Secondary | ICD-10-CM | POA: Diagnosis not present

## 2022-01-11 DIAGNOSIS — E039 Hypothyroidism, unspecified: Secondary | ICD-10-CM | POA: Diagnosis not present

## 2022-01-15 DIAGNOSIS — I10 Essential (primary) hypertension: Secondary | ICD-10-CM | POA: Diagnosis not present

## 2022-01-17 DIAGNOSIS — I4891 Unspecified atrial fibrillation: Secondary | ICD-10-CM | POA: Diagnosis not present

## 2022-01-20 DIAGNOSIS — Z7901 Long term (current) use of anticoagulants: Secondary | ICD-10-CM | POA: Diagnosis not present

## 2022-01-21 DIAGNOSIS — I4891 Unspecified atrial fibrillation: Secondary | ICD-10-CM | POA: Diagnosis not present

## 2022-01-24 DIAGNOSIS — E559 Vitamin D deficiency, unspecified: Secondary | ICD-10-CM | POA: Diagnosis not present

## 2022-01-24 DIAGNOSIS — H9190 Unspecified hearing loss, unspecified ear: Secondary | ICD-10-CM | POA: Diagnosis not present

## 2022-01-24 DIAGNOSIS — I10 Essential (primary) hypertension: Secondary | ICD-10-CM | POA: Diagnosis not present

## 2022-01-24 DIAGNOSIS — I4891 Unspecified atrial fibrillation: Secondary | ICD-10-CM | POA: Diagnosis not present

## 2022-01-24 DIAGNOSIS — G547 Phantom limb syndrome without pain: Secondary | ICD-10-CM | POA: Diagnosis not present

## 2022-01-24 DIAGNOSIS — D649 Anemia, unspecified: Secondary | ICD-10-CM | POA: Diagnosis not present

## 2022-01-24 DIAGNOSIS — G309 Alzheimer's disease, unspecified: Secondary | ICD-10-CM | POA: Diagnosis not present

## 2022-01-24 DIAGNOSIS — E039 Hypothyroidism, unspecified: Secondary | ICD-10-CM | POA: Diagnosis not present

## 2022-01-26 ENCOUNTER — Ambulatory Visit: Payer: PPO

## 2022-01-27 DIAGNOSIS — I4891 Unspecified atrial fibrillation: Secondary | ICD-10-CM | POA: Diagnosis not present

## 2022-01-27 DIAGNOSIS — Z5181 Encounter for therapeutic drug level monitoring: Secondary | ICD-10-CM | POA: Diagnosis not present

## 2022-01-31 DIAGNOSIS — I10 Essential (primary) hypertension: Secondary | ICD-10-CM | POA: Diagnosis not present

## 2022-02-03 DIAGNOSIS — Z7901 Long term (current) use of anticoagulants: Secondary | ICD-10-CM | POA: Diagnosis not present

## 2022-02-07 DIAGNOSIS — I4891 Unspecified atrial fibrillation: Secondary | ICD-10-CM | POA: Diagnosis not present

## 2022-02-08 DIAGNOSIS — I10 Essential (primary) hypertension: Secondary | ICD-10-CM | POA: Diagnosis not present

## 2022-02-08 DIAGNOSIS — E039 Hypothyroidism, unspecified: Secondary | ICD-10-CM | POA: Diagnosis not present

## 2022-02-08 DIAGNOSIS — G309 Alzheimer's disease, unspecified: Secondary | ICD-10-CM | POA: Diagnosis not present

## 2022-02-08 DIAGNOSIS — I4891 Unspecified atrial fibrillation: Secondary | ICD-10-CM | POA: Diagnosis not present

## 2022-02-08 DIAGNOSIS — J849 Interstitial pulmonary disease, unspecified: Secondary | ICD-10-CM | POA: Diagnosis not present

## 2022-02-08 DIAGNOSIS — K7460 Unspecified cirrhosis of liver: Secondary | ICD-10-CM | POA: Diagnosis not present

## 2022-02-14 ENCOUNTER — Telehealth: Payer: Self-pay | Admitting: Family Medicine

## 2022-02-14 DIAGNOSIS — J849 Interstitial pulmonary disease, unspecified: Secondary | ICD-10-CM | POA: Diagnosis not present

## 2022-02-14 DIAGNOSIS — E039 Hypothyroidism, unspecified: Secondary | ICD-10-CM | POA: Diagnosis not present

## 2022-02-14 DIAGNOSIS — G309 Alzheimer's disease, unspecified: Secondary | ICD-10-CM | POA: Diagnosis not present

## 2022-02-14 DIAGNOSIS — G629 Polyneuropathy, unspecified: Secondary | ICD-10-CM | POA: Diagnosis not present

## 2022-02-14 DIAGNOSIS — U071 COVID-19: Secondary | ICD-10-CM | POA: Diagnosis not present

## 2022-02-14 DIAGNOSIS — I4891 Unspecified atrial fibrillation: Secondary | ICD-10-CM | POA: Diagnosis not present

## 2022-02-14 DIAGNOSIS — D649 Anemia, unspecified: Secondary | ICD-10-CM | POA: Diagnosis not present

## 2022-02-14 DIAGNOSIS — E559 Vitamin D deficiency, unspecified: Secondary | ICD-10-CM | POA: Diagnosis not present

## 2022-02-14 DIAGNOSIS — I10 Essential (primary) hypertension: Secondary | ICD-10-CM | POA: Diagnosis not present

## 2022-02-14 DIAGNOSIS — E538 Deficiency of other specified B group vitamins: Secondary | ICD-10-CM | POA: Diagnosis not present

## 2022-02-14 NOTE — Telephone Encounter (Signed)
FYI, they are starting him on Paxlovid today.  She is going to see him late today.

## 2022-02-14 NOTE — Telephone Encounter (Signed)
We will good that they are starting Paxlovid, hopefully he recovers well from it and let us know if they need anything in the meantime.

## 2022-02-16 ENCOUNTER — Telehealth: Payer: Self-pay | Admitting: Family Medicine

## 2022-02-16 DIAGNOSIS — U071 COVID-19: Secondary | ICD-10-CM | POA: Diagnosis not present

## 2022-02-19 NOTE — Telephone Encounter (Signed)
Wife coming in today for OV with [name]. Will discuss during that time.

## 2022-02-21 DIAGNOSIS — G629 Polyneuropathy, unspecified: Secondary | ICD-10-CM | POA: Diagnosis not present

## 2022-02-21 DIAGNOSIS — R0989 Other specified symptoms and signs involving the circulatory and respiratory systems: Secondary | ICD-10-CM | POA: Diagnosis not present

## 2022-02-21 DIAGNOSIS — J849 Interstitial pulmonary disease, unspecified: Secondary | ICD-10-CM | POA: Diagnosis not present

## 2022-02-21 DIAGNOSIS — E039 Hypothyroidism, unspecified: Secondary | ICD-10-CM | POA: Diagnosis not present

## 2022-02-21 DIAGNOSIS — E559 Vitamin D deficiency, unspecified: Secondary | ICD-10-CM | POA: Diagnosis not present

## 2022-02-21 DIAGNOSIS — U071 COVID-19: Secondary | ICD-10-CM | POA: Diagnosis not present

## 2022-02-21 DIAGNOSIS — G547 Phantom limb syndrome without pain: Secondary | ICD-10-CM | POA: Diagnosis not present

## 2022-02-21 DIAGNOSIS — G309 Alzheimer's disease, unspecified: Secondary | ICD-10-CM | POA: Diagnosis not present

## 2022-02-21 DIAGNOSIS — E538 Deficiency of other specified B group vitamins: Secondary | ICD-10-CM | POA: Diagnosis not present

## 2022-02-21 DIAGNOSIS — I4891 Unspecified atrial fibrillation: Secondary | ICD-10-CM | POA: Diagnosis not present

## 2022-02-22 DIAGNOSIS — R0989 Other specified symptoms and signs involving the circulatory and respiratory systems: Secondary | ICD-10-CM | POA: Diagnosis not present

## 2022-02-22 NOTE — Telephone Encounter (Signed)
I do not have this x-ray yet, has anybody seen it

## 2022-02-22 NOTE — Telephone Encounter (Signed)
[facility] has not received it yet. I checked with Home health and she does not have it.

## 2022-02-22 NOTE — Telephone Encounter (Signed)
Calling to let [name] know that patients condition is declining and she is getting x ray report faxed over. She wants to know if [name] thinks the patient needs to be hospitalized. Please  call back and advise.

## 2022-02-23 ENCOUNTER — Telehealth: Payer: Self-pay | Admitting: Family Medicine

## 2022-02-23 DIAGNOSIS — I10 Essential (primary) hypertension: Secondary | ICD-10-CM | POA: Diagnosis not present

## 2022-02-23 NOTE — Telephone Encounter (Signed)
No sounds like good treatments to me, I do not have the x-ray faxed to me yet and we checked yesterday on her fax machine so I cannot attest to the chest x-ray but the antibiotic is a good 1 and the fluid should help the congestion so I agree with those.  Rest and hydration are very important so it is okay if he stays in bed right now but after he gets over this to make sure he starts getting up and moving again

## 2022-02-23 NOTE — Telephone Encounter (Signed)
Left message for Brett Pearson to return call

## 2022-02-26 NOTE — Telephone Encounter (Signed)
Returning call. Please call back.

## 2022-03-01 DIAGNOSIS — J811 Chronic pulmonary edema: Secondary | ICD-10-CM | POA: Diagnosis not present

## 2022-03-02 DIAGNOSIS — R1312 Dysphagia, oropharyngeal phase: Secondary | ICD-10-CM | POA: Diagnosis not present

## 2022-03-05 DIAGNOSIS — R0989 Other specified symptoms and signs involving the circulatory and respiratory systems: Secondary | ICD-10-CM | POA: Diagnosis not present

## 2022-03-05 DIAGNOSIS — E559 Vitamin D deficiency, unspecified: Secondary | ICD-10-CM | POA: Diagnosis not present

## 2022-03-05 DIAGNOSIS — I10 Essential (primary) hypertension: Secondary | ICD-10-CM | POA: Diagnosis not present

## 2022-03-05 DIAGNOSIS — G547 Phantom limb syndrome without pain: Secondary | ICD-10-CM | POA: Diagnosis not present

## 2022-03-05 DIAGNOSIS — E039 Hypothyroidism, unspecified: Secondary | ICD-10-CM | POA: Diagnosis not present

## 2022-03-05 DIAGNOSIS — J849 Interstitial pulmonary disease, unspecified: Secondary | ICD-10-CM | POA: Diagnosis not present

## 2022-03-05 DIAGNOSIS — E538 Deficiency of other specified B group vitamins: Secondary | ICD-10-CM | POA: Diagnosis not present

## 2022-03-05 DIAGNOSIS — G629 Polyneuropathy, unspecified: Secondary | ICD-10-CM | POA: Diagnosis not present

## 2022-03-05 DIAGNOSIS — G309 Alzheimer's disease, unspecified: Secondary | ICD-10-CM | POA: Diagnosis not present

## 2022-03-05 DIAGNOSIS — K7460 Unspecified cirrhosis of liver: Secondary | ICD-10-CM | POA: Diagnosis not present

## 2022-03-05 DIAGNOSIS — I4891 Unspecified atrial fibrillation: Secondary | ICD-10-CM | POA: Diagnosis not present

## 2022-03-05 DIAGNOSIS — J189 Pneumonia, unspecified organism: Secondary | ICD-10-CM | POA: Diagnosis not present

## 2022-03-05 NOTE — Telephone Encounter (Signed)
I definitely do not have these x-rays yet but we will watch for the message

## 2022-03-05 NOTE — Telephone Encounter (Signed)
Spoke with Brett Pearson. She states that a PA has created a referral to cardio after multiple xrays. Pt has had a total of 4 now. She would like for [name] to review the xrays and explain.  Informed Brett Pearson that we do have an xray scanned in pts chart from 7/14 and that is it. Looked through Dr. Merita Norton desk and their are no xray reports for pt.  Brett Pearson states that she is going to call [name] and get the reports herself. She is upset with them because she has asked the numerous times to fax things to our office that we have not received.  She will call back when needed. Will route message to Dr. Warrick Parisian to make aware.

## 2022-03-05 NOTE — Telephone Encounter (Signed)
Brett Pearson wants to know why a cardiologist was referred after pt had an xray last week. Brett Pearson has not contacted facility to ask why. She says that the report should have been sent to [name]. Please call back.

## 2022-03-06 DIAGNOSIS — R1312 Dysphagia, oropharyngeal phase: Secondary | ICD-10-CM | POA: Diagnosis not present

## 2022-03-07 DIAGNOSIS — I4891 Unspecified atrial fibrillation: Secondary | ICD-10-CM | POA: Diagnosis not present

## 2022-03-08 DIAGNOSIS — G629 Polyneuropathy, unspecified: Secondary | ICD-10-CM | POA: Diagnosis not present

## 2022-03-08 DIAGNOSIS — G309 Alzheimer's disease, unspecified: Secondary | ICD-10-CM | POA: Diagnosis not present

## 2022-03-08 DIAGNOSIS — E039 Hypothyroidism, unspecified: Secondary | ICD-10-CM | POA: Diagnosis not present

## 2022-03-08 DIAGNOSIS — I4891 Unspecified atrial fibrillation: Secondary | ICD-10-CM | POA: Diagnosis not present

## 2022-03-08 DIAGNOSIS — J849 Interstitial pulmonary disease, unspecified: Secondary | ICD-10-CM | POA: Diagnosis not present

## 2022-03-08 DIAGNOSIS — D649 Anemia, unspecified: Secondary | ICD-10-CM | POA: Diagnosis not present

## 2022-03-08 DIAGNOSIS — E559 Vitamin D deficiency, unspecified: Secondary | ICD-10-CM | POA: Diagnosis not present

## 2022-03-08 DIAGNOSIS — I10 Essential (primary) hypertension: Secondary | ICD-10-CM | POA: Diagnosis not present

## 2022-03-08 DIAGNOSIS — R791 Abnormal coagulation profile: Secondary | ICD-10-CM | POA: Diagnosis not present

## 2022-03-08 LAB — PROTIME-INR

## 2022-03-09 ENCOUNTER — Telehealth: Payer: Self-pay | Admitting: Family Medicine

## 2022-03-09 NOTE — Telephone Encounter (Signed)
Yes that is the biggest thing, hold coumadin and then recheck. If not bleeding then nothing else is to be done for now.

## 2022-03-09 NOTE — Telephone Encounter (Signed)
Carole informed. She is asking if he needs vit k. Per [name] pt does not need vit k unless INR is over 10 or over 5 and bleeding. Per [name] pt is not bleeding. INR will be checked tomorrow.

## 2022-03-10 DIAGNOSIS — R791 Abnormal coagulation profile: Secondary | ICD-10-CM | POA: Diagnosis not present

## 2022-03-10 DIAGNOSIS — I4891 Unspecified atrial fibrillation: Secondary | ICD-10-CM | POA: Diagnosis not present

## 2022-03-13 DIAGNOSIS — I4891 Unspecified atrial fibrillation: Secondary | ICD-10-CM | POA: Diagnosis not present

## 2022-03-14 ENCOUNTER — Telehealth: Payer: Self-pay | Admitting: Family Medicine

## 2022-03-14 NOTE — Telephone Encounter (Signed)
Brett Pearson made aware and understood. States that [name] will be faxing a information/documentation sheet. Informed that we would watch for it.

## 2022-03-14 NOTE — Telephone Encounter (Signed)
Yes that sounds good, as long as the INR is below 2 then he can start the Eliquis.

## 2022-03-15 DIAGNOSIS — I482 Chronic atrial fibrillation, unspecified: Secondary | ICD-10-CM | POA: Diagnosis not present

## 2022-03-16 ENCOUNTER — Telehealth: Payer: Self-pay | Admitting: Family Medicine

## 2022-03-16 NOTE — Telephone Encounter (Signed)
Pts wife called to let pts PCP know that pts INR is 1.71 and says pt did start taking Eloquis.

## 2022-03-16 NOTE — Telephone Encounter (Signed)
Okay sounds good

## 2022-03-22 DIAGNOSIS — I4891 Unspecified atrial fibrillation: Secondary | ICD-10-CM | POA: Diagnosis not present

## 2022-03-22 DIAGNOSIS — R791 Abnormal coagulation profile: Secondary | ICD-10-CM | POA: Diagnosis not present

## 2022-03-22 DIAGNOSIS — K7460 Unspecified cirrhosis of liver: Secondary | ICD-10-CM | POA: Diagnosis not present

## 2022-03-22 DIAGNOSIS — G309 Alzheimer's disease, unspecified: Secondary | ICD-10-CM | POA: Diagnosis not present

## 2022-03-24 DIAGNOSIS — N390 Urinary tract infection, site not specified: Secondary | ICD-10-CM | POA: Diagnosis not present

## 2022-03-26 DIAGNOSIS — I4891 Unspecified atrial fibrillation: Secondary | ICD-10-CM | POA: Diagnosis not present

## 2022-03-26 DIAGNOSIS — E039 Hypothyroidism, unspecified: Secondary | ICD-10-CM | POA: Diagnosis not present

## 2022-03-26 DIAGNOSIS — E538 Deficiency of other specified B group vitamins: Secondary | ICD-10-CM | POA: Diagnosis not present

## 2022-03-26 DIAGNOSIS — G309 Alzheimer's disease, unspecified: Secondary | ICD-10-CM | POA: Diagnosis not present

## 2022-03-26 DIAGNOSIS — I10 Essential (primary) hypertension: Secondary | ICD-10-CM | POA: Diagnosis not present

## 2022-03-26 DIAGNOSIS — G547 Phantom limb syndrome without pain: Secondary | ICD-10-CM | POA: Diagnosis not present

## 2022-03-26 DIAGNOSIS — N390 Urinary tract infection, site not specified: Secondary | ICD-10-CM | POA: Diagnosis not present

## 2022-03-26 DIAGNOSIS — J849 Interstitial pulmonary disease, unspecified: Secondary | ICD-10-CM | POA: Diagnosis not present

## 2022-03-27 DIAGNOSIS — H6120 Impacted cerumen, unspecified ear: Secondary | ICD-10-CM | POA: Diagnosis not present

## 2022-03-27 DIAGNOSIS — L97521 Non-pressure chronic ulcer of other part of left foot limited to breakdown of skin: Secondary | ICD-10-CM | POA: Diagnosis not present

## 2022-03-27 DIAGNOSIS — Z79899 Other long term (current) drug therapy: Secondary | ICD-10-CM | POA: Diagnosis not present

## 2022-03-28 DIAGNOSIS — I4891 Unspecified atrial fibrillation: Secondary | ICD-10-CM | POA: Diagnosis not present

## 2022-03-28 DIAGNOSIS — G309 Alzheimer's disease, unspecified: Secondary | ICD-10-CM | POA: Diagnosis not present

## 2022-03-28 DIAGNOSIS — I10 Essential (primary) hypertension: Secondary | ICD-10-CM | POA: Diagnosis not present

## 2022-03-28 DIAGNOSIS — L308 Other specified dermatitis: Secondary | ICD-10-CM | POA: Diagnosis not present

## 2022-03-28 DIAGNOSIS — N390 Urinary tract infection, site not specified: Secondary | ICD-10-CM | POA: Diagnosis not present

## 2022-03-28 DIAGNOSIS — E559 Vitamin D deficiency, unspecified: Secondary | ICD-10-CM | POA: Diagnosis not present

## 2022-03-28 DIAGNOSIS — E039 Hypothyroidism, unspecified: Secondary | ICD-10-CM | POA: Diagnosis not present

## 2022-04-03 ENCOUNTER — Ambulatory Visit: Payer: PPO | Admitting: Cardiology

## 2022-04-06 DIAGNOSIS — M6281 Muscle weakness (generalized): Secondary | ICD-10-CM | POA: Diagnosis not present

## 2022-04-19 DIAGNOSIS — E559 Vitamin D deficiency, unspecified: Secondary | ICD-10-CM | POA: Diagnosis not present

## 2022-04-19 DIAGNOSIS — G309 Alzheimer's disease, unspecified: Secondary | ICD-10-CM | POA: Diagnosis not present

## 2022-04-19 DIAGNOSIS — J849 Interstitial pulmonary disease, unspecified: Secondary | ICD-10-CM | POA: Diagnosis not present

## 2022-04-19 DIAGNOSIS — I10 Essential (primary) hypertension: Secondary | ICD-10-CM | POA: Diagnosis not present

## 2022-04-19 DIAGNOSIS — G547 Phantom limb syndrome without pain: Secondary | ICD-10-CM | POA: Diagnosis not present

## 2022-04-19 DIAGNOSIS — E039 Hypothyroidism, unspecified: Secondary | ICD-10-CM | POA: Diagnosis not present

## 2022-04-19 DIAGNOSIS — K7460 Unspecified cirrhosis of liver: Secondary | ICD-10-CM | POA: Diagnosis not present

## 2022-04-19 DIAGNOSIS — E538 Deficiency of other specified B group vitamins: Secondary | ICD-10-CM | POA: Diagnosis not present

## 2022-04-19 DIAGNOSIS — D649 Anemia, unspecified: Secondary | ICD-10-CM | POA: Diagnosis not present

## 2022-04-19 DIAGNOSIS — G629 Polyneuropathy, unspecified: Secondary | ICD-10-CM | POA: Diagnosis not present

## 2022-04-19 DIAGNOSIS — L308 Other specified dermatitis: Secondary | ICD-10-CM | POA: Diagnosis not present

## 2022-04-19 DIAGNOSIS — I4891 Unspecified atrial fibrillation: Secondary | ICD-10-CM | POA: Diagnosis not present

## 2022-04-23 ENCOUNTER — Emergency Department (HOSPITAL_COMMUNITY)
Admission: EM | Admit: 2022-04-23 | Discharge: 2022-04-23 | Disposition: A | Discharge status: Home / Self Care | Payer: PPO | Attending: Emergency Medicine | Admitting: Emergency Medicine

## 2022-04-23 ENCOUNTER — Emergency Department (HOSPITAL_COMMUNITY): Payer: PPO

## 2022-04-23 DIAGNOSIS — R410 Disorientation, unspecified: Secondary | ICD-10-CM | POA: Diagnosis not present

## 2022-04-23 DIAGNOSIS — E538 Deficiency of other specified B group vitamins: Secondary | ICD-10-CM | POA: Diagnosis not present

## 2022-04-23 DIAGNOSIS — Z7901 Long term (current) use of anticoagulants: Secondary | ICD-10-CM | POA: Diagnosis not present

## 2022-04-23 DIAGNOSIS — E039 Hypothyroidism, unspecified: Secondary | ICD-10-CM | POA: Diagnosis not present

## 2022-04-23 DIAGNOSIS — G629 Polyneuropathy, unspecified: Secondary | ICD-10-CM | POA: Diagnosis not present

## 2022-04-23 DIAGNOSIS — R5381 Other malaise: Secondary | ICD-10-CM | POA: Diagnosis not present

## 2022-04-23 DIAGNOSIS — L97921 Non-pressure chronic ulcer of unspecified part of left lower leg limited to breakdown of skin: Secondary | ICD-10-CM | POA: Insufficient documentation

## 2022-04-23 DIAGNOSIS — E11621 Type 2 diabetes mellitus with foot ulcer: Secondary | ICD-10-CM | POA: Diagnosis not present

## 2022-04-23 DIAGNOSIS — E559 Vitamin D deficiency, unspecified: Secondary | ICD-10-CM | POA: Diagnosis not present

## 2022-04-23 DIAGNOSIS — I4891 Unspecified atrial fibrillation: Secondary | ICD-10-CM | POA: Diagnosis not present

## 2022-04-23 DIAGNOSIS — M2012 Hallux valgus (acquired), left foot: Secondary | ICD-10-CM | POA: Diagnosis not present

## 2022-04-23 DIAGNOSIS — K7460 Unspecified cirrhosis of liver: Secondary | ICD-10-CM | POA: Diagnosis not present

## 2022-04-23 DIAGNOSIS — M7989 Other specified soft tissue disorders: Secondary | ICD-10-CM | POA: Diagnosis present

## 2022-04-23 DIAGNOSIS — G309 Alzheimer's disease, unspecified: Secondary | ICD-10-CM | POA: Diagnosis not present

## 2022-04-23 DIAGNOSIS — M19072 Primary osteoarthritis, left ankle and foot: Secondary | ICD-10-CM | POA: Diagnosis not present

## 2022-04-23 DIAGNOSIS — L03116 Cellulitis of left lower limb: Secondary | ICD-10-CM | POA: Diagnosis not present

## 2022-04-23 DIAGNOSIS — L97529 Non-pressure chronic ulcer of other part of left foot with unspecified severity: Secondary | ICD-10-CM | POA: Diagnosis not present

## 2022-04-23 DIAGNOSIS — L03032 Cellulitis of left toe: Secondary | ICD-10-CM | POA: Diagnosis not present

## 2022-04-23 DIAGNOSIS — I10 Essential (primary) hypertension: Secondary | ICD-10-CM | POA: Diagnosis not present

## 2022-04-23 DIAGNOSIS — D649 Anemia, unspecified: Secondary | ICD-10-CM | POA: Diagnosis not present

## 2022-04-23 DIAGNOSIS — R4182 Altered mental status, unspecified: Secondary | ICD-10-CM | POA: Diagnosis not present

## 2022-04-23 DIAGNOSIS — J849 Interstitial pulmonary disease, unspecified: Secondary | ICD-10-CM | POA: Diagnosis not present

## 2022-04-23 DIAGNOSIS — M1712 Unilateral primary osteoarthritis, left knee: Secondary | ICD-10-CM | POA: Diagnosis not present

## 2022-04-23 DIAGNOSIS — G547 Phantom limb syndrome without pain: Secondary | ICD-10-CM | POA: Diagnosis not present

## 2022-04-23 LAB — CBC
HCT: 37.5 % — ABNORMAL LOW (ref 39.0–52.0)
Hemoglobin: 12.8 g/dL — ABNORMAL LOW (ref 13.0–17.0)
MCH: 34 pg (ref 26.0–34.0)
MCHC: 34.1 g/dL (ref 30.0–36.0)
MCV: 99.7 fL (ref 80.0–100.0)
Platelets: 184 10*3/uL (ref 150–400)
RBC: 3.76 MIL/uL — ABNORMAL LOW (ref 4.22–5.81)
RDW: 14.6 % (ref 11.5–15.5)
WBC: 7.2 10*3/uL (ref 4.0–10.5)
nRBC: 0 % (ref 0.0–0.2)

## 2022-04-23 LAB — BASIC METABOLIC PANEL
Anion gap: 8 (ref 5–15)
BUN: 13 mg/dL (ref 8–23)
CO2: 25 mmol/L (ref 22–32)
Calcium: 9.2 mg/dL (ref 8.9–10.3)
Chloride: 105 mmol/L (ref 98–111)
Creatinine, Ser: 0.89 mg/dL (ref 0.61–1.24)
GFR, Estimated: >60 mL/min (ref >60)
Glucose, Bld: 90 mg/dL (ref 70–99)
Potassium: 4.3 mmol/L (ref 3.5–5.1)
Sodium: 138 mmol/L (ref 135–145)

## 2022-04-23 LAB — C-REACTIVE PROTEIN: CRP: 2.1 mg/dL — ABNORMAL HIGH (ref <1.0)

## 2022-04-23 LAB — SEDIMENTATION RATE: Sed Rate: 20 mm/hr — ABNORMAL HIGH (ref 0–16)

## 2022-04-23 LAB — LACTIC ACID, PLASMA: Lactic Acid, Venous: 1.7 mmol/L (ref 0.5–1.9)

## 2022-04-23 MED ORDER — CEPHALEXIN 500 MG PO CAPS: 500.0000 mg | ORAL_CAPSULE | Freq: Four times a day (QID) | ORAL | 0 refills | Status: DC

## 2022-04-23 NOTE — ED Notes (Signed)
DC instructions reviewed with pt. PT verbalized understanding.  PT Dc.  

## 2022-04-23 NOTE — ED Triage Notes (Signed)
Pt BIB [facility] from [facility] for wound concern to left foot and leg.  VSS

## 2022-04-23 NOTE — Discharge Instructions (Addendum)
I have printed a prescription for Keflex which I recommend taking to prevent any developing cellulitis, continue daily wound care, and follow-up with podiatry.  I have attached the contact information for podiatrist in the [facility] network if you would like to see one of our podiatrist since to continue the podiatrist at his facility.  Please return if you have concern for worsening leg wounds/ulcerations, concern for worsening infection despite treatment.

## 2022-04-23 NOTE — ED Notes (Signed)
DC instructions reviewed with pt and family.  Understanding was verbalized. PT DC.

## 2022-04-23 NOTE — ED Provider Notes (Signed)
[facility] EMERGENCY DEPARTMENT Provider Note   CSN: [id number] Arrival date & time: 04/23/22  1102     History  No chief complaint on file.   Brett Pearson. is a 81 y.o. 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  Patient with small ulcer versus circular excoriation on the left second toe dorsum, as well as small wound on the anterolateral left shin, both covered in occlusive dressing at this time.  Patient is present with wife who is concerned about the continuity of care with these wounds reporting that she had to make his facility aware of the wounds herself, although reports that he did see a podiatrist who had identified the toe wound at the end of August, patient was concerned because she had not been informed about it until this weekend.  Patient had history of vascular completion on the left extremity with improvement of vascular flow per his surgeon.  He denies any systemic fever, chills  HPI     Home Medications Prior to Admission medications   Medication Sig Start Date End Date Taking? Authorizing Provider  cephALEXin (KEFLEX) 500 MG capsule Take 1 capsule (500 mg total) by mouth 4 (four) times daily. 04/23/22  Yes Christene Pounds H, PA-C  Ascorbic Acid (VITAMIN C) 1000 MG tablet Take 1,000 mg by mouth daily.    [provider]  cholecalciferol (VITAMIN D) 1000 UNITS tablet Take 2,000 Units by mouth daily.    [provider]  Cyanocobalamin (VITAMIN B-12 PO) Take by mouth daily.    [provider]  donepezil (ARICEPT) 10 MG tablet Take 1 tablet (10 mg total) by mouth at bedtime. Patient not taking: No sig reported 04/18/21   [name], [name], MD  Magnesium Gluconate 500  (27 Mg) MG TABS Take 500 mg by mouth.    [provider]  melatonin 3 MG TABS tablet Take 3 tablets (9 mg total) by mouth at bedtime. 06/22/21   Orson Eva, MD  metoprolol succinate (TOPROL-XL) 25 MG 24 hr tablet Take 1 tablet (25 mg total) by mouth daily. 05/31/21   [name], [name], MD  Multiple Vitamin (MULTIVITAMIN) tablet Take 1 tablet by mouth daily.    [provider]  warfarin (COUMADIN) 2 MG tablet 6 mg on every day except Mondays and Fridays, take 4 mg on Monday and Friday 05/31/21   [name], [name], MD      Allergies    Amoxicillin    Review of Systems   Review of Systems  Skin:  Positive for wound.  All other systems reviewed and are negative.   Physical Exam Updated Vital Signs BP 105/60   Pulse (!) 56   Resp 16   SpO2 97%  Physical Exam Vitals and nursing note reviewed.  Constitutional:      General: He is not in acute distress.    Appearance: Normal appearance.  HENT:     Head: Normocephalic and atraumatic.  Eyes:     General:        Right eye: No discharge.        Left eye: No discharge.  Cardiovascular:     Rate and Rhythm: Normal rate and regular rhythm.     Pulses: Normal pulses.     Comments: Strong, 2+ PT,  DP pulses in the left extremity Pulmonary:     Effort: Pulmonary effort is normal. No respiratory distress.  Musculoskeletal:        General: No deformity.  Skin:    General: Skin is warm and dry.     Capillary Refill: Capillary refill takes less than 2 seconds.     Comments: Patient with small area of ulceration noted on the lateral aspect of the left tibia with appropriate granulation tissue, no significant surrounding redness.  Additional small area of ulceration approximately 2 mm in diameter on the dorsum of the left second toe with some surrounding redness but without purulent drainage or fluctuance.  No induration.  Neurological:     Mental Status: He is alert and oriented to person, place, and time.  Psychiatric:         Mood and Affect: Mood normal.        Behavior: Behavior normal.     ED Results / Procedures / Treatments   Labs (all labs ordered are listed, but only abnormal results are displayed) Labs Reviewed  CBC - Abnormal; Notable for the following components:      Result Value   RBC 3.76 (*)    Hemoglobin 12.8 (*)    HCT 37.5 (*)    All other components within normal limits  C-REACTIVE PROTEIN - Abnormal; Notable for the following components:   CRP 2.1 (*)    All other components within normal limits  BASIC METABOLIC PANEL  LACTIC ACID, PLASMA  SEDIMENTATION RATE    EKG None  Radiology DG Tibia/Fibula Left  Result Date: 04/23/2022 CLINICAL DATA:  r/o osteo EXAM: LEFT TIBIA AND FIBULA - 2 VIEW COMPARISON:  None Available. FINDINGS: Mild degenerative changes in the left knee with joint space narrowing and early spurring. No joint effusion. No acute bony abnormality. Specifically, no fracture, subluxation, or dislocation. No bone destruction to suggest osteomyelitis. No soft tissue gas or radiopaque foreign body. Vascular calcifications noted. IMPRESSION: No acute bony abnormality. Electronically Signed   By: Rolm Baptise M.D.   On: 04/23/2022 13:27   DG Foot Complete Left  Result Date: 04/23/2022 CLINICAL DATA:  Left foot/toe, rule out osteomyelitis EXAM: LEFT FOOT - COMPLETE 3+ VIEW COMPARISON:  None Available. FINDINGS: Degenerative changes and hallux valgus deformity at the 1st MTP joint. No acute bony abnormality. Specifically, no fracture, subluxation, or dislocation. No bone destruction to suggest osteomyelitis. Soft tissues are intact. No soft tissue gas or radiopaque foreign body. IMPRESSION: No acute bony abnormality. Electronically Signed   By: Rolm Baptise M.D.   On: 04/23/2022 13:26    Procedures Procedures    Medications Ordered in ED Medications - No data to display  ED Course/ Medical Decision Making/ A&P                           Medical Decision  Making Amount and/or Complexity of Data Reviewed Labs: ordered.   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  Wife is concerned considering previous history of right BKA about developing osteomyelitis, cellulitis, or poor care of ulcers in context of known peripheral arterial disease and peripheral vascular disease.  Patient does not have known history of diabetes.  On my exam patient with appropriately healing ulcers  with some signs of possible very early cellulitis on the left toe, no signs of early cellulitis on the left tibia.  There is appropriate granulation tissue and both wounds, no evidence of fluctuance, abscess formation, no evidence of exposed bone, tendon, and intact neurovascularly with strong pulses in the affected extremity.  Emergent differential diagnosis includes appropriately healing ulcer, cellulitis, osteomyelitis, infected foot requiring amputation versus other.  I independently interpreted lab work including CBC, BMP, lactic acid, CRP.  CBC overall unremarkable other than mild anemia with hemoglobin 12.8, no clinically significant leukocytosis.  CMP is unremarkable.  Lactic acid is unremarkable at 1.7, CRP is very mildly elevated 2.1.  I independently interpreted imaging including plain film radiograph of the left tibia/fibula, and left foot which shows no evidence of acute fracture, dislocation, or developing osteomyelitis. I agree with the radiologist interpretation.  Based on exam findings and reassuring lab work I have low clinical suspicion for insidious osteomyelitis development, wounds appear to be healing overall appropriately other than possible early cellulitis of left toe.  We will cover  patient for early developing infection with Keflex, and encouraged follow-up with outpatient podiatry.  Continued wound care recommended.  Patient discharged in stable condition at this time, return precautions given. Final Clinical Impression(s) / ED Diagnoses Final diagnoses:  Chronic ulcer of left leg, limited to breakdown of skin (HCC)  Cellulitis of second toe of left foot    Rx / DC Orders ED Discharge Orders          Ordered    cephALEXin (KEFLEX) 500 MG capsule  4 times daily        04/23/22 1353              Mattisyn Cardona, [place] H, PA-C 04/23/22 1416    Ottie Glazier, DO 04/23/22 1542

## 2022-04-25 ENCOUNTER — Telehealth: Payer: Self-pay | Admitting: Family Medicine

## 2022-04-25 DIAGNOSIS — L0390 Cellulitis, unspecified: Secondary | ICD-10-CM | POA: Diagnosis not present

## 2022-04-25 DIAGNOSIS — J849 Interstitial pulmonary disease, unspecified: Secondary | ICD-10-CM | POA: Diagnosis not present

## 2022-04-25 DIAGNOSIS — I4891 Unspecified atrial fibrillation: Secondary | ICD-10-CM | POA: Diagnosis not present

## 2022-04-25 DIAGNOSIS — I10 Essential (primary) hypertension: Secondary | ICD-10-CM | POA: Diagnosis not present

## 2022-04-25 DIAGNOSIS — G629 Polyneuropathy, unspecified: Secondary | ICD-10-CM | POA: Diagnosis not present

## 2022-04-25 DIAGNOSIS — E538 Deficiency of other specified B group vitamins: Secondary | ICD-10-CM | POA: Diagnosis not present

## 2022-04-25 DIAGNOSIS — G309 Alzheimer's disease, unspecified: Secondary | ICD-10-CM | POA: Diagnosis not present

## 2022-04-25 DIAGNOSIS — K7460 Unspecified cirrhosis of liver: Secondary | ICD-10-CM | POA: Diagnosis not present

## 2022-04-25 DIAGNOSIS — E039 Hypothyroidism, unspecified: Secondary | ICD-10-CM | POA: Diagnosis not present

## 2022-04-25 NOTE — Telephone Encounter (Signed)
I did see the notes from the hospital that he was in there for a wound, hopefully he will get better soon

## 2022-04-25 NOTE — Telephone Encounter (Signed)
Wife aware

## 2022-05-01 DIAGNOSIS — S81802A Unspecified open wound, left lower leg, initial encounter: Secondary | ICD-10-CM | POA: Diagnosis not present

## 2022-05-05 DIAGNOSIS — I4891 Unspecified atrial fibrillation: Secondary | ICD-10-CM | POA: Diagnosis not present

## 2022-05-05 DIAGNOSIS — I10 Essential (primary) hypertension: Secondary | ICD-10-CM | POA: Diagnosis not present

## 2022-05-05 DIAGNOSIS — E559 Vitamin D deficiency, unspecified: Secondary | ICD-10-CM | POA: Diagnosis not present

## 2022-05-05 DIAGNOSIS — E039 Hypothyroidism, unspecified: Secondary | ICD-10-CM | POA: Diagnosis not present

## 2022-05-05 DIAGNOSIS — G309 Alzheimer's disease, unspecified: Secondary | ICD-10-CM | POA: Diagnosis not present

## 2022-05-07 ENCOUNTER — Telehealth: Payer: Self-pay

## 2022-05-07 DIAGNOSIS — M6281 Muscle weakness (generalized): Secondary | ICD-10-CM | POA: Diagnosis not present

## 2022-05-07 NOTE — Telephone Encounter (Signed)
     Patient  visit on 04/23/2022  at [facility]  was for Non-pressure chronic ulcer of unspecified part of left lower leg limited to breakdown of skin.  Have you been able to follow up with your primary care physician? Patient is currently in a nursing home.  The patient was or was not able to obtain any needed medicine or equipment.  Are there diet recommendations that you are having difficulty following?  Spoke with patient's spouse/Brett Pearson, patient is currently in a nursing home.   [place] Resource Care Guide   ??[email or web address]  ?? [phone]   Website: [email or web address]  [email or web address]

## 2022-05-07 NOTE — Telephone Encounter (Signed)
        Patient  visited [facility] on 04/23/2022  for Non-pressure chronic ulcer of unspecified part of left lower leg limited to breakdown of skin.   Telephone encounter attempt :  1st  Spoke with spouse/Carole patient is in a nursing home, not a good time to talk. She requested that I call her later.   [place] Resource Care Guide   [email or web address]  ?? [phone]   Website: [email or web address]  [email or web address]

## 2022-05-08 DIAGNOSIS — S81802A Unspecified open wound, left lower leg, initial encounter: Secondary | ICD-10-CM | POA: Diagnosis not present

## 2022-05-15 DIAGNOSIS — E559 Vitamin D deficiency, unspecified: Secondary | ICD-10-CM | POA: Diagnosis not present

## 2022-05-15 DIAGNOSIS — I4891 Unspecified atrial fibrillation: Secondary | ICD-10-CM | POA: Diagnosis not present

## 2022-05-15 DIAGNOSIS — I10 Essential (primary) hypertension: Secondary | ICD-10-CM | POA: Diagnosis not present

## 2022-05-15 DIAGNOSIS — S81802A Unspecified open wound, left lower leg, initial encounter: Secondary | ICD-10-CM | POA: Diagnosis not present

## 2022-05-15 DIAGNOSIS — G309 Alzheimer's disease, unspecified: Secondary | ICD-10-CM | POA: Diagnosis not present

## 2022-05-15 DIAGNOSIS — E039 Hypothyroidism, unspecified: Secondary | ICD-10-CM | POA: Diagnosis not present

## 2022-05-17 DIAGNOSIS — L0390 Cellulitis, unspecified: Secondary | ICD-10-CM | POA: Diagnosis not present

## 2022-05-17 DIAGNOSIS — I4891 Unspecified atrial fibrillation: Secondary | ICD-10-CM | POA: Diagnosis not present

## 2022-05-17 DIAGNOSIS — G309 Alzheimer's disease, unspecified: Secondary | ICD-10-CM | POA: Diagnosis not present

## 2022-05-17 DIAGNOSIS — D649 Anemia, unspecified: Secondary | ICD-10-CM | POA: Diagnosis not present

## 2022-05-17 DIAGNOSIS — E559 Vitamin D deficiency, unspecified: Secondary | ICD-10-CM | POA: Diagnosis not present

## 2022-05-17 DIAGNOSIS — E538 Deficiency of other specified B group vitamins: Secondary | ICD-10-CM | POA: Diagnosis not present

## 2022-05-17 DIAGNOSIS — E039 Hypothyroidism, unspecified: Secondary | ICD-10-CM | POA: Diagnosis not present

## 2022-05-17 DIAGNOSIS — I10 Essential (primary) hypertension: Secondary | ICD-10-CM | POA: Diagnosis not present

## 2022-05-17 DIAGNOSIS — J849 Interstitial pulmonary disease, unspecified: Secondary | ICD-10-CM | POA: Diagnosis not present

## 2022-05-23 DIAGNOSIS — D649 Anemia, unspecified: Secondary | ICD-10-CM | POA: Diagnosis not present

## 2022-05-23 DIAGNOSIS — G309 Alzheimer's disease, unspecified: Secondary | ICD-10-CM | POA: Diagnosis not present

## 2022-05-23 DIAGNOSIS — I10 Essential (primary) hypertension: Secondary | ICD-10-CM | POA: Diagnosis not present

## 2022-05-23 DIAGNOSIS — I4891 Unspecified atrial fibrillation: Secondary | ICD-10-CM | POA: Diagnosis not present

## 2022-05-23 DIAGNOSIS — G629 Polyneuropathy, unspecified: Secondary | ICD-10-CM | POA: Diagnosis not present

## 2022-05-23 DIAGNOSIS — E559 Vitamin D deficiency, unspecified: Secondary | ICD-10-CM | POA: Diagnosis not present

## 2022-05-23 DIAGNOSIS — K7460 Unspecified cirrhosis of liver: Secondary | ICD-10-CM | POA: Diagnosis not present

## 2022-05-23 DIAGNOSIS — G547 Phantom limb syndrome without pain: Secondary | ICD-10-CM | POA: Diagnosis not present

## 2022-05-23 DIAGNOSIS — L853 Xerosis cutis: Secondary | ICD-10-CM | POA: Diagnosis not present

## 2022-05-23 DIAGNOSIS — E039 Hypothyroidism, unspecified: Secondary | ICD-10-CM | POA: Diagnosis not present

## 2022-06-06 DIAGNOSIS — M6281 Muscle weakness (generalized): Secondary | ICD-10-CM | POA: Diagnosis not present

## 2022-06-07 DIAGNOSIS — B351 Tinea unguium: Secondary | ICD-10-CM | POA: Diagnosis not present

## 2022-06-07 DIAGNOSIS — I7091 Generalized atherosclerosis: Secondary | ICD-10-CM | POA: Diagnosis not present

## 2022-06-12 DIAGNOSIS — E119 Type 2 diabetes mellitus without complications: Secondary | ICD-10-CM | POA: Diagnosis not present

## 2022-06-12 DIAGNOSIS — E0590 Thyrotoxicosis, unspecified without thyrotoxic crisis or storm: Secondary | ICD-10-CM | POA: Diagnosis not present

## 2022-06-12 DIAGNOSIS — I10 Essential (primary) hypertension: Secondary | ICD-10-CM | POA: Diagnosis not present

## 2022-06-12 DIAGNOSIS — E559 Vitamin D deficiency, unspecified: Secondary | ICD-10-CM | POA: Diagnosis not present

## 2022-06-12 DIAGNOSIS — D508 Other iron deficiency anemias: Secondary | ICD-10-CM | POA: Diagnosis not present

## 2022-06-12 DIAGNOSIS — E785 Hyperlipidemia, unspecified: Secondary | ICD-10-CM | POA: Diagnosis not present

## 2022-06-12 DIAGNOSIS — R41841 Cognitive communication deficit: Secondary | ICD-10-CM | POA: Diagnosis not present

## 2022-06-14 DIAGNOSIS — E538 Deficiency of other specified B group vitamins: Secondary | ICD-10-CM | POA: Diagnosis not present

## 2022-06-14 DIAGNOSIS — E559 Vitamin D deficiency, unspecified: Secondary | ICD-10-CM | POA: Diagnosis not present

## 2022-06-14 DIAGNOSIS — E0590 Thyrotoxicosis, unspecified without thyrotoxic crisis or storm: Secondary | ICD-10-CM | POA: Diagnosis not present

## 2022-06-14 DIAGNOSIS — I4891 Unspecified atrial fibrillation: Secondary | ICD-10-CM | POA: Diagnosis not present

## 2022-06-14 DIAGNOSIS — J849 Interstitial pulmonary disease, unspecified: Secondary | ICD-10-CM | POA: Diagnosis not present

## 2022-06-14 DIAGNOSIS — E039 Hypothyroidism, unspecified: Secondary | ICD-10-CM | POA: Diagnosis not present

## 2022-06-14 DIAGNOSIS — L853 Xerosis cutis: Secondary | ICD-10-CM | POA: Diagnosis not present

## 2022-06-14 DIAGNOSIS — I10 Essential (primary) hypertension: Secondary | ICD-10-CM | POA: Diagnosis not present

## 2022-06-14 DIAGNOSIS — D649 Anemia, unspecified: Secondary | ICD-10-CM | POA: Diagnosis not present

## 2022-06-18 DIAGNOSIS — E559 Vitamin D deficiency, unspecified: Secondary | ICD-10-CM | POA: Diagnosis not present

## 2022-06-18 DIAGNOSIS — G547 Phantom limb syndrome without pain: Secondary | ICD-10-CM | POA: Diagnosis not present

## 2022-06-18 DIAGNOSIS — D649 Anemia, unspecified: Secondary | ICD-10-CM | POA: Diagnosis not present

## 2022-06-18 DIAGNOSIS — E039 Hypothyroidism, unspecified: Secondary | ICD-10-CM | POA: Diagnosis not present

## 2022-06-18 DIAGNOSIS — G629 Polyneuropathy, unspecified: Secondary | ICD-10-CM | POA: Diagnosis not present

## 2022-06-18 DIAGNOSIS — I10 Essential (primary) hypertension: Secondary | ICD-10-CM | POA: Diagnosis not present

## 2022-06-18 DIAGNOSIS — I4891 Unspecified atrial fibrillation: Secondary | ICD-10-CM | POA: Diagnosis not present

## 2022-06-18 DIAGNOSIS — E538 Deficiency of other specified B group vitamins: Secondary | ICD-10-CM | POA: Diagnosis not present

## 2022-06-18 DIAGNOSIS — J849 Interstitial pulmonary disease, unspecified: Secondary | ICD-10-CM | POA: Diagnosis not present

## 2022-06-30 DIAGNOSIS — I4891 Unspecified atrial fibrillation: Secondary | ICD-10-CM | POA: Diagnosis not present

## 2022-06-30 DIAGNOSIS — E559 Vitamin D deficiency, unspecified: Secondary | ICD-10-CM | POA: Diagnosis not present

## 2022-06-30 DIAGNOSIS — E039 Hypothyroidism, unspecified: Secondary | ICD-10-CM | POA: Diagnosis not present

## 2022-06-30 DIAGNOSIS — G309 Alzheimer's disease, unspecified: Secondary | ICD-10-CM | POA: Diagnosis not present

## 2022-06-30 DIAGNOSIS — I10 Essential (primary) hypertension: Secondary | ICD-10-CM | POA: Diagnosis not present

## 2022-07-04 DIAGNOSIS — E559 Vitamin D deficiency, unspecified: Secondary | ICD-10-CM | POA: Diagnosis not present

## 2022-07-04 DIAGNOSIS — E538 Deficiency of other specified B group vitamins: Secondary | ICD-10-CM | POA: Diagnosis not present

## 2022-07-04 DIAGNOSIS — I10 Essential (primary) hypertension: Secondary | ICD-10-CM | POA: Diagnosis not present

## 2022-07-04 DIAGNOSIS — D649 Anemia, unspecified: Secondary | ICD-10-CM | POA: Diagnosis not present

## 2022-07-04 DIAGNOSIS — L853 Xerosis cutis: Secondary | ICD-10-CM | POA: Diagnosis not present

## 2022-07-04 DIAGNOSIS — I4891 Unspecified atrial fibrillation: Secondary | ICD-10-CM | POA: Diagnosis not present

## 2022-07-04 DIAGNOSIS — J849 Interstitial pulmonary disease, unspecified: Secondary | ICD-10-CM | POA: Diagnosis not present

## 2022-07-06 DIAGNOSIS — R41841 Cognitive communication deficit: Secondary | ICD-10-CM | POA: Diagnosis not present

## 2022-07-18 DIAGNOSIS — E559 Vitamin D deficiency, unspecified: Secondary | ICD-10-CM | POA: Diagnosis not present

## 2022-07-18 DIAGNOSIS — D649 Anemia, unspecified: Secondary | ICD-10-CM | POA: Diagnosis not present

## 2022-07-18 DIAGNOSIS — I4891 Unspecified atrial fibrillation: Secondary | ICD-10-CM | POA: Diagnosis not present

## 2022-07-18 DIAGNOSIS — E538 Deficiency of other specified B group vitamins: Secondary | ICD-10-CM | POA: Diagnosis not present

## 2022-07-18 DIAGNOSIS — G547 Phantom limb syndrome without pain: Secondary | ICD-10-CM | POA: Diagnosis not present

## 2022-07-18 DIAGNOSIS — G629 Polyneuropathy, unspecified: Secondary | ICD-10-CM | POA: Diagnosis not present

## 2022-07-18 DIAGNOSIS — I10 Essential (primary) hypertension: Secondary | ICD-10-CM | POA: Diagnosis not present

## 2022-07-18 DIAGNOSIS — E039 Hypothyroidism, unspecified: Secondary | ICD-10-CM | POA: Diagnosis not present

## 2022-07-18 DIAGNOSIS — J849 Interstitial pulmonary disease, unspecified: Secondary | ICD-10-CM | POA: Diagnosis not present

## 2022-07-24 DIAGNOSIS — I4891 Unspecified atrial fibrillation: Secondary | ICD-10-CM | POA: Diagnosis not present

## 2022-07-24 DIAGNOSIS — E559 Vitamin D deficiency, unspecified: Secondary | ICD-10-CM | POA: Diagnosis not present

## 2022-07-24 DIAGNOSIS — J849 Interstitial pulmonary disease, unspecified: Secondary | ICD-10-CM | POA: Diagnosis not present

## 2022-07-24 DIAGNOSIS — E039 Hypothyroidism, unspecified: Secondary | ICD-10-CM | POA: Diagnosis not present

## 2022-07-24 DIAGNOSIS — I10 Essential (primary) hypertension: Secondary | ICD-10-CM | POA: Diagnosis not present

## 2022-07-24 DIAGNOSIS — E538 Deficiency of other specified B group vitamins: Secondary | ICD-10-CM | POA: Diagnosis not present

## 2022-07-24 DIAGNOSIS — D649 Anemia, unspecified: Secondary | ICD-10-CM | POA: Diagnosis not present

## 2022-07-24 DIAGNOSIS — G629 Polyneuropathy, unspecified: Secondary | ICD-10-CM | POA: Diagnosis not present

## 2022-08-05 DIAGNOSIS — I4891 Unspecified atrial fibrillation: Secondary | ICD-10-CM | POA: Diagnosis not present

## 2022-08-05 DIAGNOSIS — E559 Vitamin D deficiency, unspecified: Secondary | ICD-10-CM | POA: Diagnosis not present

## 2022-08-05 DIAGNOSIS — E039 Hypothyroidism, unspecified: Secondary | ICD-10-CM | POA: Diagnosis not present

## 2022-08-05 DIAGNOSIS — I10 Essential (primary) hypertension: Secondary | ICD-10-CM | POA: Diagnosis not present

## 2022-08-05 DIAGNOSIS — G309 Alzheimer's disease, unspecified: Secondary | ICD-10-CM | POA: Diagnosis not present

## 2022-08-20 DIAGNOSIS — J849 Interstitial pulmonary disease, unspecified: Secondary | ICD-10-CM | POA: Diagnosis not present

## 2022-08-20 DIAGNOSIS — I4891 Unspecified atrial fibrillation: Secondary | ICD-10-CM | POA: Diagnosis not present

## 2022-08-20 DIAGNOSIS — D649 Anemia, unspecified: Secondary | ICD-10-CM | POA: Diagnosis not present

## 2022-08-20 DIAGNOSIS — G629 Polyneuropathy, unspecified: Secondary | ICD-10-CM | POA: Diagnosis not present

## 2022-08-20 DIAGNOSIS — E559 Vitamin D deficiency, unspecified: Secondary | ICD-10-CM | POA: Diagnosis not present

## 2022-08-20 DIAGNOSIS — E039 Hypothyroidism, unspecified: Secondary | ICD-10-CM | POA: Diagnosis not present

## 2022-08-20 DIAGNOSIS — I10 Essential (primary) hypertension: Secondary | ICD-10-CM | POA: Diagnosis not present

## 2022-08-23 DIAGNOSIS — D649 Anemia, unspecified: Secondary | ICD-10-CM | POA: Diagnosis not present

## 2022-08-23 DIAGNOSIS — I4891 Unspecified atrial fibrillation: Secondary | ICD-10-CM | POA: Diagnosis not present

## 2022-08-23 DIAGNOSIS — I10 Essential (primary) hypertension: Secondary | ICD-10-CM | POA: Diagnosis not present

## 2022-08-23 DIAGNOSIS — E039 Hypothyroidism, unspecified: Secondary | ICD-10-CM | POA: Diagnosis not present

## 2022-08-23 DIAGNOSIS — G547 Phantom limb syndrome without pain: Secondary | ICD-10-CM | POA: Diagnosis not present

## 2022-08-23 DIAGNOSIS — E538 Deficiency of other specified B group vitamins: Secondary | ICD-10-CM | POA: Diagnosis not present

## 2022-08-30 DIAGNOSIS — I4891 Unspecified atrial fibrillation: Secondary | ICD-10-CM | POA: Diagnosis not present

## 2022-08-30 DIAGNOSIS — G629 Polyneuropathy, unspecified: Secondary | ICD-10-CM | POA: Diagnosis not present

## 2022-08-30 DIAGNOSIS — D649 Anemia, unspecified: Secondary | ICD-10-CM | POA: Diagnosis not present

## 2022-08-30 DIAGNOSIS — E538 Deficiency of other specified B group vitamins: Secondary | ICD-10-CM | POA: Diagnosis not present

## 2022-08-30 DIAGNOSIS — G309 Alzheimer's disease, unspecified: Secondary | ICD-10-CM | POA: Diagnosis not present

## 2022-08-30 DIAGNOSIS — E559 Vitamin D deficiency, unspecified: Secondary | ICD-10-CM | POA: Diagnosis not present

## 2022-08-30 DIAGNOSIS — I10 Essential (primary) hypertension: Secondary | ICD-10-CM | POA: Diagnosis not present

## 2022-08-30 DIAGNOSIS — E039 Hypothyroidism, unspecified: Secondary | ICD-10-CM | POA: Diagnosis not present

## 2022-09-04 DIAGNOSIS — G309 Alzheimer's disease, unspecified: Secondary | ICD-10-CM | POA: Diagnosis not present

## 2022-09-04 DIAGNOSIS — I10 Essential (primary) hypertension: Secondary | ICD-10-CM | POA: Diagnosis not present

## 2022-09-04 DIAGNOSIS — I4891 Unspecified atrial fibrillation: Secondary | ICD-10-CM | POA: Diagnosis not present

## 2022-09-04 DIAGNOSIS — E039 Hypothyroidism, unspecified: Secondary | ICD-10-CM | POA: Diagnosis not present

## 2022-09-04 DIAGNOSIS — E559 Vitamin D deficiency, unspecified: Secondary | ICD-10-CM | POA: Diagnosis not present

## 2022-09-05 DIAGNOSIS — I7091 Generalized atherosclerosis: Secondary | ICD-10-CM | POA: Diagnosis not present

## 2022-09-05 DIAGNOSIS — B351 Tinea unguium: Secondary | ICD-10-CM | POA: Diagnosis not present

## 2022-09-12 DIAGNOSIS — E119 Type 2 diabetes mellitus without complications: Secondary | ICD-10-CM | POA: Diagnosis not present

## 2022-09-12 DIAGNOSIS — I10 Essential (primary) hypertension: Secondary | ICD-10-CM | POA: Diagnosis not present

## 2022-09-19 DIAGNOSIS — I4891 Unspecified atrial fibrillation: Secondary | ICD-10-CM | POA: Diagnosis not present

## 2022-09-19 DIAGNOSIS — E538 Deficiency of other specified B group vitamins: Secondary | ICD-10-CM | POA: Diagnosis not present

## 2022-09-19 DIAGNOSIS — J849 Interstitial pulmonary disease, unspecified: Secondary | ICD-10-CM | POA: Diagnosis not present

## 2022-09-19 DIAGNOSIS — D649 Anemia, unspecified: Secondary | ICD-10-CM | POA: Diagnosis not present

## 2022-09-19 DIAGNOSIS — E559 Vitamin D deficiency, unspecified: Secondary | ICD-10-CM | POA: Diagnosis not present

## 2022-09-19 DIAGNOSIS — E039 Hypothyroidism, unspecified: Secondary | ICD-10-CM | POA: Diagnosis not present

## 2022-09-19 DIAGNOSIS — G629 Polyneuropathy, unspecified: Secondary | ICD-10-CM | POA: Diagnosis not present

## 2022-09-19 DIAGNOSIS — I10 Essential (primary) hypertension: Secondary | ICD-10-CM | POA: Diagnosis not present

## 2022-09-26 DIAGNOSIS — G547 Phantom limb syndrome without pain: Secondary | ICD-10-CM | POA: Diagnosis not present

## 2022-09-26 DIAGNOSIS — E038 Other specified hypothyroidism: Secondary | ICD-10-CM | POA: Diagnosis not present

## 2022-09-26 DIAGNOSIS — B351 Tinea unguium: Secondary | ICD-10-CM | POA: Diagnosis not present

## 2022-09-26 DIAGNOSIS — E039 Hypothyroidism, unspecified: Secondary | ICD-10-CM | POA: Diagnosis not present

## 2022-09-26 DIAGNOSIS — G309 Alzheimer's disease, unspecified: Secondary | ICD-10-CM | POA: Diagnosis not present

## 2022-09-27 DIAGNOSIS — R040 Epistaxis: Secondary | ICD-10-CM | POA: Diagnosis not present

## 2022-09-27 DIAGNOSIS — D649 Anemia, unspecified: Secondary | ICD-10-CM | POA: Diagnosis not present

## 2022-09-27 DIAGNOSIS — I4891 Unspecified atrial fibrillation: Secondary | ICD-10-CM | POA: Diagnosis not present

## 2022-09-27 DIAGNOSIS — E538 Deficiency of other specified B group vitamins: Secondary | ICD-10-CM | POA: Diagnosis not present

## 2022-09-27 DIAGNOSIS — G309 Alzheimer's disease, unspecified: Secondary | ICD-10-CM | POA: Diagnosis not present

## 2022-09-27 DIAGNOSIS — E038 Other specified hypothyroidism: Secondary | ICD-10-CM | POA: Diagnosis not present

## 2022-09-27 DIAGNOSIS — J849 Interstitial pulmonary disease, unspecified: Secondary | ICD-10-CM | POA: Diagnosis not present

## 2022-09-27 DIAGNOSIS — E039 Hypothyroidism, unspecified: Secondary | ICD-10-CM | POA: Diagnosis not present

## 2022-09-27 DIAGNOSIS — I10 Essential (primary) hypertension: Secondary | ICD-10-CM | POA: Diagnosis not present

## 2022-09-27 DIAGNOSIS — B351 Tinea unguium: Secondary | ICD-10-CM | POA: Diagnosis not present

## 2022-09-27 DIAGNOSIS — G629 Polyneuropathy, unspecified: Secondary | ICD-10-CM | POA: Diagnosis not present

## 2022-09-27 DIAGNOSIS — G547 Phantom limb syndrome without pain: Secondary | ICD-10-CM | POA: Diagnosis not present

## 2022-10-04 DIAGNOSIS — I10 Essential (primary) hypertension: Secondary | ICD-10-CM | POA: Diagnosis not present

## 2022-10-04 DIAGNOSIS — E559 Vitamin D deficiency, unspecified: Secondary | ICD-10-CM | POA: Diagnosis not present

## 2022-10-04 DIAGNOSIS — E039 Hypothyroidism, unspecified: Secondary | ICD-10-CM | POA: Diagnosis not present

## 2022-10-04 DIAGNOSIS — I4891 Unspecified atrial fibrillation: Secondary | ICD-10-CM | POA: Diagnosis not present

## 2022-10-04 DIAGNOSIS — G309 Alzheimer's disease, unspecified: Secondary | ICD-10-CM | POA: Diagnosis not present

## 2022-10-24 DIAGNOSIS — D649 Anemia, unspecified: Secondary | ICD-10-CM | POA: Diagnosis not present

## 2022-10-24 DIAGNOSIS — J849 Interstitial pulmonary disease, unspecified: Secondary | ICD-10-CM | POA: Diagnosis not present

## 2022-10-24 DIAGNOSIS — E538 Deficiency of other specified B group vitamins: Secondary | ICD-10-CM | POA: Diagnosis not present

## 2022-10-24 DIAGNOSIS — G309 Alzheimer's disease, unspecified: Secondary | ICD-10-CM | POA: Diagnosis not present

## 2022-10-24 DIAGNOSIS — E559 Vitamin D deficiency, unspecified: Secondary | ICD-10-CM | POA: Diagnosis not present

## 2022-10-24 DIAGNOSIS — I10 Essential (primary) hypertension: Secondary | ICD-10-CM | POA: Diagnosis not present

## 2022-10-24 DIAGNOSIS — E038 Other specified hypothyroidism: Secondary | ICD-10-CM | POA: Diagnosis not present

## 2022-10-24 DIAGNOSIS — B351 Tinea unguium: Secondary | ICD-10-CM | POA: Diagnosis not present

## 2022-10-24 DIAGNOSIS — I4891 Unspecified atrial fibrillation: Secondary | ICD-10-CM | POA: Diagnosis not present

## 2022-10-24 DIAGNOSIS — S81812A Laceration without foreign body, left lower leg, initial encounter: Secondary | ICD-10-CM | POA: Diagnosis not present

## 2022-10-30 DIAGNOSIS — I4891 Unspecified atrial fibrillation: Secondary | ICD-10-CM | POA: Diagnosis not present

## 2022-10-30 DIAGNOSIS — H9190 Unspecified hearing loss, unspecified ear: Secondary | ICD-10-CM | POA: Diagnosis not present

## 2022-10-30 DIAGNOSIS — I10 Essential (primary) hypertension: Secondary | ICD-10-CM | POA: Diagnosis not present

## 2022-10-30 DIAGNOSIS — E039 Hypothyroidism, unspecified: Secondary | ICD-10-CM | POA: Diagnosis not present

## 2022-10-30 DIAGNOSIS — F0280 Dementia in other diseases classified elsewhere without behavioral disturbance: Secondary | ICD-10-CM | POA: Diagnosis not present

## 2022-10-30 DIAGNOSIS — G309 Alzheimer's disease, unspecified: Secondary | ICD-10-CM | POA: Diagnosis not present

## 2022-10-30 DIAGNOSIS — E559 Vitamin D deficiency, unspecified: Secondary | ICD-10-CM | POA: Diagnosis not present

## 2022-11-07 DIAGNOSIS — R630 Anorexia: Secondary | ICD-10-CM | POA: Diagnosis not present

## 2022-11-07 DIAGNOSIS — E559 Vitamin D deficiency, unspecified: Secondary | ICD-10-CM | POA: Diagnosis not present

## 2022-11-07 DIAGNOSIS — I4891 Unspecified atrial fibrillation: Secondary | ICD-10-CM | POA: Diagnosis not present

## 2022-11-07 DIAGNOSIS — G629 Polyneuropathy, unspecified: Secondary | ICD-10-CM | POA: Diagnosis not present

## 2022-11-07 DIAGNOSIS — G547 Phantom limb syndrome without pain: Secondary | ICD-10-CM | POA: Diagnosis not present

## 2022-11-07 DIAGNOSIS — G309 Alzheimer's disease, unspecified: Secondary | ICD-10-CM | POA: Diagnosis not present

## 2022-11-07 DIAGNOSIS — B351 Tinea unguium: Secondary | ICD-10-CM | POA: Diagnosis not present

## 2022-11-07 DIAGNOSIS — E039 Hypothyroidism, unspecified: Secondary | ICD-10-CM | POA: Diagnosis not present

## 2022-11-07 DIAGNOSIS — E038 Other specified hypothyroidism: Secondary | ICD-10-CM | POA: Diagnosis not present

## 2022-11-07 DIAGNOSIS — D649 Anemia, unspecified: Secondary | ICD-10-CM | POA: Diagnosis not present

## 2022-11-07 DIAGNOSIS — I10 Essential (primary) hypertension: Secondary | ICD-10-CM | POA: Diagnosis not present

## 2022-11-07 DIAGNOSIS — L89152 Pressure ulcer of sacral region, stage 2: Secondary | ICD-10-CM | POA: Diagnosis not present

## 2022-11-08 DIAGNOSIS — R296 Repeated falls: Secondary | ICD-10-CM | POA: Diagnosis not present

## 2022-11-12 DIAGNOSIS — G309 Alzheimer's disease, unspecified: Secondary | ICD-10-CM | POA: Diagnosis not present

## 2022-11-12 DIAGNOSIS — B351 Tinea unguium: Secondary | ICD-10-CM | POA: Diagnosis not present

## 2022-11-12 DIAGNOSIS — J849 Interstitial pulmonary disease, unspecified: Secondary | ICD-10-CM | POA: Diagnosis not present

## 2022-11-12 DIAGNOSIS — R630 Anorexia: Secondary | ICD-10-CM | POA: Diagnosis not present

## 2022-11-12 DIAGNOSIS — E038 Other specified hypothyroidism: Secondary | ICD-10-CM | POA: Diagnosis not present

## 2022-11-12 DIAGNOSIS — G629 Polyneuropathy, unspecified: Secondary | ICD-10-CM | POA: Diagnosis not present

## 2022-11-12 DIAGNOSIS — G547 Phantom limb syndrome without pain: Secondary | ICD-10-CM | POA: Diagnosis not present

## 2022-11-12 DIAGNOSIS — L89152 Pressure ulcer of sacral region, stage 2: Secondary | ICD-10-CM | POA: Diagnosis not present

## 2022-11-12 DIAGNOSIS — D649 Anemia, unspecified: Secondary | ICD-10-CM | POA: Diagnosis not present

## 2022-11-12 DIAGNOSIS — E559 Vitamin D deficiency, unspecified: Secondary | ICD-10-CM | POA: Diagnosis not present

## 2022-11-12 DIAGNOSIS — I4891 Unspecified atrial fibrillation: Secondary | ICD-10-CM | POA: Diagnosis not present

## 2022-11-13 DIAGNOSIS — L89152 Pressure ulcer of sacral region, stage 2: Secondary | ICD-10-CM | POA: Diagnosis not present

## 2022-11-13 DIAGNOSIS — S91302A Unspecified open wound, left foot, initial encounter: Secondary | ICD-10-CM | POA: Diagnosis not present

## 2022-11-20 DIAGNOSIS — L89152 Pressure ulcer of sacral region, stage 2: Secondary | ICD-10-CM | POA: Diagnosis not present

## 2022-11-20 DIAGNOSIS — S91302A Unspecified open wound, left foot, initial encounter: Secondary | ICD-10-CM | POA: Diagnosis not present

## 2022-11-21 DIAGNOSIS — E038 Other specified hypothyroidism: Secondary | ICD-10-CM | POA: Diagnosis not present

## 2022-11-21 DIAGNOSIS — E559 Vitamin D deficiency, unspecified: Secondary | ICD-10-CM | POA: Diagnosis not present

## 2022-11-21 DIAGNOSIS — J849 Interstitial pulmonary disease, unspecified: Secondary | ICD-10-CM | POA: Diagnosis not present

## 2022-11-21 DIAGNOSIS — I10 Essential (primary) hypertension: Secondary | ICD-10-CM | POA: Diagnosis not present

## 2022-11-21 DIAGNOSIS — B351 Tinea unguium: Secondary | ICD-10-CM | POA: Diagnosis not present

## 2022-11-21 DIAGNOSIS — I4891 Unspecified atrial fibrillation: Secondary | ICD-10-CM | POA: Diagnosis not present

## 2022-11-21 DIAGNOSIS — L89152 Pressure ulcer of sacral region, stage 2: Secondary | ICD-10-CM | POA: Diagnosis not present

## 2022-11-22 DIAGNOSIS — M6281 Muscle weakness (generalized): Secondary | ICD-10-CM | POA: Diagnosis not present

## 2022-11-26 DIAGNOSIS — E559 Vitamin D deficiency, unspecified: Secondary | ICD-10-CM | POA: Diagnosis not present

## 2022-11-26 DIAGNOSIS — I4891 Unspecified atrial fibrillation: Secondary | ICD-10-CM | POA: Diagnosis not present

## 2022-11-26 DIAGNOSIS — F0280 Dementia in other diseases classified elsewhere without behavioral disturbance: Secondary | ICD-10-CM | POA: Diagnosis not present

## 2022-11-26 DIAGNOSIS — I10 Essential (primary) hypertension: Secondary | ICD-10-CM | POA: Diagnosis not present

## 2022-11-26 DIAGNOSIS — E039 Hypothyroidism, unspecified: Secondary | ICD-10-CM | POA: Diagnosis not present

## 2022-11-26 DIAGNOSIS — G309 Alzheimer's disease, unspecified: Secondary | ICD-10-CM | POA: Diagnosis not present

## 2022-11-26 DIAGNOSIS — H9190 Unspecified hearing loss, unspecified ear: Secondary | ICD-10-CM | POA: Diagnosis not present

## 2022-11-27 DIAGNOSIS — L89152 Pressure ulcer of sacral region, stage 2: Secondary | ICD-10-CM | POA: Diagnosis not present

## 2022-11-27 DIAGNOSIS — S91302A Unspecified open wound, left foot, initial encounter: Secondary | ICD-10-CM | POA: Diagnosis not present

## 2022-11-29 DIAGNOSIS — L89152 Pressure ulcer of sacral region, stage 2: Secondary | ICD-10-CM | POA: Diagnosis not present

## 2022-11-29 DIAGNOSIS — B351 Tinea unguium: Secondary | ICD-10-CM | POA: Diagnosis not present

## 2022-11-29 DIAGNOSIS — D649 Anemia, unspecified: Secondary | ICD-10-CM | POA: Diagnosis not present

## 2022-11-29 DIAGNOSIS — R630 Anorexia: Secondary | ICD-10-CM | POA: Diagnosis not present

## 2022-11-29 DIAGNOSIS — E559 Vitamin D deficiency, unspecified: Secondary | ICD-10-CM | POA: Diagnosis not present

## 2022-11-29 DIAGNOSIS — J849 Interstitial pulmonary disease, unspecified: Secondary | ICD-10-CM | POA: Diagnosis not present

## 2022-11-29 DIAGNOSIS — G309 Alzheimer's disease, unspecified: Secondary | ICD-10-CM | POA: Diagnosis not present

## 2022-11-29 DIAGNOSIS — E038 Other specified hypothyroidism: Secondary | ICD-10-CM | POA: Diagnosis not present

## 2022-11-29 DIAGNOSIS — E538 Deficiency of other specified B group vitamins: Secondary | ICD-10-CM | POA: Diagnosis not present

## 2022-12-04 DIAGNOSIS — S91302A Unspecified open wound, left foot, initial encounter: Secondary | ICD-10-CM | POA: Diagnosis not present

## 2022-12-04 DIAGNOSIS — L89152 Pressure ulcer of sacral region, stage 2: Secondary | ICD-10-CM | POA: Diagnosis not present

## 2022-12-05 DIAGNOSIS — M6281 Muscle weakness (generalized): Secondary | ICD-10-CM | POA: Diagnosis not present

## 2022-12-05 DIAGNOSIS — Z23 Encounter for immunization: Secondary | ICD-10-CM | POA: Diagnosis not present

## 2022-12-10 DIAGNOSIS — M545 Low back pain, unspecified: Secondary | ICD-10-CM | POA: Diagnosis not present

## 2022-12-11 DIAGNOSIS — S91302A Unspecified open wound, left foot, initial encounter: Secondary | ICD-10-CM | POA: Diagnosis not present

## 2022-12-11 DIAGNOSIS — I10 Essential (primary) hypertension: Secondary | ICD-10-CM | POA: Diagnosis not present

## 2022-12-11 DIAGNOSIS — L89153 Pressure ulcer of sacral region, stage 3: Secondary | ICD-10-CM | POA: Diagnosis not present

## 2022-12-11 DIAGNOSIS — E559 Vitamin D deficiency, unspecified: Secondary | ICD-10-CM | POA: Diagnosis not present

## 2022-12-12 DIAGNOSIS — I10 Essential (primary) hypertension: Secondary | ICD-10-CM | POA: Diagnosis not present

## 2022-12-12 DIAGNOSIS — I4891 Unspecified atrial fibrillation: Secondary | ICD-10-CM | POA: Diagnosis not present

## 2022-12-12 DIAGNOSIS — E559 Vitamin D deficiency, unspecified: Secondary | ICD-10-CM | POA: Diagnosis not present

## 2022-12-12 DIAGNOSIS — R630 Anorexia: Secondary | ICD-10-CM | POA: Diagnosis not present

## 2022-12-12 DIAGNOSIS — J849 Interstitial pulmonary disease, unspecified: Secondary | ICD-10-CM | POA: Diagnosis not present

## 2022-12-12 DIAGNOSIS — E039 Hypothyroidism, unspecified: Secondary | ICD-10-CM | POA: Diagnosis not present

## 2022-12-12 DIAGNOSIS — D649 Anemia, unspecified: Secondary | ICD-10-CM | POA: Diagnosis not present

## 2022-12-12 DIAGNOSIS — S22080D Wedge compression fracture of T11-T12 vertebra, subsequent encounter for fracture with routine healing: Secondary | ICD-10-CM | POA: Diagnosis not present

## 2022-12-12 DIAGNOSIS — B351 Tinea unguium: Secondary | ICD-10-CM | POA: Diagnosis not present

## 2022-12-18 DIAGNOSIS — L89153 Pressure ulcer of sacral region, stage 3: Secondary | ICD-10-CM | POA: Diagnosis not present

## 2022-12-19 ENCOUNTER — Other Ambulatory Visit: Payer: Self-pay | Admitting: Orthopedic Surgery

## 2022-12-19 DIAGNOSIS — B351 Tinea unguium: Secondary | ICD-10-CM | POA: Diagnosis not present

## 2022-12-19 DIAGNOSIS — M546 Pain in thoracic spine: Secondary | ICD-10-CM

## 2022-12-19 DIAGNOSIS — M5451 Vertebrogenic low back pain: Secondary | ICD-10-CM | POA: Diagnosis not present

## 2022-12-19 DIAGNOSIS — E039 Hypothyroidism, unspecified: Secondary | ICD-10-CM | POA: Diagnosis not present

## 2022-12-19 DIAGNOSIS — G309 Alzheimer's disease, unspecified: Secondary | ICD-10-CM | POA: Diagnosis not present

## 2022-12-19 DIAGNOSIS — D649 Anemia, unspecified: Secondary | ICD-10-CM | POA: Diagnosis not present

## 2022-12-19 DIAGNOSIS — E038 Other specified hypothyroidism: Secondary | ICD-10-CM | POA: Diagnosis not present

## 2022-12-19 DIAGNOSIS — E559 Vitamin D deficiency, unspecified: Secondary | ICD-10-CM | POA: Diagnosis not present

## 2022-12-19 DIAGNOSIS — G629 Polyneuropathy, unspecified: Secondary | ICD-10-CM | POA: Diagnosis not present

## 2022-12-19 DIAGNOSIS — I10 Essential (primary) hypertension: Secondary | ICD-10-CM | POA: Diagnosis not present

## 2022-12-19 DIAGNOSIS — G547 Phantom limb syndrome without pain: Secondary | ICD-10-CM | POA: Diagnosis not present

## 2022-12-19 DIAGNOSIS — R630 Anorexia: Secondary | ICD-10-CM | POA: Diagnosis not present

## 2022-12-25 DIAGNOSIS — L89153 Pressure ulcer of sacral region, stage 3: Secondary | ICD-10-CM | POA: Diagnosis not present

## 2022-12-26 ENCOUNTER — Other Ambulatory Visit (HOSPITAL_COMMUNITY): Payer: Self-pay | Admitting: Orthopedic Surgery

## 2022-12-26 ENCOUNTER — Encounter: Payer: Self-pay | Admitting: Orthopedic Surgery

## 2022-12-26 DIAGNOSIS — I10 Essential (primary) hypertension: Secondary | ICD-10-CM | POA: Diagnosis not present

## 2022-12-26 DIAGNOSIS — F0280 Dementia in other diseases classified elsewhere without behavioral disturbance: Secondary | ICD-10-CM | POA: Diagnosis not present

## 2022-12-26 DIAGNOSIS — G309 Alzheimer's disease, unspecified: Secondary | ICD-10-CM | POA: Diagnosis not present

## 2022-12-26 DIAGNOSIS — E559 Vitamin D deficiency, unspecified: Secondary | ICD-10-CM | POA: Diagnosis not present

## 2022-12-26 DIAGNOSIS — M546 Pain in thoracic spine: Secondary | ICD-10-CM

## 2022-12-26 DIAGNOSIS — E039 Hypothyroidism, unspecified: Secondary | ICD-10-CM | POA: Diagnosis not present

## 2022-12-26 DIAGNOSIS — H9190 Unspecified hearing loss, unspecified ear: Secondary | ICD-10-CM | POA: Diagnosis not present

## 2022-12-26 DIAGNOSIS — I4891 Unspecified atrial fibrillation: Secondary | ICD-10-CM | POA: Diagnosis not present

## 2022-12-26 DIAGNOSIS — M5451 Vertebrogenic low back pain: Secondary | ICD-10-CM

## 2022-12-27 DIAGNOSIS — E559 Vitamin D deficiency, unspecified: Secondary | ICD-10-CM | POA: Diagnosis not present

## 2022-12-27 DIAGNOSIS — I4891 Unspecified atrial fibrillation: Secondary | ICD-10-CM | POA: Diagnosis not present

## 2022-12-27 DIAGNOSIS — I10 Essential (primary) hypertension: Secondary | ICD-10-CM | POA: Diagnosis not present

## 2022-12-27 DIAGNOSIS — S22080D Wedge compression fracture of T11-T12 vertebra, subsequent encounter for fracture with routine healing: Secondary | ICD-10-CM | POA: Diagnosis not present

## 2022-12-27 DIAGNOSIS — D649 Anemia, unspecified: Secondary | ICD-10-CM | POA: Diagnosis not present

## 2022-12-27 DIAGNOSIS — E039 Hypothyroidism, unspecified: Secondary | ICD-10-CM | POA: Diagnosis not present

## 2022-12-27 DIAGNOSIS — B351 Tinea unguium: Secondary | ICD-10-CM | POA: Diagnosis not present

## 2022-12-27 DIAGNOSIS — E038 Other specified hypothyroidism: Secondary | ICD-10-CM | POA: Diagnosis not present

## 2022-12-27 DIAGNOSIS — G309 Alzheimer's disease, unspecified: Secondary | ICD-10-CM | POA: Diagnosis not present

## 2023-01-01 DIAGNOSIS — L89153 Pressure ulcer of sacral region, stage 3: Secondary | ICD-10-CM | POA: Diagnosis not present

## 2023-01-08 DIAGNOSIS — L89153 Pressure ulcer of sacral region, stage 3: Secondary | ICD-10-CM | POA: Diagnosis not present

## 2023-01-15 DIAGNOSIS — L89153 Pressure ulcer of sacral region, stage 3: Secondary | ICD-10-CM | POA: Diagnosis not present

## 2023-01-16 DIAGNOSIS — E038 Other specified hypothyroidism: Secondary | ICD-10-CM | POA: Diagnosis not present

## 2023-01-16 DIAGNOSIS — B351 Tinea unguium: Secondary | ICD-10-CM | POA: Diagnosis not present

## 2023-01-16 DIAGNOSIS — G309 Alzheimer's disease, unspecified: Secondary | ICD-10-CM | POA: Diagnosis not present

## 2023-01-16 DIAGNOSIS — J849 Interstitial pulmonary disease, unspecified: Secondary | ICD-10-CM | POA: Diagnosis not present

## 2023-01-16 DIAGNOSIS — G629 Polyneuropathy, unspecified: Secondary | ICD-10-CM | POA: Diagnosis not present

## 2023-01-16 DIAGNOSIS — I10 Essential (primary) hypertension: Secondary | ICD-10-CM | POA: Diagnosis not present

## 2023-01-16 DIAGNOSIS — S22080D Wedge compression fracture of T11-T12 vertebra, subsequent encounter for fracture with routine healing: Secondary | ICD-10-CM | POA: Diagnosis not present

## 2023-01-16 DIAGNOSIS — E538 Deficiency of other specified B group vitamins: Secondary | ICD-10-CM | POA: Diagnosis not present

## 2023-01-16 DIAGNOSIS — G4700 Insomnia, unspecified: Secondary | ICD-10-CM | POA: Diagnosis not present

## 2023-01-16 DIAGNOSIS — G547 Phantom limb syndrome without pain: Secondary | ICD-10-CM | POA: Diagnosis not present

## 2023-01-16 DIAGNOSIS — R630 Anorexia: Secondary | ICD-10-CM | POA: Diagnosis not present

## 2023-01-16 DIAGNOSIS — I4891 Unspecified atrial fibrillation: Secondary | ICD-10-CM | POA: Diagnosis not present

## 2023-01-22 ENCOUNTER — Ambulatory Visit (HOSPITAL_COMMUNITY): Payer: PPO

## 2023-01-22 DIAGNOSIS — L89153 Pressure ulcer of sacral region, stage 3: Secondary | ICD-10-CM | POA: Diagnosis not present

## 2023-01-24 DIAGNOSIS — J849 Interstitial pulmonary disease, unspecified: Secondary | ICD-10-CM | POA: Diagnosis not present

## 2023-01-24 DIAGNOSIS — I4891 Unspecified atrial fibrillation: Secondary | ICD-10-CM | POA: Diagnosis not present

## 2023-01-24 DIAGNOSIS — B351 Tinea unguium: Secondary | ICD-10-CM | POA: Diagnosis not present

## 2023-01-24 DIAGNOSIS — D649 Anemia, unspecified: Secondary | ICD-10-CM | POA: Diagnosis not present

## 2023-01-24 DIAGNOSIS — E538 Deficiency of other specified B group vitamins: Secondary | ICD-10-CM | POA: Diagnosis not present

## 2023-01-24 DIAGNOSIS — E559 Vitamin D deficiency, unspecified: Secondary | ICD-10-CM | POA: Diagnosis not present

## 2023-01-24 DIAGNOSIS — S22080D Wedge compression fracture of T11-T12 vertebra, subsequent encounter for fracture with routine healing: Secondary | ICD-10-CM | POA: Diagnosis not present

## 2023-01-24 DIAGNOSIS — G4700 Insomnia, unspecified: Secondary | ICD-10-CM | POA: Diagnosis not present

## 2023-01-24 DIAGNOSIS — G309 Alzheimer's disease, unspecified: Secondary | ICD-10-CM | POA: Diagnosis not present

## 2023-01-24 DIAGNOSIS — I10 Essential (primary) hypertension: Secondary | ICD-10-CM | POA: Diagnosis not present

## 2023-01-29 DIAGNOSIS — L89153 Pressure ulcer of sacral region, stage 3: Secondary | ICD-10-CM | POA: Diagnosis not present

## 2023-01-31 DIAGNOSIS — G309 Alzheimer's disease, unspecified: Secondary | ICD-10-CM | POA: Diagnosis not present

## 2023-01-31 DIAGNOSIS — E559 Vitamin D deficiency, unspecified: Secondary | ICD-10-CM | POA: Diagnosis not present

## 2023-01-31 DIAGNOSIS — I4891 Unspecified atrial fibrillation: Secondary | ICD-10-CM | POA: Diagnosis not present

## 2023-01-31 DIAGNOSIS — I10 Essential (primary) hypertension: Secondary | ICD-10-CM | POA: Diagnosis not present

## 2023-01-31 DIAGNOSIS — H9190 Unspecified hearing loss, unspecified ear: Secondary | ICD-10-CM | POA: Diagnosis not present

## 2023-01-31 DIAGNOSIS — F0280 Dementia in other diseases classified elsewhere without behavioral disturbance: Secondary | ICD-10-CM | POA: Diagnosis not present

## 2023-01-31 DIAGNOSIS — E039 Hypothyroidism, unspecified: Secondary | ICD-10-CM | POA: Diagnosis not present

## 2023-02-04 DIAGNOSIS — R630 Anorexia: Secondary | ICD-10-CM | POA: Diagnosis not present

## 2023-02-04 DIAGNOSIS — D649 Anemia, unspecified: Secondary | ICD-10-CM | POA: Diagnosis not present

## 2023-02-04 DIAGNOSIS — E559 Vitamin D deficiency, unspecified: Secondary | ICD-10-CM | POA: Diagnosis not present

## 2023-02-04 DIAGNOSIS — E538 Deficiency of other specified B group vitamins: Secondary | ICD-10-CM | POA: Diagnosis not present

## 2023-02-04 DIAGNOSIS — I4891 Unspecified atrial fibrillation: Secondary | ICD-10-CM | POA: Diagnosis not present

## 2023-02-04 DIAGNOSIS — J849 Interstitial pulmonary disease, unspecified: Secondary | ICD-10-CM | POA: Diagnosis not present

## 2023-02-04 DIAGNOSIS — B351 Tinea unguium: Secondary | ICD-10-CM | POA: Diagnosis not present

## 2023-02-04 DIAGNOSIS — S22080D Wedge compression fracture of T11-T12 vertebra, subsequent encounter for fracture with routine healing: Secondary | ICD-10-CM | POA: Diagnosis not present

## 2023-02-04 DIAGNOSIS — E038 Other specified hypothyroidism: Secondary | ICD-10-CM | POA: Diagnosis not present

## 2023-02-04 DIAGNOSIS — G309 Alzheimer's disease, unspecified: Secondary | ICD-10-CM | POA: Diagnosis not present

## 2023-02-04 DIAGNOSIS — G629 Polyneuropathy, unspecified: Secondary | ICD-10-CM | POA: Diagnosis not present

## 2023-02-05 DIAGNOSIS — L89153 Pressure ulcer of sacral region, stage 3: Secondary | ICD-10-CM | POA: Diagnosis not present

## 2023-02-06 DIAGNOSIS — I4891 Unspecified atrial fibrillation: Secondary | ICD-10-CM | POA: Diagnosis not present

## 2023-02-06 DIAGNOSIS — I482 Chronic atrial fibrillation, unspecified: Secondary | ICD-10-CM | POA: Diagnosis not present

## 2023-02-06 DIAGNOSIS — D649 Anemia, unspecified: Secondary | ICD-10-CM | POA: Diagnosis not present

## 2023-02-06 DIAGNOSIS — G309 Alzheimer's disease, unspecified: Secondary | ICD-10-CM | POA: Diagnosis not present

## 2023-02-06 DIAGNOSIS — B351 Tinea unguium: Secondary | ICD-10-CM | POA: Diagnosis not present

## 2023-02-06 DIAGNOSIS — R630 Anorexia: Secondary | ICD-10-CM | POA: Diagnosis not present

## 2023-02-06 DIAGNOSIS — S22080D Wedge compression fracture of T11-T12 vertebra, subsequent encounter for fracture with routine healing: Secondary | ICD-10-CM | POA: Diagnosis not present

## 2023-02-06 DIAGNOSIS — R41841 Cognitive communication deficit: Secondary | ICD-10-CM | POA: Diagnosis not present

## 2023-02-06 DIAGNOSIS — G547 Phantom limb syndrome without pain: Secondary | ICD-10-CM | POA: Diagnosis not present

## 2023-02-06 DIAGNOSIS — E559 Vitamin D deficiency, unspecified: Secondary | ICD-10-CM | POA: Diagnosis not present

## 2023-02-06 DIAGNOSIS — G629 Polyneuropathy, unspecified: Secondary | ICD-10-CM | POA: Diagnosis not present

## 2023-02-06 DIAGNOSIS — J849 Interstitial pulmonary disease, unspecified: Secondary | ICD-10-CM | POA: Diagnosis not present

## 2023-02-06 DIAGNOSIS — E038 Other specified hypothyroidism: Secondary | ICD-10-CM | POA: Diagnosis not present

## 2023-02-06 DIAGNOSIS — R1312 Dysphagia, oropharyngeal phase: Secondary | ICD-10-CM | POA: Diagnosis not present

## 2023-02-06 DIAGNOSIS — E538 Deficiency of other specified B group vitamins: Secondary | ICD-10-CM | POA: Diagnosis not present

## 2023-02-07 DIAGNOSIS — R051 Acute cough: Secondary | ICD-10-CM | POA: Diagnosis not present

## 2023-02-07 DIAGNOSIS — R0989 Other specified symptoms and signs involving the circulatory and respiratory systems: Secondary | ICD-10-CM | POA: Diagnosis not present

## 2023-02-07 DIAGNOSIS — R059 Cough, unspecified: Secondary | ICD-10-CM | POA: Diagnosis not present

## 2023-02-08 DIAGNOSIS — S22080D Wedge compression fracture of T11-T12 vertebra, subsequent encounter for fracture with routine healing: Secondary | ICD-10-CM | POA: Diagnosis not present

## 2023-02-08 DIAGNOSIS — E559 Vitamin D deficiency, unspecified: Secondary | ICD-10-CM | POA: Diagnosis not present

## 2023-02-08 DIAGNOSIS — R0989 Other specified symptoms and signs involving the circulatory and respiratory systems: Secondary | ICD-10-CM | POA: Diagnosis not present

## 2023-02-08 DIAGNOSIS — D649 Anemia, unspecified: Secondary | ICD-10-CM | POA: Diagnosis not present

## 2023-02-08 DIAGNOSIS — E538 Deficiency of other specified B group vitamins: Secondary | ICD-10-CM | POA: Diagnosis not present

## 2023-02-08 DIAGNOSIS — R630 Anorexia: Secondary | ICD-10-CM | POA: Diagnosis not present

## 2023-02-08 DIAGNOSIS — J849 Interstitial pulmonary disease, unspecified: Secondary | ICD-10-CM | POA: Diagnosis not present

## 2023-02-08 DIAGNOSIS — G629 Polyneuropathy, unspecified: Secondary | ICD-10-CM | POA: Diagnosis not present

## 2023-02-08 DIAGNOSIS — I4891 Unspecified atrial fibrillation: Secondary | ICD-10-CM | POA: Diagnosis not present

## 2023-02-08 DIAGNOSIS — G309 Alzheimer's disease, unspecified: Secondary | ICD-10-CM | POA: Diagnosis not present

## 2023-02-08 DIAGNOSIS — I10 Essential (primary) hypertension: Secondary | ICD-10-CM | POA: Diagnosis not present

## 2023-02-08 DIAGNOSIS — K7460 Unspecified cirrhosis of liver: Secondary | ICD-10-CM | POA: Diagnosis not present

## 2023-02-12 DIAGNOSIS — L89153 Pressure ulcer of sacral region, stage 3: Secondary | ICD-10-CM | POA: Diagnosis not present

## 2023-02-13 DIAGNOSIS — B351 Tinea unguium: Secondary | ICD-10-CM | POA: Diagnosis not present

## 2023-02-13 DIAGNOSIS — I4891 Unspecified atrial fibrillation: Secondary | ICD-10-CM | POA: Diagnosis not present

## 2023-02-13 DIAGNOSIS — I10 Essential (primary) hypertension: Secondary | ICD-10-CM | POA: Diagnosis not present

## 2023-02-13 DIAGNOSIS — S22080D Wedge compression fracture of T11-T12 vertebra, subsequent encounter for fracture with routine healing: Secondary | ICD-10-CM | POA: Diagnosis not present

## 2023-02-13 DIAGNOSIS — J849 Interstitial pulmonary disease, unspecified: Secondary | ICD-10-CM | POA: Diagnosis not present

## 2023-02-13 DIAGNOSIS — E559 Vitamin D deficiency, unspecified: Secondary | ICD-10-CM | POA: Diagnosis not present

## 2023-02-13 DIAGNOSIS — E538 Deficiency of other specified B group vitamins: Secondary | ICD-10-CM | POA: Diagnosis not present

## 2023-02-13 DIAGNOSIS — E039 Hypothyroidism, unspecified: Secondary | ICD-10-CM | POA: Diagnosis not present

## 2023-02-13 DIAGNOSIS — R630 Anorexia: Secondary | ICD-10-CM | POA: Diagnosis not present

## 2023-02-13 DIAGNOSIS — D649 Anemia, unspecified: Secondary | ICD-10-CM | POA: Diagnosis not present

## 2023-02-18 DIAGNOSIS — R0989 Other specified symptoms and signs involving the circulatory and respiratory systems: Secondary | ICD-10-CM | POA: Diagnosis not present

## 2023-02-18 DIAGNOSIS — I10 Essential (primary) hypertension: Secondary | ICD-10-CM | POA: Diagnosis not present

## 2023-02-18 DIAGNOSIS — R630 Anorexia: Secondary | ICD-10-CM | POA: Diagnosis not present

## 2023-02-18 DIAGNOSIS — E538 Deficiency of other specified B group vitamins: Secondary | ICD-10-CM | POA: Diagnosis not present

## 2023-02-18 DIAGNOSIS — B351 Tinea unguium: Secondary | ICD-10-CM | POA: Diagnosis not present

## 2023-02-18 DIAGNOSIS — R1310 Dysphagia, unspecified: Secondary | ICD-10-CM | POA: Diagnosis not present

## 2023-02-18 DIAGNOSIS — D649 Anemia, unspecified: Secondary | ICD-10-CM | POA: Diagnosis not present

## 2023-02-18 DIAGNOSIS — E559 Vitamin D deficiency, unspecified: Secondary | ICD-10-CM | POA: Diagnosis not present

## 2023-02-19 DIAGNOSIS — S71001A Unspecified open wound, right hip, initial encounter: Secondary | ICD-10-CM | POA: Diagnosis not present

## 2023-02-19 DIAGNOSIS — L89153 Pressure ulcer of sacral region, stage 3: Secondary | ICD-10-CM | POA: Diagnosis not present

## 2023-02-20 DIAGNOSIS — R1310 Dysphagia, unspecified: Secondary | ICD-10-CM | POA: Diagnosis not present

## 2023-02-20 DIAGNOSIS — E538 Deficiency of other specified B group vitamins: Secondary | ICD-10-CM | POA: Diagnosis not present

## 2023-02-20 DIAGNOSIS — I4891 Unspecified atrial fibrillation: Secondary | ICD-10-CM | POA: Diagnosis not present

## 2023-02-20 DIAGNOSIS — J849 Interstitial pulmonary disease, unspecified: Secondary | ICD-10-CM | POA: Diagnosis not present

## 2023-02-20 DIAGNOSIS — R630 Anorexia: Secondary | ICD-10-CM | POA: Diagnosis not present

## 2023-02-20 DIAGNOSIS — I10 Essential (primary) hypertension: Secondary | ICD-10-CM | POA: Diagnosis not present

## 2023-02-20 DIAGNOSIS — E559 Vitamin D deficiency, unspecified: Secondary | ICD-10-CM | POA: Diagnosis not present

## 2023-02-21 DIAGNOSIS — K224 Dyskinesia of esophagus: Secondary | ICD-10-CM | POA: Diagnosis not present

## 2023-02-21 DIAGNOSIS — R1310 Dysphagia, unspecified: Secondary | ICD-10-CM | POA: Diagnosis not present

## 2023-02-21 DIAGNOSIS — G309 Alzheimer's disease, unspecified: Secondary | ICD-10-CM | POA: Diagnosis not present

## 2023-02-21 DIAGNOSIS — R630 Anorexia: Secondary | ICD-10-CM | POA: Diagnosis not present

## 2023-02-21 DIAGNOSIS — K7460 Unspecified cirrhosis of liver: Secondary | ICD-10-CM | POA: Diagnosis not present

## 2023-02-21 DIAGNOSIS — E559 Vitamin D deficiency, unspecified: Secondary | ICD-10-CM | POA: Diagnosis not present

## 2023-02-21 DIAGNOSIS — R0989 Other specified symptoms and signs involving the circulatory and respiratory systems: Secondary | ICD-10-CM | POA: Diagnosis not present

## 2023-02-21 DIAGNOSIS — D649 Anemia, unspecified: Secondary | ICD-10-CM | POA: Diagnosis not present

## 2023-02-21 DIAGNOSIS — I10 Essential (primary) hypertension: Secondary | ICD-10-CM | POA: Diagnosis not present

## 2023-02-21 DIAGNOSIS — S22080D Wedge compression fracture of T11-T12 vertebra, subsequent encounter for fracture with routine healing: Secondary | ICD-10-CM | POA: Diagnosis not present

## 2023-02-21 DIAGNOSIS — B351 Tinea unguium: Secondary | ICD-10-CM | POA: Diagnosis not present

## 2023-02-26 DIAGNOSIS — L89153 Pressure ulcer of sacral region, stage 3: Secondary | ICD-10-CM | POA: Diagnosis not present

## 2023-02-26 DIAGNOSIS — S71001A Unspecified open wound, right hip, initial encounter: Secondary | ICD-10-CM | POA: Diagnosis not present

## 2023-02-28 ENCOUNTER — Ambulatory Visit: Payer: PPO | Admitting: Nurse Practitioner

## 2023-02-28 DIAGNOSIS — J811 Chronic pulmonary edema: Secondary | ICD-10-CM | POA: Diagnosis not present

## 2023-03-01 DIAGNOSIS — F0280 Dementia in other diseases classified elsewhere without behavioral disturbance: Secondary | ICD-10-CM | POA: Diagnosis not present

## 2023-03-01 DIAGNOSIS — H9190 Unspecified hearing loss, unspecified ear: Secondary | ICD-10-CM | POA: Diagnosis not present

## 2023-03-01 DIAGNOSIS — I10 Essential (primary) hypertension: Secondary | ICD-10-CM | POA: Diagnosis not present

## 2023-03-01 DIAGNOSIS — J189 Pneumonia, unspecified organism: Secondary | ICD-10-CM | POA: Diagnosis not present

## 2023-03-01 DIAGNOSIS — I4891 Unspecified atrial fibrillation: Secondary | ICD-10-CM | POA: Diagnosis not present

## 2023-03-01 DIAGNOSIS — E039 Hypothyroidism, unspecified: Secondary | ICD-10-CM | POA: Diagnosis not present

## 2023-03-01 DIAGNOSIS — E559 Vitamin D deficiency, unspecified: Secondary | ICD-10-CM | POA: Diagnosis not present

## 2023-03-01 DIAGNOSIS — G309 Alzheimer's disease, unspecified: Secondary | ICD-10-CM | POA: Diagnosis not present

## 2023-03-04 DIAGNOSIS — Z515 Encounter for palliative care: Secondary | ICD-10-CM | POA: Diagnosis not present

## 2023-03-05 DIAGNOSIS — S71001A Unspecified open wound, right hip, initial encounter: Secondary | ICD-10-CM | POA: Diagnosis not present

## 2023-03-05 DIAGNOSIS — L89153 Pressure ulcer of sacral region, stage 3: Secondary | ICD-10-CM | POA: Diagnosis not present

## 2023-03-06 DIAGNOSIS — S22080D Wedge compression fracture of T11-T12 vertebra, subsequent encounter for fracture with routine healing: Secondary | ICD-10-CM | POA: Diagnosis not present

## 2023-03-06 DIAGNOSIS — E538 Deficiency of other specified B group vitamins: Secondary | ICD-10-CM | POA: Diagnosis not present

## 2023-03-06 DIAGNOSIS — E038 Other specified hypothyroidism: Secondary | ICD-10-CM | POA: Diagnosis not present

## 2023-03-06 DIAGNOSIS — I4891 Unspecified atrial fibrillation: Secondary | ICD-10-CM | POA: Diagnosis not present

## 2023-03-06 DIAGNOSIS — R41841 Cognitive communication deficit: Secondary | ICD-10-CM | POA: Diagnosis not present

## 2023-03-06 DIAGNOSIS — I10 Essential (primary) hypertension: Secondary | ICD-10-CM | POA: Diagnosis not present

## 2023-03-06 DIAGNOSIS — R630 Anorexia: Secondary | ICD-10-CM | POA: Diagnosis not present

## 2023-03-06 DIAGNOSIS — R1312 Dysphagia, oropharyngeal phase: Secondary | ICD-10-CM | POA: Diagnosis not present

## 2023-03-06 DIAGNOSIS — J849 Interstitial pulmonary disease, unspecified: Secondary | ICD-10-CM | POA: Diagnosis not present

## 2023-03-06 DIAGNOSIS — J189 Pneumonia, unspecified organism: Secondary | ICD-10-CM | POA: Diagnosis not present

## 2023-03-06 DIAGNOSIS — G547 Phantom limb syndrome without pain: Secondary | ICD-10-CM | POA: Diagnosis not present

## 2023-03-06 DIAGNOSIS — I482 Chronic atrial fibrillation, unspecified: Secondary | ICD-10-CM | POA: Diagnosis not present

## 2023-03-06 DIAGNOSIS — G309 Alzheimer's disease, unspecified: Secondary | ICD-10-CM | POA: Diagnosis not present

## 2023-03-06 DIAGNOSIS — B351 Tinea unguium: Secondary | ICD-10-CM | POA: Diagnosis not present

## 2023-03-06 DIAGNOSIS — G629 Polyneuropathy, unspecified: Secondary | ICD-10-CM | POA: Diagnosis not present

## 2023-03-08 ENCOUNTER — Ambulatory Visit: Payer: PPO | Admitting: Nurse Practitioner

## 2023-03-08 ENCOUNTER — Emergency Department (HOSPITAL_COMMUNITY): Payer: PPO

## 2023-03-08 ENCOUNTER — Inpatient Hospital Stay (HOSPITAL_COMMUNITY)
Admission: EM | Admit: 2023-03-08 | Discharge: 2023-04-07 | Disposition: E | Payer: PPO | Attending: Internal Medicine | Admitting: Internal Medicine

## 2023-03-08 ENCOUNTER — Encounter (HOSPITAL_COMMUNITY): Payer: Self-pay

## 2023-03-08 ENCOUNTER — Other Ambulatory Visit: Payer: Self-pay

## 2023-03-08 DIAGNOSIS — R64 Cachexia: Secondary | ICD-10-CM | POA: Diagnosis present

## 2023-03-08 DIAGNOSIS — N281 Cyst of kidney, acquired: Secondary | ICD-10-CM | POA: Diagnosis not present

## 2023-03-08 DIAGNOSIS — D72819 Decreased white blood cell count, unspecified: Secondary | ICD-10-CM | POA: Diagnosis present

## 2023-03-08 DIAGNOSIS — R0600 Dyspnea, unspecified: Secondary | ICD-10-CM

## 2023-03-08 DIAGNOSIS — Z66 Do not resuscitate: Secondary | ICD-10-CM

## 2023-03-08 DIAGNOSIS — I482 Chronic atrial fibrillation, unspecified: Secondary | ICD-10-CM | POA: Diagnosis present

## 2023-03-08 DIAGNOSIS — F0280 Dementia in other diseases classified elsewhere without behavioral disturbance: Secondary | ICD-10-CM | POA: Diagnosis present

## 2023-03-08 DIAGNOSIS — G309 Alzheimer's disease, unspecified: Secondary | ICD-10-CM | POA: Diagnosis present

## 2023-03-08 DIAGNOSIS — N132 Hydronephrosis with renal and ureteral calculous obstruction: Secondary | ICD-10-CM | POA: Diagnosis not present

## 2023-03-08 DIAGNOSIS — Z6824 Body mass index (BMI) 24.0-24.9, adult: Secondary | ICD-10-CM

## 2023-03-08 DIAGNOSIS — I469 Cardiac arrest, cause unspecified: Secondary | ICD-10-CM | POA: Diagnosis not present

## 2023-03-08 DIAGNOSIS — Z8701 Personal history of pneumonia (recurrent): Secondary | ICD-10-CM

## 2023-03-08 DIAGNOSIS — Z87891 Personal history of nicotine dependence: Secondary | ICD-10-CM

## 2023-03-08 DIAGNOSIS — J849 Interstitial pulmonary disease, unspecified: Secondary | ICD-10-CM | POA: Diagnosis present

## 2023-03-08 DIAGNOSIS — I10 Essential (primary) hypertension: Secondary | ICD-10-CM | POA: Diagnosis not present

## 2023-03-08 DIAGNOSIS — I872 Venous insufficiency (chronic) (peripheral): Secondary | ICD-10-CM | POA: Diagnosis present

## 2023-03-08 DIAGNOSIS — K567 Ileus, unspecified: Secondary | ICD-10-CM | POA: Diagnosis present

## 2023-03-08 DIAGNOSIS — Z515 Encounter for palliative care: Secondary | ICD-10-CM

## 2023-03-08 DIAGNOSIS — Z7189 Other specified counseling: Secondary | ICD-10-CM

## 2023-03-08 DIAGNOSIS — Z89511 Acquired absence of right leg below knee: Secondary | ICD-10-CM

## 2023-03-08 DIAGNOSIS — Z993 Dependence on wheelchair: Secondary | ICD-10-CM

## 2023-03-08 DIAGNOSIS — R54 Age-related physical debility: Secondary | ICD-10-CM | POA: Diagnosis present

## 2023-03-08 DIAGNOSIS — N136 Pyonephrosis: Secondary | ICD-10-CM | POA: Diagnosis present

## 2023-03-08 DIAGNOSIS — R1310 Dysphagia, unspecified: Secondary | ICD-10-CM | POA: Diagnosis present

## 2023-03-08 DIAGNOSIS — Q438 Other specified congenital malformations of intestine: Secondary | ICD-10-CM | POA: Diagnosis not present

## 2023-03-08 DIAGNOSIS — K5641 Fecal impaction: Secondary | ICD-10-CM | POA: Diagnosis not present

## 2023-03-08 DIAGNOSIS — A419 Sepsis, unspecified organism: Principal | ICD-10-CM | POA: Diagnosis present

## 2023-03-08 DIAGNOSIS — R4589 Other symptoms and signs involving emotional state: Secondary | ICD-10-CM

## 2023-03-08 DIAGNOSIS — G629 Polyneuropathy, unspecified: Secondary | ICD-10-CM | POA: Diagnosis present

## 2023-03-08 DIAGNOSIS — E860 Dehydration: Secondary | ICD-10-CM | POA: Diagnosis present

## 2023-03-08 DIAGNOSIS — Z7901 Long term (current) use of anticoagulants: Secondary | ICD-10-CM

## 2023-03-08 DIAGNOSIS — J189 Pneumonia, unspecified organism: Secondary | ICD-10-CM | POA: Diagnosis not present

## 2023-03-08 DIAGNOSIS — K7469 Other cirrhosis of liver: Secondary | ICD-10-CM | POA: Diagnosis present

## 2023-03-08 DIAGNOSIS — N201 Calculus of ureter: Secondary | ICD-10-CM | POA: Insufficient documentation

## 2023-03-08 DIAGNOSIS — Z820 Family history of epilepsy and other diseases of the nervous system: Secondary | ICD-10-CM

## 2023-03-08 DIAGNOSIS — Z79899 Other long term (current) drug therapy: Secondary | ICD-10-CM

## 2023-03-08 DIAGNOSIS — R140 Abdominal distension (gaseous): Secondary | ICD-10-CM | POA: Diagnosis not present

## 2023-03-08 HISTORY — DX: Unspecified cirrhosis of liver: K74.60

## 2023-03-08 HISTORY — DX: Unspecified dementia, unspecified severity, without behavioral disturbance, psychotic disturbance, mood disturbance, and anxiety: F03.90

## 2023-03-08 LAB — CBC WITH DIFFERENTIAL/PLATELET
Abs Immature Granulocytes: 0.03 10*3/uL (ref 0.00–0.07)
Basophils Absolute: 0 10*3/uL (ref 0.0–0.1)
Basophils Relative: 0 %
Eosinophils Absolute: 0.4 10*3/uL (ref 0.0–0.5)
Eosinophils Relative: 4 %
HCT: 36 % — ABNORMAL LOW (ref 39.0–52.0)
Hemoglobin: 11.8 g/dL — ABNORMAL LOW (ref 13.0–17.0)
Immature Granulocytes: 0 %
Lymphocytes Relative: 20 %
Lymphs Abs: 2 10*3/uL (ref 0.7–4.0)
MCH: 33.8 pg (ref 26.0–34.0)
MCHC: 32.8 g/dL (ref 30.0–36.0)
MCV: 103.2 fL — ABNORMAL HIGH (ref 80.0–100.0)
Monocytes Absolute: 0.8 10*3/uL (ref 0.1–1.0)
Monocytes Relative: 8 %
Neutro Abs: 6.7 10*3/uL (ref 1.7–7.7)
Neutrophils Relative %: 68 %
Platelets: 248 10*3/uL (ref 150–400)
RBC: 3.49 MIL/uL — ABNORMAL LOW (ref 4.22–5.81)
RDW: 15.1 % (ref 11.5–15.5)
WBC: 10.1 10*3/uL (ref 4.0–10.5)
nRBC: 0 % (ref 0.0–0.2)

## 2023-03-08 LAB — COMPREHENSIVE METABOLIC PANEL
ALT: 11 U/L (ref 0–44)
AST: 18 U/L (ref 15–41)
Albumin: 3 g/dL — ABNORMAL LOW (ref 3.5–5.0)
Alkaline Phosphatase: 62 U/L (ref 38–126)
Anion gap: 6 (ref 5–15)
BUN: 19 mg/dL (ref 8–23)
CO2: 30 mmol/L (ref 22–32)
Calcium: 8.9 mg/dL (ref 8.9–10.3)
Chloride: 102 mmol/L (ref 98–111)
Creatinine, Ser: 0.85 mg/dL (ref 0.61–1.24)
GFR, Estimated: >60 mL/min (ref >60)
Glucose, Bld: 105 mg/dL — ABNORMAL HIGH (ref 70–99)
Potassium: 3.8 mmol/L (ref 3.5–5.1)
Sodium: 138 mmol/L (ref 135–145)
Total Bilirubin: 0.6 mg/dL (ref 0.3–1.2)
Total Protein: 7.1 g/dL (ref 6.5–8.1)

## 2023-03-08 LAB — URINALYSIS, W/ REFLEX TO CULTURE (INFECTION SUSPECTED)
Bilirubin Urine: NEGATIVE
Glucose, UA: NEGATIVE mg/dL
Ketones, ur: NEGATIVE mg/dL
Nitrite: POSITIVE — AB
Protein, ur: 30 mg/dL — AB
RBC / HPF: >50 RBC/hpf (ref 0–5)
Specific Gravity, Urine: 1.021 (ref 1.005–1.030)
WBC, UA: >50 WBC/hpf (ref 0–5)
pH: 5 (ref 5.0–8.0)

## 2023-03-08 LAB — LIPASE, BLOOD: Lipase: 38 U/L (ref 11–51)

## 2023-03-08 MED ORDER — SODIUM CHLORIDE 0.9 % IV BOLUS
1000.0000 mL | Freq: Once | INTRAVENOUS | Status: AC
  Administered 2023-03-08: 1000 mL via INTRAVENOUS

## 2023-03-08 MED ORDER — FLEET ENEMA 7-19 GM/118ML RE ENEM
1.0000 | ENEMA | Freq: Once | RECTAL | Status: AC
  Administered 2023-03-09: 1 via RECTAL
  Filled 2023-03-08: qty 1

## 2023-03-08 MED ORDER — SODIUM CHLORIDE 0.9 % IV SOLN
2.0000 g | Freq: Three times a day (TID) | INTRAVENOUS | Status: DC
  Administered 2023-03-08 – 2023-03-09 (×3): 2 g via INTRAVENOUS
  Filled 2023-03-08 (×4): qty 12.5

## 2023-03-08 MED ORDER — IOHEXOL 300 MG/ML  SOLN
100.0000 mL | Freq: Once | INTRAMUSCULAR | Status: AC | PRN
  Administered 2023-03-08: 100 mL via INTRAVENOUS

## 2023-03-08 MED ORDER — TAMSULOSIN HCL 0.4 MG PO CAPS
0.4000 mg | ORAL_CAPSULE | Freq: Every day | ORAL | Status: DC
  Filled 2023-03-08: qty 1

## 2023-03-08 NOTE — ED Notes (Signed)
Patient transported to CT 

## 2023-03-08 NOTE — ED Provider Notes (Signed)
[place] EMERGENCY DEPARTMENT AT [facility] Provider Note   CSN: [id number] Arrival date & time: 03/31/2023  1745     History  Chief Complaint  Patient presents with   Weakness    Brett Coey. is a 82 y.o. male history of dementia, recent pneumonia, here presenting with abdominal distention.  Patient recently had pneumonia and had a follow-up chest x-ray.  On a follow-up chest x-ray, his pneumonia has resolved.  However he has some gaseous distention.  A KUB was ordered but was not performed yet.  Family requested that patient get transferred to the ER for further evaluation.  Patient is demented and unable to give much history.  Denies any vomiting or constipation or fever.  He denies any abdominal pain  The history is provided by the patient.       Home Medications Prior to Admission medications   Medication Sig Start Date End Date Taking? Authorizing Provider  Ascorbic Acid (VITAMIN C) 1000 MG tablet Take 1,000 mg by mouth daily.    [provider]  cephALEXin (KEFLEX) 500 MG capsule Take 1 capsule (500 mg total) by mouth 4 (four) times daily. 04/23/22   Prosperi, Christian H, PA-C  cholecalciferol (VITAMIN D) 1000 UNITS tablet Take 2,000 Units by mouth daily.    [provider]  Cyanocobalamin (VITAMIN B-12 PO) Take by mouth daily.    [provider]  donepezil (ARICEPT) 10 MG tablet Take 1 tablet (10 mg total) by mouth at bedtime. Patient not taking: No sig reported 04/18/21   [name], Elige Radon, MD  Magnesium Gluconate 500 (27 Mg) MG TABS Take 500 mg by mouth.    [provider]  melatonin 3 MG TABS tablet Take 3 tablets (9 mg total) by mouth at bedtime. 06/22/21   Catarina Hartshorn, MD  metoprolol succinate (TOPROL-XL) 25 MG 24 hr tablet Take 1 tablet (25 mg total) by mouth daily. 05/31/21   [name], Elige Radon, MD  Multiple Vitamin (MULTIVITAMIN) tablet Take 1 tablet by mouth daily.    [provider]  warfarin (COUMADIN)  2 MG tablet 6 mg on every day except Mondays and Fridays, take 4 mg on Monday and Friday 05/31/21   [name], Elige Radon, MD      Allergies    Amoxicillin    Review of Systems   Review of Systems  Neurological:  Positive for weakness.  All other systems reviewed and are negative.   Physical Exam Updated Vital Signs BP 109/63   Pulse 74   Temp 98.6 F (37 C) (Oral)   Resp 18   Ht 6\' 2"  (1.88 m)   Wt 86 kg   SpO2 99%   BMI 24.34 kg/m  Physical Exam Vitals and nursing note reviewed.  HENT:     Head: Normocephalic.     Nose: Nose normal.     Mouth/Throat:     Mouth: Mucous membranes are moist.  Eyes:     Extraocular Movements: Extraocular movements intact.     Pupils: Pupils are equal, round, and reactive to light.  Cardiovascular:     Rate and Rhythm: Normal rate and regular rhythm.     Pulses: Normal pulses.     Heart sounds: Normal heart sounds.  Pulmonary:     Effort: Pulmonary effort is normal.     Breath sounds: Normal breath sounds.  Abdominal:     Comments: Slightly distended and nontender  Musculoskeletal:        General: Normal range  of motion.     Cervical back: Normal range of motion and neck supple.  Skin:    General: Skin is warm.     Capillary Refill: Capillary refill takes less than 2 seconds.  Neurological:     General: No focal deficit present.     Mental Status: He is alert.     Comments: Demented and moving all extremities  Psychiatric:        Mood and Affect: Mood normal.        Behavior: Behavior normal.     ED Results / Procedures / Treatments   Labs (all labs ordered are listed, but only abnormal results are displayed) Labs Reviewed  CBC WITH DIFFERENTIAL/PLATELET - Abnormal; Notable for the following components:      Result Value   RBC 3.49 (*)    Hemoglobin 11.8 (*)    HCT 36.0 (*)    MCV 103.2 (*)    All other components within normal limits  COMPREHENSIVE METABOLIC PANEL - Abnormal; Notable for the following components:    Glucose, Bld 105 (*)    Albumin 3.0 (*)    All other components within normal limits  LIPASE, BLOOD  URINALYSIS, W/ REFLEX TO CULTURE (INFECTION SUSPECTED)    EKG None  Radiology CT ABDOMEN PELVIS W CONTRAST  Result Date: 03/15/2023 CLINICAL DATA:  Acute nonlocalized abdominal pain, bowel obstruction EXAM: CT ABDOMEN AND PELVIS WITH CONTRAST TECHNIQUE: Multidetector CT imaging of the abdomen and pelvis was performed using the standard protocol following bolus administration of intravenous contrast. RADIATION DOSE REDUCTION: This exam was performed according to the departmental dose-optimization program which includes automated exposure control, adjustment of the mA and/or kV according to patient size and/or use of iterative reconstruction technique. CONTRAST:  OMNIPAQUE IOHEXOL 300 MG/ML  SOLN COMPARISON:  None Available. 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 This is not fully assessed on this examination. No pleural effusion. Cardiomegaly again noted with biatrial enlargement. Calcification of the aortic valve leaflets. 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 Hepatobiliary: No focal liver abnormality is seen. No gallstones, gallbladder wall thickening, or biliary dilatation. Pancreas: Unremarkable. No pancreatic ductal dilatation or surrounding inflammatory changes. Spleen: Normal in size without focal abnormality. Adrenals/Urinary Tract: The adrenal glands are unremarkable. The kidneys are normal in size and position. Mild right hydronephrosis is present with an obstructing 5 mm calculus seen within the mid right ureter at the pelvic inlet (axial image # 77/11). Superimposed 4 mm  nonobstructing calculus within the interpolar right kidney. No nephro or urolithiasis on the left. No hydronephrosis on the left. Simple cortical cyst noted within the lower pole the right kidney for which no follow-up imaging is recommended. The bladder is unremarkable. Stomach/Bowel: Large volume stool is seen within the rectal vault suggesting changes of fecal impaction there is progressive gaseous distension of the more proximal mid and distal colon which may reflect changes of a superimposed colonic ileus or resultant large bowel obstruction. The sigmoid colon is redundant and measures up to 10 cm in diameter. No evidence of volvulus, however. No superimposed inflammatory change. No free intraperitoneal gas or fluid. No pneumatosis. Stomach, small bowel, and appendix are normal. Vascular/Lymphatic: Aortic atherosclerosis. No enlarged abdominal or pelvic lymph nodes. Reproductive: Prostate is unremarkable. Other: No abdominal wall hernia Musculoskeletal: Status post right total hip arthroplasty.  Osseous structures are diffusely osteopenic. Diffuse advanced degenerative changes are seen throughout the visualized thoracolumbar spine with probable degenerative ankylosis of L1-L3 vertebral bodies. No acute bone abnormality. IMPRESSION: 1. Obstructing 5 mm calculus within the mid right ureter at the pelvic inlet with associated mild right hydronephrosis. Superimposed 4 mm nonobstructing calculus within the interpolar right kidney. 2. Large volume stool within the rectal vault suggesting changes of fecal impaction. Progressive gaseous distension of the more proximal mid and distal colon which may reflect changes of a superimposed colonic ileus or resultant large bowel obstruction. No evidence of volvulus. No superimposed inflammatory change. 3. 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 This is not fully assessed on this examination. If indicated, this could be better assessed dedicated nonemergent high-resolution CT examination of the chest. 4. Cardiomegaly with biatrial enlargement. 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 If indicated, this would be better assessed with dedicated echocardiography 5. Aortic atherosclerosis. Aortic Atherosclerosis (ICD10-I70.0). Electronically Signed   By: Helyn Numbers M.D.   On: 03/23/2023 20:25    Procedures Procedures    Medications Ordered in ED Medications  sodium phosphate (FLEET) 7-19 GM/118ML enema 1 enema (has no administration in time range)  sodium chloride 0.9 % bolus 1,000 mL (1,000 mLs Intravenous New Bag/Given 03/16/2023 1903)  iohexol (OMNIPAQUE) 300 MG/ML solution 100 mL (100 mLs Intravenous Contrast Given 03/14/2023 1956)    ED Course/ Medical Decision Making/ A&P                                 Medical Decision Making Brett Cominsky. is a 82 y.o. male here presenting with abdominal distention.  Patient has abdominal distention that is likely chronic.  X-ray showed ileus versus distended stomach.  Plan to get CBC and CMP and CT abdomen pelvis.  8:49 PM I reviewed patient labs and independently interpreted CT scan.  Patient has right ureteral stent with hydro-.  Patient also has large volume of stool.  Patient also has ileus versus early large bowel obstruction from the stool impaction.  I performed rectal exam patient has soft stool at the rectal vault.  Patient has no obvious hard stool impaction.  I ordered enema.  Patient will be admitted for stool impaction and possible ileus versus large bowel obstruction from it.  Patient will likely need aggressive bowel regimen.  Problems Addressed: Fecal impaction [facility]): acute illness or injury [facility]): acute illness or injury  Amount  and/or Complexity of Data Reviewed Labs: ordered. Decision-making details documented in ED Course. Radiology: ordered and independent interpretation performed. Decision-making details documented in ED Course.  Risk OTC drugs. Prescription drug management. Decision regarding hospitalization.    Final Clinical Impression(s) / ED Diagnoses Final diagnoses:  Fecal impaction (HCC)  Ileus [facility])    Rx / DC Orders ED Discharge Orders     None         Charlynne Pander, MD 03/24/2023 2052

## 2023-03-08 NOTE — H&P (Signed)
History and Physical   [facility] [place] [facility] Admission History and Physical [employer], D.O.    Patient Name: Brett Pearson MR#: [id number] Date of Birth: 07/17/1941 Date of Admission: 03/15/2023  Referring MD/NP/PA: Dr. Silverio Lay Primary Care Physician: [name], Elige Radon, MD  Chief Complaint:  Chief Complaint  Patient presents with   Weakness  Please note the entire history is obtained from the patient's emergency department chart, emergency department provider and the patient's family who is at the bedside. Patient's personal history is limited by dementia.   HPI: Brett Pearson is a 82 y.o. male from nursing home with a known history of dementia, afib, cirrhosis, BKA, vascular insufficiency presents to the emergency department for evaluation of abdominal distention.  Patient was recently diagnosed with and treated for pneumonia, went for a followup XRay which showed resolved pneumonia but some gaseous distention.  Wife requested that he be sent to the emergency department for further evaluation.  She states that he does not typically complain of pain but she can tell he is uncomfortable as his abdomen has been progressively more distended.  She also reports that when ever he eats or drinks he coughs and chokes to the point of being red in the face.  Therefore he has had significantly diminished p.o. intake.  He does have normal urine output  At baseline patient only ambulates with a wheelchair, he does have a prosthesis for his BKA but does not walk.  Per wife he is at his baseline mental status which is pleasantly confused.  He has not had any documented fevers at the nursing home or here in the emergency department but on my visit with him he is complaining of chills.  He denies pain  EMS/ED Course: Patient received Fleet enema. Medical admission has been requested for further management of fecal impaction, ileus, possible large bowel obstruction.  On CT he was also found  to have an obstructing 5 mm mid ureteral right stone as well as a nonobstructing 4 mm stone and a UA which is positive for leuk esterase and nitrates.   Review of Systems:  Unable to obtain secondary to history of dementia.  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 His smoking use included cigarettes. He has never used smokeless tobacco. He reports that he does not drink alcohol and does not use drugs.  Allergies  Allergen Reactions   Amoxicillin     Other reaction(s): HIVES    Family History  Problem Relation Age of Onset   Stroke Mother    Parkinson's disease Father     Prior to Admission medications   Medication Sig Start Date End Date Taking? Authorizing Provider  Ascorbic Acid (VITAMIN C) 1000 MG tablet Take 1,000 mg by mouth daily.    [provider]  cephALEXin (KEFLEX) 500 MG capsule Take 1  History and Physical   [facility] [place] [facility] Admission History and Physical [employer], D.O.    Patient Name: Brett Pearson MR#: [id number] Date of Birth: 07/17/1941 Date of Admission: 03/15/2023  Referring MD/NP/PA: Dr. Silverio Lay Primary Care Physician: [name], Elige Radon, MD  Chief Complaint:  Chief Complaint  Patient presents with   Weakness  Please note the entire history is obtained from the patient's emergency department chart, emergency department provider and the patient's family who is at the bedside. Patient's personal history is limited by dementia.   HPI: Brett Pearson is a 82 y.o. male from nursing home with a known history of dementia, afib, cirrhosis, BKA, vascular insufficiency presents to the emergency department for evaluation of abdominal distention.  Patient was recently diagnosed with and treated for pneumonia, went for a followup XRay which showed resolved pneumonia but some gaseous distention.  Wife requested that he be sent to the emergency department for further evaluation.  She states that he does not typically complain of pain but she can tell he is uncomfortable as his abdomen has been progressively more distended.  She also reports that when ever he eats or drinks he coughs and chokes to the point of being red in the face.  Therefore he has had significantly diminished p.o. intake.  He does have normal urine output  At baseline patient only ambulates with a wheelchair, he does have a prosthesis for his BKA but does not walk.  Per wife he is at his baseline mental status which is pleasantly confused.  He has not had any documented fevers at the nursing home or here in the emergency department but on my visit with him he is complaining of chills.  He denies pain  EMS/ED Course: Patient received Fleet enema. Medical admission has been requested for further management of fecal impaction, ileus, possible large bowel obstruction.  On CT he was also found  to have an obstructing 5 mm mid ureteral right stone as well as a nonobstructing 4 mm stone and a UA which is positive for leuk esterase and nitrates.   Review of Systems:  Unable to obtain secondary to history of dementia.  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 His smoking use included cigarettes. He has never used smokeless tobacco. He reports that he does not drink alcohol and does not use drugs.  Allergies  Allergen Reactions   Amoxicillin     Other reaction(s): HIVES    Family History  Problem Relation Age of Onset   Stroke Mother    Parkinson's disease Father     Prior to Admission medications   Medication Sig Start Date End Date Taking? Authorizing Provider  Ascorbic Acid (VITAMIN C) 1000 MG tablet Take 1,000 mg by mouth daily.    [provider]  cephALEXin (KEFLEX) 500 MG capsule Take 1  History and Physical   [facility] [place] [facility] Admission History and Physical [employer], D.O.    Patient Name: Brett Pearson MR#: [id number] Date of Birth: 07/17/1941 Date of Admission: 03/15/2023  Referring MD/NP/PA: Dr. Silverio Lay Primary Care Physician: [name], Elige Radon, MD  Chief Complaint:  Chief Complaint  Patient presents with   Weakness  Please note the entire history is obtained from the patient's emergency department chart, emergency department provider and the patient's family who is at the bedside. Patient's personal history is limited by dementia.   HPI: Brett Pearson is a 82 y.o. male from nursing home with a known history of dementia, afib, cirrhosis, BKA, vascular insufficiency presents to the emergency department for evaluation of abdominal distention.  Patient was recently diagnosed with and treated for pneumonia, went for a followup XRay which showed resolved pneumonia but some gaseous distention.  Wife requested that he be sent to the emergency department for further evaluation.  She states that he does not typically complain of pain but she can tell he is uncomfortable as his abdomen has been progressively more distended.  She also reports that when ever he eats or drinks he coughs and chokes to the point of being red in the face.  Therefore he has had significantly diminished p.o. intake.  He does have normal urine output  At baseline patient only ambulates with a wheelchair, he does have a prosthesis for his BKA but does not walk.  Per wife he is at his baseline mental status which is pleasantly confused.  He has not had any documented fevers at the nursing home or here in the emergency department but on my visit with him he is complaining of chills.  He denies pain  EMS/ED Course: Patient received Fleet enema. Medical admission has been requested for further management of fecal impaction, ileus, possible large bowel obstruction.  On CT he was also found  to have an obstructing 5 mm mid ureteral right stone as well as a nonobstructing 4 mm stone and a UA which is positive for leuk esterase and nitrates.   Review of Systems:  Unable to obtain secondary to history of dementia.  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 His smoking use included cigarettes. He has never used smokeless tobacco. He reports that he does not drink alcohol and does not use drugs.  Allergies  Allergen Reactions   Amoxicillin     Other reaction(s): HIVES    Family History  Problem Relation Age of Onset   Stroke Mother    Parkinson's disease Father     Prior to Admission medications   Medication Sig Start Date End Date Taking? Authorizing Provider  Ascorbic Acid (VITAMIN C) 1000 MG tablet Take 1,000 mg by mouth daily.    [provider]  cephALEXin (KEFLEX) 500 MG capsule Take 1  capsule (500 mg total) by mouth 4 (four) times daily. 04/23/22   Prosperi, Christian H, PA-C  cholecalciferol (VITAMIN D) 1000 UNITS tablet Take 2,000 Units by mouth daily.    [provider]  Cyanocobalamin (VITAMIN B-12 PO) Take by mouth daily.    [provider]  donepezil (ARICEPT) 10 MG tablet Take 1 tablet (10 mg total) by mouth at bedtime. Patient not taking: No sig reported 04/18/21   [name], Elige Radon, MD  Magnesium Gluconate 500 (27 Mg) MG TABS Take 500 mg by mouth.    [provider]  melatonin 3 MG TABS tablet Take 3 tablets (9 mg total) by  mouth at bedtime. 06/22/21   Catarina Hartshorn, MD  metoprolol succinate (TOPROL-XL) 25 MG 24 hr tablet Take 1 tablet (25 mg total) by mouth daily. 05/31/21   [name], Elige Radon, MD  Multiple Vitamin (MULTIVITAMIN) tablet Take 1 tablet by mouth daily.    [provider]  warfarin (COUMADIN) 2 MG tablet 6 mg on every day except Mondays and Fridays, take 4 mg on Monday and Friday 05/31/21   [name], Elige Radon, MD    Physical Exam: Vitals:   03/22/2023 1803 03/17/2023 1900  BP: 115/68 109/63  Pulse: 78 74  Resp: 19 18  Temp: 98.6 F (37 C)   TempSrc: Oral   SpO2: 98% 99%  Weight: 86 kg   Height: 6\' 2"  (1.88 m)     GENERAL: 82 y.o.-year-old white male patient, pleasantly fused HEENT: Head atraumatic, normocephalic. Pupils equal. Mucus membranes  NECK: Supple. No JVD. CHEST: Normal breath sounds bilaterally. No wheezing, rales, rhonchi or crackles. No use of accessory muscles of respiration.  No reproducible chest wall tenderness.  CARDIOVASCULAR: Irregular S1, S2 normal.  ABDOMEN: Significantly distended, generalized tenderness.  No CVA tenderness.  No rebound, guarding, rigidity. Normoactive bowel sounds present in all four quadrants.  EXTREMITIES: R BKA no pedal edema, cyanosis, or clubbing. No calf tenderness or Homan's sign.  NEUROLOGIC: The patient is alert and oriented x 2.  Is able to comply with exam and follow commandsSKIN: Warm, dry, and intact without obvious rash, lesion, or ulcer.    Labs on Admission:  CBC: Recent Labs  Lab 03/29/2023 1841  WBC 10.1  NEUTROABS 6.7  HGB 11.8*  HCT 36.0*  MCV 103.2*  PLT 248   Basic Metabolic Panel: Recent Labs  Lab 03/16/2023 1841  NA 138  K 3.8  CL 102  CO2 30  GLUCOSE 105*  BUN 19  CREATININE 0.85  CALCIUM 8.9   GFR: Estimated Creatinine Clearance: 77.9 mL/min (by C-G formula based on SCr of 0.85 mg/dL). Liver Function Tests: Recent Labs  Lab 03/27/2023 1841  AST 18  ALT 11  ALKPHOS 62  BILITOT 0.6  PROT 7.1   ALBUMIN 3.0*   Recent Labs  Lab 03/15/2023 1841  LIPASE 38   No results for input(s): "AMMONIA" in the last 168 hours. Coagulation Profile: No results for input(s): "INR", "PROTIME" in the last 168 hours. Cardiac Enzymes: No results for input(s): "CKTOTAL", "CKMB", "CKMBINDEX", "TROPONINI" in the last 168 hours. BNP (last 3 results) No results for input(s): "PROBNP" in the last 8760 hours. HbA1C: No results for input(s): "HGBA1C" in the last 72 hours. CBG: No results for input(s): "GLUCAP" in the last 168 hours. Lipid Profile: No results for input(s): "CHOL", "HDL", "LDLCALC", "TRIG", "CHOLHDL", "LDLDIRECT" in the last 72 hours. Thyroid Function Tests: No results for input(s): "TSH", "T4TOTAL", "FREET4", "T3FREE", "THYROIDAB" in the last 72 hours.

## 2023-03-08 NOTE — ED Notes (Addendum)
Admitting provider at bedside.

## 2023-03-08 NOTE — ED Triage Notes (Addendum)
Patient BIB [facility] from [facility]. Had a chest xray today that showed distended bowel loop and obstruction. Facility wanted patient sent out for follow up on ileus/blockage. Recently had pneumonia. Has dementia. Alert to self only.

## 2023-03-09 ENCOUNTER — Observation Stay (HOSPITAL_BASED_OUTPATIENT_CLINIC_OR_DEPARTMENT_OTHER): Payer: PPO | Admitting: Anesthesiology

## 2023-03-09 ENCOUNTER — Observation Stay (HOSPITAL_COMMUNITY): Payer: PPO | Admitting: Anesthesiology

## 2023-03-09 ENCOUNTER — Observation Stay (HOSPITAL_COMMUNITY): Payer: PPO

## 2023-03-09 ENCOUNTER — Encounter (HOSPITAL_COMMUNITY): Payer: Self-pay | Admitting: Family Medicine

## 2023-03-09 ENCOUNTER — Encounter (HOSPITAL_COMMUNITY): Admission: EM | Disposition: E | Payer: Self-pay | Source: Skilled Nursing Facility | Attending: Internal Medicine

## 2023-03-09 ENCOUNTER — Inpatient Hospital Stay (HOSPITAL_COMMUNITY): Payer: PPO

## 2023-03-09 DIAGNOSIS — R140 Abdominal distension (gaseous): Secondary | ICD-10-CM | POA: Diagnosis present

## 2023-03-09 DIAGNOSIS — J849 Interstitial pulmonary disease, unspecified: Secondary | ICD-10-CM

## 2023-03-09 DIAGNOSIS — Z993 Dependence on wheelchair: Secondary | ICD-10-CM | POA: Diagnosis not present

## 2023-03-09 DIAGNOSIS — R4589 Other symptoms and signs involving emotional state: Secondary | ICD-10-CM | POA: Diagnosis not present

## 2023-03-09 DIAGNOSIS — Z8701 Personal history of pneumonia (recurrent): Secondary | ICD-10-CM | POA: Diagnosis not present

## 2023-03-09 DIAGNOSIS — R0600 Dyspnea, unspecified: Secondary | ICD-10-CM | POA: Diagnosis not present

## 2023-03-09 DIAGNOSIS — N201 Calculus of ureter: Secondary | ICD-10-CM | POA: Diagnosis not present

## 2023-03-09 DIAGNOSIS — R6521 Severe sepsis with septic shock: Secondary | ICD-10-CM | POA: Diagnosis not present

## 2023-03-09 DIAGNOSIS — Z79899 Other long term (current) drug therapy: Secondary | ICD-10-CM | POA: Diagnosis not present

## 2023-03-09 DIAGNOSIS — E860 Dehydration: Secondary | ICD-10-CM | POA: Diagnosis not present

## 2023-03-09 DIAGNOSIS — F0280 Dementia in other diseases classified elsewhere without behavioral disturbance: Secondary | ICD-10-CM | POA: Diagnosis not present

## 2023-03-09 DIAGNOSIS — Z66 Do not resuscitate: Secondary | ICD-10-CM | POA: Diagnosis not present

## 2023-03-09 DIAGNOSIS — N132 Hydronephrosis with renal and ureteral calculous obstruction: Secondary | ICD-10-CM | POA: Diagnosis not present

## 2023-03-09 DIAGNOSIS — K5641 Fecal impaction: Secondary | ICD-10-CM | POA: Diagnosis not present

## 2023-03-09 DIAGNOSIS — G629 Polyneuropathy, unspecified: Secondary | ICD-10-CM | POA: Diagnosis not present

## 2023-03-09 DIAGNOSIS — K6389 Other specified diseases of intestine: Secondary | ICD-10-CM | POA: Diagnosis not present

## 2023-03-09 DIAGNOSIS — F02818 Dementia in other diseases classified elsewhere, unspecified severity, with other behavioral disturbance: Secondary | ICD-10-CM | POA: Diagnosis not present

## 2023-03-09 DIAGNOSIS — I469 Cardiac arrest, cause unspecified: Secondary | ICD-10-CM | POA: Diagnosis not present

## 2023-03-09 DIAGNOSIS — I482 Chronic atrial fibrillation, unspecified: Secondary | ICD-10-CM

## 2023-03-09 DIAGNOSIS — Z89511 Acquired absence of right leg below knee: Secondary | ICD-10-CM

## 2023-03-09 DIAGNOSIS — Z7189 Other specified counseling: Secondary | ICD-10-CM | POA: Diagnosis not present

## 2023-03-09 DIAGNOSIS — G309 Alzheimer's disease, unspecified: Secondary | ICD-10-CM | POA: Diagnosis not present

## 2023-03-09 DIAGNOSIS — D72819 Decreased white blood cell count, unspecified: Secondary | ICD-10-CM | POA: Diagnosis not present

## 2023-03-09 DIAGNOSIS — A419 Sepsis, unspecified organism: Secondary | ICD-10-CM | POA: Diagnosis present

## 2023-03-09 DIAGNOSIS — K7469 Other cirrhosis of liver: Secondary | ICD-10-CM | POA: Diagnosis not present

## 2023-03-09 DIAGNOSIS — Z87891 Personal history of nicotine dependence: Secondary | ICD-10-CM | POA: Diagnosis not present

## 2023-03-09 DIAGNOSIS — Z820 Family history of epilepsy and other diseases of the nervous system: Secondary | ICD-10-CM | POA: Diagnosis not present

## 2023-03-09 DIAGNOSIS — R64 Cachexia: Secondary | ICD-10-CM | POA: Diagnosis not present

## 2023-03-09 DIAGNOSIS — R1310 Dysphagia, unspecified: Secondary | ICD-10-CM | POA: Diagnosis not present

## 2023-03-09 DIAGNOSIS — I10 Essential (primary) hypertension: Secondary | ICD-10-CM | POA: Diagnosis not present

## 2023-03-09 DIAGNOSIS — K567 Ileus, unspecified: Secondary | ICD-10-CM | POA: Diagnosis not present

## 2023-03-09 DIAGNOSIS — R54 Age-related physical debility: Secondary | ICD-10-CM | POA: Diagnosis not present

## 2023-03-09 DIAGNOSIS — Z7901 Long term (current) use of anticoagulants: Secondary | ICD-10-CM | POA: Diagnosis not present

## 2023-03-09 DIAGNOSIS — G308 Other Alzheimer's disease: Secondary | ICD-10-CM

## 2023-03-09 DIAGNOSIS — Z515 Encounter for palliative care: Secondary | ICD-10-CM | POA: Diagnosis not present

## 2023-03-09 DIAGNOSIS — N136 Pyonephrosis: Secondary | ICD-10-CM | POA: Diagnosis not present

## 2023-03-09 HISTORY — PX: CYSTOSCOPY W/ URETERAL STENT PLACEMENT: SHX1429

## 2023-03-09 LAB — COMPREHENSIVE METABOLIC PANEL
ALT: 12 U/L (ref 0–44)
AST: 33 U/L (ref 15–41)
Albumin: 2.5 g/dL — ABNORMAL LOW (ref 3.5–5.0)
Alkaline Phosphatase: 116 U/L (ref 38–126)
Anion gap: 13 (ref 5–15)
BUN: 17 mg/dL (ref 8–23)
CO2: 21 mmol/L — ABNORMAL LOW (ref 22–32)
Calcium: 8.3 mg/dL — ABNORMAL LOW (ref 8.9–10.3)
Chloride: 108 mmol/L (ref 98–111)
Creatinine, Ser: 0.9 mg/dL (ref 0.61–1.24)
GFR, Estimated: >60 mL/min (ref >60)
Glucose, Bld: 78 mg/dL (ref 70–99)
Potassium: 3.4 mmol/L — ABNORMAL LOW (ref 3.5–5.1)
Sodium: 142 mmol/L (ref 135–145)
Total Bilirubin: 1.5 mg/dL — ABNORMAL HIGH (ref 0.3–1.2)
Total Protein: 6.2 g/dL — ABNORMAL LOW (ref 6.5–8.1)

## 2023-03-09 LAB — CBC
HCT: 36.2 % — ABNORMAL LOW (ref 39.0–52.0)
Hemoglobin: 11.4 g/dL — ABNORMAL LOW (ref 13.0–17.0)
MCH: 33.3 pg (ref 26.0–34.0)
MCHC: 31.5 g/dL (ref 30.0–36.0)
MCV: 105.8 fL — ABNORMAL HIGH (ref 80.0–100.0)
Platelets: 181 10*3/uL (ref 150–400)
RBC: 3.42 MIL/uL — ABNORMAL LOW (ref 4.22–5.81)
RDW: 15.8 % — ABNORMAL HIGH (ref 11.5–15.5)
WBC: 3.8 10*3/uL — ABNORMAL LOW (ref 4.0–10.5)
nRBC: 0 % (ref 0.0–0.2)

## 2023-03-09 LAB — MRSA NEXT GEN BY PCR, NASAL: MRSA by PCR Next Gen: NOT DETECTED

## 2023-03-09 SURGERY — CYSTOSCOPY, WITH RETROGRADE PYELOGRAM AND URETERAL STENT INSERTION
Anesthesia: General | Laterality: Right

## 2023-03-09 MED ORDER — SODIUM CHLORIDE 0.9% FLUSH
3.0000 mL | Freq: Two times a day (BID) | INTRAVENOUS | Status: DC
  Administered 2023-03-10 – 2023-03-12 (×3): 3 mL via INTRAVENOUS

## 2023-03-09 MED ORDER — FENTANYL CITRATE PF 50 MCG/ML IJ SOSY: 25.0000 ug | PREFILLED_SYRINGE | INTRAMUSCULAR | Status: DC | PRN

## 2023-03-09 MED ORDER — HYDROMORPHONE HCL-NACL 50-0.9 MG/50ML-% IV SOLN
4.0000 mg/h | INTRAVENOUS | Status: DC
  Administered 2023-03-09: 0.5 mg/h via INTRAVENOUS
  Administered 2023-03-10: 2 mg/h via INTRAVENOUS
  Administered 2023-03-11: 6 mg/h via INTRAVENOUS
  Administered 2023-03-11: 4 mg/h via INTRAVENOUS
  Administered 2023-03-12 (×3): 8 mg/h via INTRAVENOUS
  Filled 2023-03-09 (×12): qty 50

## 2023-03-09 MED ORDER — ONDANSETRON HCL 4 MG/2ML IJ SOLN
INTRAMUSCULAR | Status: DC | PRN
  Administered 2023-03-09: 4 mg via INTRAVENOUS

## 2023-03-09 MED ORDER — PHENYLEPHRINE HCL-NACL 20-0.9 MG/250ML-% IV SOLN
INTRAVENOUS | Status: DC | PRN
Start: 2023-03-09 — End: 2023-03-09
  Administered 2023-03-09: 50 ug/min via INTRAVENOUS

## 2023-03-09 MED ORDER — ENOXAPARIN SODIUM 40 MG/0.4ML IJ SOSY: 40.0000 mg | PREFILLED_SYRINGE | Freq: Every day | INTRAMUSCULAR | Status: DC

## 2023-03-09 MED ORDER — 0.9 % SODIUM CHLORIDE (POUR BTL) OPTIME
TOPICAL | Status: DC | PRN
  Administered 2023-03-09: 1000 mL

## 2023-03-09 MED ORDER — SODIUM CHLORIDE 0.9 % IV BOLUS
500.0000 mL | Freq: Once | INTRAVENOUS | Status: AC
  Administered 2023-03-09: 500 mL via INTRAVENOUS

## 2023-03-09 MED ORDER — FLEET ENEMA 7-19 GM/118ML RE ENEM: 1.0000 | ENEMA | Freq: Once | RECTAL | Status: DC | PRN

## 2023-03-09 MED ORDER — SODIUM CHLORIDE 0.9 % IR SOLN
Status: DC | PRN
  Administered 2023-03-09: 3000 mL

## 2023-03-09 MED ORDER — METOPROLOL SUCCINATE ER 25 MG PO TB24: 25.0000 mg | ORAL_TABLET | Freq: Every day | ORAL | Status: DC

## 2023-03-09 MED ORDER — PROPOFOL 10 MG/ML IV BOLUS
INTRAVENOUS | Status: DC | PRN
Start: 2023-03-09 — End: 2023-03-09
  Administered 2023-03-09: 150 mg via INTRAVENOUS
  Administered 2023-03-09: 30 mg via INTRAVENOUS

## 2023-03-09 MED ORDER — POLYETHYLENE GLYCOL 3350 17 G PO PACK
17.0000 g | PACK | Freq: Every day | ORAL | Status: DC
  Filled 2023-03-09: qty 1

## 2023-03-09 MED ORDER — POTASSIUM CHLORIDE IN NACL 20-0.9 MEQ/L-% IV SOLN
INTRAVENOUS | Status: DC
  Filled 2023-03-09 (×3): qty 1000

## 2023-03-09 MED ORDER — SUCCINYLCHOLINE CHLORIDE 200 MG/10ML IV SOSY
PREFILLED_SYRINGE | INTRAVENOUS | Status: DC | PRN
  Administered 2023-03-09: 100 mg via INTRAVENOUS

## 2023-03-09 MED ORDER — ACETAMINOPHEN 650 MG RE SUPP
650.0000 mg | RECTAL | Status: DC | PRN
  Administered 2023-03-09: 650 mg via RECTAL
  Filled 2023-03-09: qty 1

## 2023-03-09 MED ORDER — SODIUM CHLORIDE 0.9 % IV SOLN: INTRAVENOUS | Status: DC

## 2023-03-09 MED ORDER — SORBITOL 70 % SOLN: 960.0000 mL | TOPICAL_OIL | Freq: Once | ORAL | Status: DC

## 2023-03-09 MED ORDER — MORPHINE SULFATE (PF) 2 MG/ML IV SOLN
1.0000 mg | INTRAVENOUS | Status: DC | PRN
  Administered 2023-03-09: 1 mg via INTRAVENOUS
  Administered 2023-03-09: 2 mg via INTRAVENOUS
  Filled 2023-03-09 (×2): qty 1

## 2023-03-09 MED ORDER — OXYCODONE HCL 5 MG/5ML PO SOLN: 5.0000 mg | Freq: Once | ORAL | Status: DC | PRN

## 2023-03-09 MED ORDER — LACTATED RINGERS IV BOLUS
1000.0000 mL | Freq: Once | INTRAVENOUS | Status: AC
  Administered 2023-03-09: 1000 mL via INTRAVENOUS

## 2023-03-09 MED ORDER — HYDROCODONE-ACETAMINOPHEN 5-325 MG PO TABS: 1.0000 | ORAL_TABLET | ORAL | Status: DC | PRN

## 2023-03-09 MED ORDER — FENTANYL CITRATE (PF) 100 MCG/2ML IJ SOLN
INTRAMUSCULAR | Status: DC | PRN
  Administered 2023-03-09: 100 ug via INTRAVENOUS

## 2023-03-09 MED ORDER — SODIUM CHLORIDE 0.9 % IV BOLUS
1000.0000 mL | Freq: Once | INTRAVENOUS | Status: AC
  Administered 2023-03-09: 1000 mL via INTRAVENOUS

## 2023-03-09 MED ORDER — ONDANSETRON HCL 4 MG/2ML IJ SOLN: 4.0000 mg | Freq: Four times a day (QID) | INTRAMUSCULAR | Status: DC | PRN

## 2023-03-09 MED ORDER — FENTANYL CITRATE (PF) 100 MCG/2ML IJ SOLN
INTRAMUSCULAR | Status: AC
  Filled 2023-03-09: qty 2

## 2023-03-09 MED ORDER — HYDROMORPHONE HCL 1 MG/ML IJ SOLN
INTRAMUSCULAR | Status: AC
  Administered 2023-03-09: 1 mg via INTRAVENOUS
  Filled 2023-03-09: qty 1

## 2023-03-09 MED ORDER — ONDANSETRON HCL 4 MG/2ML IJ SOLN: 4.0000 mg | Freq: Once | INTRAMUSCULAR | Status: DC | PRN

## 2023-03-09 MED ORDER — PROPOFOL 10 MG/ML IV BOLUS
INTRAVENOUS | Status: AC
  Filled 2023-03-09: qty 20

## 2023-03-09 MED ORDER — LIDOCAINE HCL (PF) 2 % IJ SOLN
INTRAMUSCULAR | Status: AC
  Filled 2023-03-09: qty 5

## 2023-03-09 MED ORDER — SENNOSIDES-DOCUSATE SODIUM 8.6-50 MG PO TABS: 1.0000 | ORAL_TABLET | Freq: Every evening | ORAL | Status: DC | PRN

## 2023-03-09 MED ORDER — STERILE WATER FOR IRRIGATION IR SOLN
Status: DC | PRN
  Administered 2023-03-09: 3000 mL

## 2023-03-09 MED ORDER — ORAL CARE MOUTH RINSE
15.0000 mL | OROMUCOSAL | Status: DC
  Administered 2023-03-10 (×2): 15 mL via OROMUCOSAL

## 2023-03-09 MED ORDER — LACTATED RINGERS IV BOLUS (SEPSIS)
1000.0000 mL | Freq: Once | INTRAVENOUS | Status: AC
  Administered 2023-03-09: 1000 mL via INTRAVENOUS

## 2023-03-09 MED ORDER — ACETAMINOPHEN 10 MG/ML IV SOLN: 1000.0000 mg | Freq: Once | INTRAVENOUS | Status: DC | PRN

## 2023-03-09 MED ORDER — SENNOSIDES-DOCUSATE SODIUM 8.6-50 MG PO TABS
1.0000 | ORAL_TABLET | Freq: Every day | ORAL | Status: DC
  Filled 2023-03-09: qty 1

## 2023-03-09 MED ORDER — ONE-DAILY MULTI VITAMINS PO TABS: 1.0000 | ORAL_TABLET | Freq: Every day | ORAL | Status: DC

## 2023-03-09 MED ORDER — MELATONIN 3 MG PO TABS: 9.0000 mg | ORAL_TABLET | Freq: Every day | ORAL | Status: DC

## 2023-03-09 MED ORDER — PHENYLEPHRINE HCL (PRESSORS) 10 MG/ML IV SOLN
INTRAVENOUS | Status: AC
  Filled 2023-03-09: qty 1

## 2023-03-09 MED ORDER — DOCUSATE SODIUM 100 MG PO CAPS: 100.0000 mg | ORAL_CAPSULE | Freq: Two times a day (BID) | ORAL | Status: DC

## 2023-03-09 MED ORDER — HYDROMORPHONE HCL 1 MG/ML IJ SOLN
1.0000 mg | INTRAMUSCULAR | Status: DC | PRN
  Administered 2023-03-10 (×9): 1 mg via INTRAVENOUS

## 2023-03-09 MED ORDER — PHENYLEPHRINE 80 MCG/ML (10ML) SYRINGE FOR IV PUSH (FOR BLOOD PRESSURE SUPPORT)
PREFILLED_SYRINGE | INTRAVENOUS | Status: AC
  Filled 2023-03-09: qty 10

## 2023-03-09 MED ORDER — IOHEXOL 300 MG/ML  SOLN
INTRAMUSCULAR | Status: DC | PRN
  Administered 2023-03-09: 5 mL via URETHRAL

## 2023-03-09 MED ORDER — HYDRALAZINE HCL 20 MG/ML IJ SOLN: 10.0000 mg | Freq: Four times a day (QID) | INTRAMUSCULAR | Status: DC | PRN

## 2023-03-09 MED ORDER — EPHEDRINE 5 MG/ML INJ
INTRAVENOUS | Status: AC
  Filled 2023-03-09: qty 5

## 2023-03-09 MED ORDER — OXYCODONE HCL 5 MG PO TABS: 5.0000 mg | ORAL_TABLET | Freq: Once | ORAL | Status: DC | PRN

## 2023-03-09 MED ORDER — EPHEDRINE SULFATE-NACL 50-0.9 MG/10ML-% IV SOSY
PREFILLED_SYRINGE | INTRAVENOUS | Status: DC | PRN
  Administered 2023-03-09: 10 mg via INTRAVENOUS

## 2023-03-09 MED ORDER — VITAMIN C 500 MG PO TABS
500.0000 mg | ORAL_TABLET | Freq: Two times a day (BID) | ORAL | Status: DC
  Administered 2023-03-09: 500 mg via ORAL
  Filled 2023-03-09: qty 1

## 2023-03-09 MED ORDER — VITAMIN D 25 MCG (1000 UNIT) PO TABS
2000.0000 [IU] | ORAL_TABLET | Freq: Every day | ORAL | Status: DC
  Administered 2023-03-09: 2000 [IU] via ORAL
  Filled 2023-03-09: qty 2

## 2023-03-09 MED ORDER — ORAL CARE MOUTH RINSE: 15.0000 mL | OROMUCOSAL | Status: DC | PRN

## 2023-03-09 MED ORDER — HYDROMORPHONE BOLUS VIA INFUSION
0.5000 mg | INTRAVENOUS | Status: DC | PRN
  Administered 2023-03-10 – 2023-03-11 (×2): 0.5 mg via INTRAVENOUS

## 2023-03-09 MED ORDER — LACTATED RINGERS IV SOLN: INTRAVENOUS | Status: DC | PRN

## 2023-03-09 MED ORDER — BISACODYL 5 MG PO TBEC: 5.0000 mg | DELAYED_RELEASE_TABLET | Freq: Every day | ORAL | Status: DC | PRN

## 2023-03-09 MED ORDER — PHENYLEPHRINE 80 MCG/ML (10ML) SYRINGE FOR IV PUSH (FOR BLOOD PRESSURE SUPPORT)
PREFILLED_SYRINGE | INTRAVENOUS | Status: DC | PRN
  Administered 2023-03-09 (×4): 200 ug via INTRAVENOUS

## 2023-03-09 MED ORDER — MAGNESIUM OXIDE -MG SUPPLEMENT 400 (240 MG) MG PO TABS
400.0000 mg | ORAL_TABLET | Freq: Every day | ORAL | Status: DC
  Administered 2023-03-09: 400 mg via ORAL
  Filled 2023-03-09: qty 1

## 2023-03-09 MED ORDER — MORPHINE SULFATE (PF) 2 MG/ML IV SOLN: 1.0000 mg | Freq: Four times a day (QID) | INTRAVENOUS | Status: DC | PRN

## 2023-03-09 MED ORDER — ONDANSETRON HCL 4 MG/2ML IJ SOLN
INTRAMUSCULAR | Status: AC
  Filled 2023-03-09: qty 2

## 2023-03-09 MED ORDER — CHLORHEXIDINE GLUCONATE CLOTH 2 % EX PADS
6.0000 | MEDICATED_PAD | Freq: Every day | CUTANEOUS | Status: DC
  Administered 2023-03-09: 6 via TOPICAL

## 2023-03-09 MED ORDER — ONDANSETRON HCL 4 MG PO TABS: 4.0000 mg | ORAL_TABLET | Freq: Four times a day (QID) | ORAL | Status: DC | PRN

## 2023-03-09 MED ORDER — LIDOCAINE 2% (20 MG/ML) 5 ML SYRINGE
INTRAMUSCULAR | Status: DC | PRN
  Administered 2023-03-09: 60 mg via INTRAVENOUS

## 2023-03-09 SURGICAL SUPPLY — 22 items
BAG URO CATCHER STRL LF (MISCELLANEOUS) ×1 IMPLANT
CATH FOLEY 2W COUNCIL 20FR 5CC (CATHETERS) IMPLANT
CATH TIEMANN FOLEY 18FR 5CC (CATHETERS) IMPLANT
CATH URETL OPEN END 6FR 70 (CATHETERS) IMPLANT
CLOTH BEACON ORANGE TIMEOUT ST (SAFETY) ×1 IMPLANT
GLOVE BIOGEL PI IND STRL 7.5 (GLOVE) IMPLANT
GLOVE ECLIPSE 7.5 STRL STRAW (GLOVE) IMPLANT
GLOVE SURG LX STRL 7.5 STRW (GLOVE) ×1 IMPLANT
GOWN STRL REUS W/ TWL XL LVL3 (GOWN DISPOSABLE) ×1 IMPLANT
GOWN STRL REUS W/TWL 2XL LVL3 (GOWN DISPOSABLE) IMPLANT
GOWN STRL REUS W/TWL XL LVL3 (GOWN DISPOSABLE) ×1
GUIDEWIRE STR DUAL SENSOR (WIRE) ×1 IMPLANT
GUIDEWIRE ZIPWRE .038 STRAIGHT (WIRE) IMPLANT
KIT TURNOVER KIT A (KITS) IMPLANT
MANIFOLD NEPTUNE II (INSTRUMENTS) ×1 IMPLANT
PACK CYSTO (CUSTOM PROCEDURE TRAY) ×1 IMPLANT
STENT URET 6FRX26 CONTOUR (STENTS) IMPLANT
SYR 30ML LL (SYRINGE) IMPLANT
SYR TOOMEY IRRIG 70ML (MISCELLANEOUS) ×2
SYRINGE TOOMEY IRRIG 70ML (MISCELLANEOUS) IMPLANT
TUBING CONNECTING 10 (TUBING) ×1 IMPLANT
TUBING UROLOGY SET (TUBING) IMPLANT

## 2023-03-09 NOTE — Anesthesia Procedure Notes (Signed)
Procedure Name: Intubation Date/Time: 03/08/2023 10:11 AM  Performed by: Florene Route, CRNAPre-anesthesia Checklist: Patient identified, Emergency Drugs available, Suction available and Patient being monitored Patient Re-evaluated:Patient Re-evaluated prior to induction Oxygen Delivery Method: Circle system utilized Preoxygenation: Pre-oxygenation with 100% oxygen Induction Type: IV induction and Rapid sequence Laryngoscope Size: Miller and 3 Grade View: Grade I Tube type: Oral Tube size: 8.0 mm Number of attempts: 1 Airway Equipment and Method: Stylet and Oral airway Placement Confirmation: ETT inserted through vocal cords under direct vision, positive ETCO2 and breath sounds checked- equal and bilateral Secured at: 22 cm Tube secured with: Tape Dental Injury: Teeth and Oropharynx as per pre-operative assessment

## 2023-03-09 NOTE — ED Notes (Signed)
Writer Media planner, MD notifying of low BP and inquiring whether she'd like any medications/fluids given. Awaiting response.

## 2023-03-09 NOTE — Progress Notes (Signed)
Progress Note   Patient: Brett Pearson. WGN:[id number] DOB: 30-May-1941 DOA: 03/22/2023     0 DOS: the patient was seen and examined on 03/27/2023 at 9:15AM and 1:38AM      Brief hospital course: Mr. Dougal is an 82 y.o. M with dementia, lives in SNF, AF on Eliquis, R BKA and compensated cirrhosis who presented from SNF for 1 week decreased responsiveness, poor PO intake.    Significant events: 8/2: Admitted on antibiotics; CT showed fecal impaction/obstruction and obstructing right hydronephrosis 8/3: Taken to OR for right ureteral stent, transferred to stepdown after    Significant studies: 8/2: CT abdomen and pelvis with contrast -- fecal impaction and large bowel obstruction; obstructing right hydronephrosis; pericardial changes; NSIP of bilateral lungs   Significant microbiology data: 8/2: Urine culture -- pending    Procedures: 8/3: Cystoscopy and right ureteral stent placement by Dr. Margo Aye    Consults: Urology      Assessment and Plan: * Sepsis due to UTI due to impacted ureteral stone Patient has fever, tachycardia and leukopenia from his infection, as well as new hypotension <90 systolic. - Give IV fluids - See previous note --> no escalation of care, defer lactic acid and blood cultures given that  - Continue cefepime - Follow culture data    Ureterolithiasis Impacted right ureteral stone, s/p stent by Dr. Margo Aye on 8/3. - Consult Urology, appreciate cares  Fecal impaction and large bowel obstruction Received enema in the ER, had bowel movement.  Unable to tolerate MiraLAX/senna at this time due to worsening mentation.  Defer NG, repeat enema given GOC.   - Morphine   Interstitial lung disease (HCC) No active disease  Hx of right BKA (HCC)    Cryptogenic cirrhosis (HCC)    Alzheimer's dementia (HCC) Per partner Enid Derry, at baseline is bed bound.  Can speak a few words only, which are unintelligible.  Is total care. - Hold donepezil, mirtazapine until  able to take PO  Chronic atrial fibrillation (HCC) Not on metoprolol any more - Hold Eliquis for now given hematuria          Subjective: Patient is nonverbal.  Nursing note that he is poorly responsive, has gross hematuria, appears to be grimacing in pain.  No further bowel movement since this morning.     Physical Exam: BP (!) 70/40 (BP Location: Right Arm)   Pulse 92   Temp (!) 102.3 F (39.1 C) (Axillary)   Resp (!) 28   Ht 6\' 2"  (1.88 m)   Wt 86 kg   SpO2 93%   BMI 24.34 kg/m   Elderly adult male, appears sluggish and lethargic, mostly keeps his eyes closed, groans one-word answers. Tachycardic, irregular, I do not appreciate murmurs or peripheral edema Respiratory rate seems shallow, lung sounds with fine crackles bilaterally Abdomen seems slightly distended, no rigidity or rebound, mild tenderness throughout Attention diminished, does not follow commands, tone in the upper extremities bilaterally appears normal    Data Reviewed: CT abdomen discussed above CBC shows no leukocytosis Comprehensive metabolic panel unremarkable Urinalysis with pyuria  Family Communication: POA Carol by phone    Disposition: Status is: Inpatient The patient was admitted for bowel obstruction from fecal impaction as well as ureteral stone  He had cystoscopy and stent placement today, and appears to be decompensating afterwards  I discussed goals of care at length with his POA Okey Regal, who was clear, consistent with his advance directive on file, not to escalate care beyond fluids and antibiotics.  I suspect that he will deteriorate overnight, and that we will have to transition tonight to comfort care with IV morphine        Author: Alberteen Sam, MD 03/29/2023 4:41 PM  For on call review [email or web address].

## 2023-03-09 NOTE — Op Note (Signed)
OPERATIVE NOTE   Patient Name: Brett Pearson.  MRN: [id number]   Date of Procedure: 03/08/2023   Preoperative diagnosis:  Right obstructive ureteral stone and UTI/urosepsis  Postoperative diagnosis:  same  Procedure:  Cystoscopy, right RPG, right ureteral stent placement, clott evacuation  Attending: Concepcion Living, MD  Anesthesia: general  Estimated blood loss:  Fluids: Per anesthesia record  Drains: right dj stent and council tip catheter  Specimens: none  Antibiotics: maxipime  Findings: right mid ureteral stone with proximal hydro.  After stent placement I placed a foley and there was noted to be fair amount of clott and poor drainage of catheter.  I looked back in with the cysto and saw a large clott surrounding the stent.  Clott evacuated and no significant active bleeding  Indications:  UTI and obstructive ureteral stone  Description of Procedure:  Patient was brought to the operating room where he was correctly identified.  He underwent satisfactory induction of general anesthesia and was then positioned in a modified dorsolithotomy position taking into account his right lower extremity amputation.  He was carefully padded and secured.  His external genitalia were then prepped and draped in usual sterile fashion.  Preprocedural timeout performed.  Cystoscopy was subsequently performed with a 23 [place] panendoscope.  The anterior urethra appeared to be normal.  The prostatic urethra revealed some lateral lobe enlargement and a high bladder neck.  The bladder was entered and carefully inspected.  There appeared to be some inflammatory changes but no active bleeding or evidence of tumor.  The right ureteral orifice was somewhat laterally located.  The left ureteral orifice appeared normal.  Right retrograde ureteropyelogram was subsequently performed with an open-ended catheter.  This revealed a subtle filling defect in the mid ureter consistent with stone  and proximal hydro-.  A sensor wire was then placed for access with a good coil obtained within the renal collecting system.  Over the wire I was unable to place under endoscopic and fluoroscopic control a 6 x 26 cm double-J stent with tether removed.  Good position was obtained proximally and distally.  There was no obvious pus or purulent drainage but somewhat of a hydronephrotic drip was appreciated.  The patient tolerated the procedure well.  Following the procedure I placed a urinary catheter for drainage and noted that there was significant clot in the bladder and the catheter did not drain well.  I looked back in with the cystoscope and saw a significant clot right around the stent and the right ureteral orifice.  Clot evacuation was performed.  After the clot was evacuated there did not appear to be any significant ongoing active bleeding.  A 20 [place] council tip Foley was then placed without difficulty with clear drainage and the procedure was terminated.  The patient tolerated the procedure well.  There were no apparent complications.  Complications: None  Condition: Stable, extubated, transferred to PACU  Plan: definitive stone mgmt after treatment of infection

## 2023-03-09 NOTE — Assessment & Plan Note (Signed)
Per partner Enid Derry, at baseline is bed bound.  Can speak a few words only, which are unintelligible.  Is total care. - Hold donepezil, mirtazapine until able to take PO

## 2023-03-09 NOTE — Assessment & Plan Note (Addendum)
Patient has fever, tachycardia and leukopenia from his infection, as well as new hypotension <90 systolic. - Give IV fluids - See previous note --> no escalation of care, defer lactic acid and blood cultures given that  - Continue cefepime - Follow culture data

## 2023-03-09 NOTE — Consult Note (Signed)
Urology Consult   Physician requesting consult: Dr. Emmit Pomfret  Reason for consult: Obstructing right ureteral stone and UTI/urosepsis  History of Present Illness: Brett Lakey. is a 82 y.o. nursing home resident with multiple medical issues including dementia, A-fib, BKA, cirrhosis who presented to the emergency department today with abdominal distention.  Patient was recently treated for pneumonia and follow-up x-ray demonstrated gaseous distention.  He was subsequently sent to emergency room for further evaluation.  On evaluation in the emergency room the patient was noted to be afebrile but was having chills.  Urinalysis is consistent with UTI.  Patient underwent imaging with CT which showed a 5 mm stone within the mid right ureter right at the pelvic inlet with mild right hydro.  Patient also was noted to have large volume stool suggestive of rectal impaction.  CBC revealed a hemoglobin of 11.8 and normal white count.  He also has normal renal function.  In the emergency department he has received several fluid boluses and has been started on broad-spectrum antibiotics.  He has had borderline low blood pressure.  He denies a history of voiding or storage urinary symptoms, hematuria, UTIs, STDs, urolithiasis, GU malignancy/trauma/surgery.  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  Allergies:  Allergies  Allergen Reactions   Amoxicillin     Other reaction(s): HIVES    Family History  Problem Relation Age of Onset   Stroke Mother    Parkinson's disease Father     Social History:  reports that he quit smoking about 63 years ago. His smoking use included cigarettes. He has never used smokeless tobacco. He reports that he does not drink alcohol and does not use drugs.  ROS: A complete review of systems was performed.  All systems are negative except for pertinent findings as noted.  Physical Exam:  Vital signs in last 24 hours: Temp:  [97.6 F (36.4 C)-98.6 F (37 C)] 98.1 F (36.7 C) (08/03 0800) Pulse Rate:  [74-112] 74 (08/03 0800) Resp:  [16-19] 18 (08/03 0800) BP: (62-159)/(37-139) 94/54 (08/03 0800) SpO2:  [91 %-100 %] 96 % (08/03 0800) Weight:  [86 kg] 86 kg (08/02 1803) Constitutional:  Alert / prominent dementia COR:  RR LUNGS: CTA ABD:  Nl BS.  Distended  Laboratory Data:  Recent Labs    03/22/2023 1841  WBC 10.1  HGB 11.8*  HCT 36.0*  PLT 248    Recent Labs    03/21/2023 1841  NA 138  K 3.8  CL 102  GLUCOSE 105*  BUN 19  CALCIUM 8.9  CREATININE 0.85  Results for orders placed or performed during the hospital encounter of 04/05/2023 (from the past 24 hour(s))  CBC with Differential     Status: Abnormal   Collection Time: 03/17/2023  6:41 PM  Result Value Ref Range   WBC 10.1 4.0 - 10.5 K/uL   RBC 3.49 (L) 4.22 - 5.81 MIL/uL   Hemoglobin 11.8 (L) 13.0 - 17.0 g/dL   HCT 16.1 (L) 09.6 - 04.5 %   MCV 103.2 (H) 80.0 - 100.0 fL   MCH 33.8 26.0 - 34.0 pg   MCHC 32.8 30.0 - 36.0 g/dL   RDW 40.9 81.1 - 91.4 %   Platelets 248 150 - 400 K/uL   nRBC 0.0 0.0 - 0.2 %   Neutrophils Relative % 68 %   Neutro Abs 6.7 1.7 - 7.7 K/uL   Lymphocytes Relative 20 %   Lymphs Abs 2.0 0.7 - 4.0 K/uL   Monocytes Relative 8 %   Monocytes  Absolute 0.8 0.1 - 1.0 K/uL   Eosinophils Relative 4 %   Eosinophils Absolute 0.4 0.0 - 0.5 K/uL   Basophils Relative 0 %   Basophils Absolute 0.0 0.0 - 0.1 K/uL   Immature Granulocytes 0 %   Abs Immature Granulocytes 0.03 0.00 - 0.07 K/uL  Comprehensive metabolic panel     Status: Abnormal   Collection Time: 03/22/2023  6:41 PM  Result Value Ref Range   Sodium 138 135 - 145 mmol/L   Potassium 3.8 3.5 - 5.1 mmol/L   Chloride 102 98 - 111 mmol/L   CO2 30 22 - 32 mmol/L   Glucose, Bld 105 (H) 70 - 99 mg/dL   BUN 19 8 - 23 mg/dL   Creatinine, Ser 7.82 0.61 - 1.24 mg/dL   Calcium 8.9 8.9 - 95.6 mg/dL   Total Protein 7.1 6.5 - 8.1 g/dL   Albumin 3.0 (L) 3.5 - 5.0 g/dL   AST 18 15 - 41 U/L   ALT 11 0 - 44 U/L   Alkaline Phosphatase 62 38 - 126 U/L   Total Bilirubin 0.6 0.3 - 1.2 mg/dL   GFR, Estimated >21 >30 mL/min   Anion gap 6 5 - 15  Lipase, blood     Status: None   Collection Time: 03/14/2023  6:41 PM  Result Value Ref Range   Lipase 38 11 - 51 U/L  Urinalysis, w/ Reflex to Culture (Infection Suspected) -Urine, Clean Catch     Status: Abnormal   Collection Time: 03/22/2023 10:08 PM  Result Value Ref Range   Specimen Source URINE, CLEAN CATCH    Color, Urine AMBER (A) YELLOW   APPearance CLOUDY (A) CLEAR   Specific Gravity, Urine 1.021 1.005 - 1.030   pH 5.0 5.0 - 8.0   Glucose, UA NEGATIVE NEGATIVE mg/dL   Hgb urine dipstick LARGE (A) NEGATIVE   Bilirubin Urine NEGATIVE NEGATIVE   Ketones, ur NEGATIVE NEGATIVE mg/dL   Protein, ur 30 (A) NEGATIVE mg/dL   Nitrite POSITIVE (A) NEGATIVE   Leukocytes,Ua LARGE (A) NEGATIVE   RBC / HPF >50 0 - 5 RBC/hpf   WBC, UA >50 0 - 5 WBC/hpf   Bacteria, UA MANY (A) NONE SEEN   Squamous Epithelial / HPF 0-5 0 - 5 /HPF   WBC Clumps PRESENT    Mucus PRESENT    Budding Yeast PRESENT   Protime-INR     Status: Abnormal   Collection Time: 03/16/2023  7:50 AM  Result Value Ref Range   Prothrombin Time 19.0 (H)  11.4 - 15.2 seconds   INR 1.6 (H)  0.8 - 1.2   No results found for this or any previous visit (from the past 240 hour(s)).  Renal Function: Recent Labs    03/23/2023 1841  CREATININE 0.85   Estimated Creatinine Clearance: 77.9 mL/min (by C-G formula based on SCr of 0.85 mg/dL).  Radiologic Imaging: CT ABDOMEN PELVIS W CONTRAST  Result Date: 03/28/2023 CLINICAL DATA:  Acute nonlocalized abdominal pain, bowel obstruction EXAM: CT ABDOMEN AND PELVIS WITH CONTRAST TECHNIQUE: Multidetector CT imaging of the abdomen and pelvis was performed using the standard protocol following bolus administration of intravenous contrast. RADIATION DOSE REDUCTION: This exam was performed according to the departmental dose-optimization program which includes automated exposure control, adjustment of the mA and/or kV according to patient size and/or use of iterative reconstruction technique. CONTRAST:  OMNIPAQUE IOHEXOL 300 MG/ML  SOLN COMPARISON:  None Available. 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 This is not fully assessed on this examination. No pleural effusion. Cardiomegaly again noted with biatrial enlargement. Calcification of the aortic valve leaflets. 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 Hepatobiliary: No focal liver abnormality is seen. No gallstones, gallbladder wall thickening, or biliary dilatation. Pancreas: Unremarkable. No pancreatic ductal dilatation or surrounding inflammatory changes. Spleen: Normal in size without focal abnormality. Adrenals/Urinary Tract: The adrenal glands are unremarkable. The kidneys are normal in size and position. Mild right hydronephrosis is present with an obstructing 5 mm calculus seen  within the mid right ureter at the pelvic inlet (axial image # 77/11). Superimposed 4 mm nonobstructing calculus within the interpolar right kidney. No nephro or urolithiasis on the left. No hydronephrosis on the left. Simple cortical cyst noted within the lower pole the right kidney for which no follow-up imaging is recommended. The bladder is unremarkable. Stomach/Bowel: Large volume stool is seen within the rectal vault suggesting changes of fecal impaction there is progressive gaseous distension of the more proximal mid and distal colon which may reflect changes of a superimposed colonic ileus or resultant large bowel obstruction. The sigmoid colon is redundant and measures up to 10 cm in diameter. No evidence of volvulus, however. No superimposed inflammatory change. No free intraperitoneal gas or fluid. No pneumatosis. Stomach, small bowel, and appendix are normal. Vascular/Lymphatic: Aortic atherosclerosis. No enlarged abdominal or pelvic lymph nodes. Reproductive: Prostate is unremarkable. Other: No abdominal wall hernia Musculoskeletal: Status post right total hip arthroplasty. Osseous structures are diffusely osteopenic. Diffuse advanced degenerative changes are seen throughout the visualized thoracolumbar spine with probable degenerative ankylosis of L1-L3 vertebral bodies. No acute bone abnormality. IMPRESSION: 1. Obstructing 5 mm calculus within the mid right ureter at the pelvic inlet with associated mild right hydronephrosis. Superimposed 4 mm nonobstructing calculus within the interpolar right kidney. 2. Large volume stool within the rectal vault suggesting changes of fecal impaction. Progressive gaseous distension of the more proximal mid and distal colon which may reflect changes of a superimposed colonic ileus or resultant large bowel obstruction. No evidence of volvulus. No superimposed inflammatory change. 3. 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 This is not fully assessed on this examination. If indicated, this could be better assessed dedicated nonemergent high-resolution CT examination of the chest. 4. Cardiomegaly with biatrial enlargement. 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 If indicated, this would be better assessed with dedicated echocardiography 5. Aortic atherosclerosis. Aortic Atherosclerosis (ICD10-I70.0). Electronically Signed   By: Helyn Numbers M.D.   On: 03/20/2023 20:25    I independently reviewed the above imaging studies.  Impression/Recommendation Obstructing 5 mm right mid ureteral calculus with UTI and suspected impending urosepsis. This morning I called along with the patient's nurse and discussed the clinical situation with his wife Vernell Leep and recommended proceeding with cystoscopy, right retrograde and placement of a right ureteral stent.  Rationale for procedure including potential adverse events and complications reviewed.  Patient's wife provided verbal consent.  Joline Maxcy 03/16/2023, 8:41 AM

## 2023-03-09 NOTE — Progress Notes (Signed)
Patient vital signs were not stable upon arrival to floor from PACU. Pt had a red MEWS and MD was notified. New orders were placed. Patient continued to decline and was eventually transferred to ICU.

## 2023-03-09 NOTE — Anesthesia Preprocedure Evaluation (Signed)
Anesthesia Evaluation  Patient identified by MRN, date of birth, ID band Patient confused    Airway Mallampati: I       Dental  (+) Edentulous Upper, Lower Dentures   Pulmonary former smoker   Pulmonary exam normal        Cardiovascular + dysrhythmias Atrial Fibrillation  Rhythm:Irregular Rate:Normal     Neuro/Psych  PSYCHIATRIC DISORDERS     Dementia  Neuromuscular disease    GI/Hepatic ,GERD  Medicated,,  Endo/Other    Renal/GU      Musculoskeletal   Abdominal Normal abdominal exam  (+)   Peds  Hematology  (+) Blood dyscrasia, anemia   Anesthesia Other Findings  IMPRESSIONS     1. Left ventricular ejection fraction, by estimation, is 60 to 65%. The  left ventricle has normal function. Left ventricular endocardial border  not optimally defined to evaluate regional wall motion. There is mild left  ventricular hypertrophy. Left  ventricular diastolic parameters are indeterminate in the setting of  atrial fibrillation.   2. Right ventricular systolic function is low normal. The right  ventricular size is mildly enlarged. Tricuspid regurgitation signal is  inadequate for assessing PA pressure.   3. Left atrial size was severely dilated.   4. Right atrial size was severely dilated.   5. The mitral valve is abnormal, mildly calcified and with mild prolapse.  Mild mitral valve regurgitation.   6. The aortic valve has an indeterminant number of cusps, probably  tricuspid with moderate calcification and reduced cusp excursion. Aortic  valve regurgitation is mild. Valve gradients do not suggest stenosis.   7. The inferior vena cava is normal in size with <50% respiratory  variability, suggesting right atrial pressure of 8 mmHg.     Reproductive/Obstetrics                             Anesthesia Physical Anesthesia Plan  ASA: 3  Anesthesia Plan: General   Post-op Pain Management:     Induction: Intravenous, Rapid sequence and Cricoid pressure planned  PONV Risk Score and Plan: 3 and Ondansetron, Dexamethasone and Treatment may vary due to age or medical condition  Airway Management Planned: Oral ETT  Additional Equipment: None  Intra-op Plan:   Post-operative Plan: Extubation in OR  Informed Consent: I have reviewed the patients History and Physical, chart, labs and discussed the procedure including the risks, benefits and alternatives for the proposed anesthesia with the patient or authorized representative who has indicated his/her understanding and acceptance.     Dental advisory given  Plan Discussed with: CRNA  Anesthesia Plan Comments:        Anesthesia Quick Evaluation

## 2023-03-09 NOTE — Progress Notes (Signed)
SLP Cancellation Note  Patient Details Name: Brett Pearson. MRN: [id number] DOB: August 26, 1940   Cancelled treatment:       Reason Eval/Treat Not Completed: Medical issues which prohibited therapy;Patient's level of consciousness. Per RN pt very groggy out of surgery. Feels a different day would be best to check on swallowing function. Will continue to follow for readiness for clinical swallow evaluation   Ardyth Gal. MA, CCC-SLP Acute Rehabilitation Services   03/14/2023, 1:13 PM

## 2023-03-09 NOTE — Transfer of Care (Signed)
Immediate Anesthesia Transfer of Care Note  Patient: Brett Pearson.  Procedure(s) Performed: CYSTOSCOPY WITH RETROGRADE PYELOGRAM/URETERAL STENT PLACEMENT (Right)  Patient Location: PACU  Anesthesia Type:General  Level of Consciousness: drowsy  Airway & Oxygen Therapy: Patient Spontanous Breathing and Patient connected to face mask oxygen  Post-op Assessment: Report given to RN and Post -op Vital signs reviewed and stable  Post vital signs: Reviewed and stable  Last Vitals:  Vitals Value Taken Time  BP 136/81 04/03/2023 1118  Temp    Pulse 107 03/17/2023 1120  Resp 27 03/30/2023 1120  SpO2 91 % 04/01/2023 1120  Vitals shown include unfiled device data.  Last Pain:  Vitals:   03/31/2023 0955  TempSrc:   PainSc: 0-No pain         Complications: No notable events documented.

## 2023-03-09 NOTE — Assessment & Plan Note (Signed)
Impacted right ureteral stone, s/p stent by Dr. Margo Aye on 8/3. - Consult Urology, appreciate cares

## 2023-03-09 NOTE — ED Notes (Signed)
Anthoney Harada, NP came down to evaluate patient following page that was sent to floor coverage. New fluid bolus ordered.

## 2023-03-09 NOTE — ED Notes (Signed)
Spoke with Brett Pearson from OR and they want the patient brought to PACU for his kidney stone procedure.

## 2023-03-09 NOTE — Care Plan (Signed)
Spoke with Brett Pearson at bedside, she expressed wish to transition to full comfort care.

## 2023-03-09 NOTE — Assessment & Plan Note (Signed)
Received enema in the ER, had bowel movement.  Unable to tolerate MiraLAX/senna at this time due to worsening mentation.  Defer NG, repeat enema given GOC.   - Morphine

## 2023-03-09 NOTE — IPAL (Signed)
  Interdisciplinary Goals of Care Family Meeting   Date carried out: 03/25/2023  Location of the meeting: Phone conference  Member's involved: Physician and Family Member or next of kin (significant other and POA Airline pilot)  Engineer, agricultural or acting medical decision maker: [place] Faster    Discussion: We discussed goals of care for [employer].. I shared diagnosis of UTI sepsis from impacted stone, fecal impaction and bowel obstruction in the setting of advanced dementia.  Shared current treatment plan of IV antibiotics, fluids, ureteral stenting (already placed) and bowel regimen.  Enid Derry shared that Rosanne Ashing had had a good life, would not wish to continue indefinitely in a fully dependent state, with advanced dementia.  He would not wish for escalation of care to intubation, pressors, artificial nutrition, blood transfusions, surgery.    In light of that, and in light of his current unstable state, I shared that we would continue current cares and respect his wishes not to escalate.  I shared that his prognosis in the short term was poor, his fatality rate high and Carole expressed understanding and wished for him to be provided all comfort measures possible.  Code status:   Code Status: DNR   Disposition: Continue current acute care but no escalation.  Add medicine for comfort.  Time spent for the meeting: 30 minutes    Alberteen Sam, MD  04/01/2023, 4:35 PM

## 2023-03-09 NOTE — Assessment & Plan Note (Addendum)
Not on metoprolol any more - Hold Eliquis for now given hematuria

## 2023-03-09 NOTE — ED Notes (Signed)
Urologist Dr. Margo Aye came in to see patient  for stent procedure. Pt is unable to give consent and so we called wife Marshell Levan who gave consent over the phone after urologist had explained procedure to her. Consent is in patient's room and needs to be taken with him when he gets a room

## 2023-03-09 NOTE — Hospital Course (Signed)
Brett Pearson is an 82 y.o. M with dementia, lives in SNF, AF on Eliquis, R BKA and compensated cirrhosis who presented from SNF for 1 week decreased responsiveness, poor PO intake.    Significant events: 8/2: Admitted on antibiotics; CT showed fecal impaction/obstruction and obstructing right hydronephrosis 8/3: Taken to OR for right ureteral stent, transferred to stepdown after    Significant studies: 8/2: CT abdomen and pelvis with contrast -- fecal impaction and large bowel obstruction; obstructing right hydronephrosis; pericardial changes; NSIP of bilateral lungs   Significant microbiology data: 8/2: Urine culture -- pending    Procedures: 8/3: Cystoscopy and right ureteral stent placement by Dr. Margo Aye    Consults: Urology

## 2023-03-09 NOTE — Anesthesia Postprocedure Evaluation (Signed)
Anesthesia Post Note  Patient: Algis Lehenbauer.  Procedure(s) Performed: CYSTOSCOPY WITH RETROGRADE PYELOGRAM/URETERAL STENT PLACEMENT (Right)     Patient location during evaluation: PACU Anesthesia Type: General Level of consciousness: awake and sedated Pain management: pain level controlled Vital Signs Assessment: post-procedure vital signs reviewed and stable Respiratory status: spontaneous breathing Cardiovascular status: stable Postop Assessment: no apparent nausea or vomiting Anesthetic complications: no  No notable events documented.  Last Vitals:  Vitals:   04/02/2023 1325 03/16/2023 1333  BP: (!) 157/137 (!) 121/100  Pulse: (!) 112 (!) 105  Resp:  20  Temp: (!) 39 C (!) 39 C  SpO2: (!) 86% 92%    Last Pain:  Vitals:    1333  TempSrc: Axillary  PainSc:                  Caren Macadam

## 2023-03-09 NOTE — Assessment & Plan Note (Signed)
No active disease 

## 2023-03-10 ENCOUNTER — Encounter (HOSPITAL_COMMUNITY): Payer: Self-pay | Admitting: Urology

## 2023-03-10 DIAGNOSIS — R0600 Dyspnea, unspecified: Secondary | ICD-10-CM

## 2023-03-10 DIAGNOSIS — Z515 Encounter for palliative care: Secondary | ICD-10-CM | POA: Diagnosis not present

## 2023-03-10 DIAGNOSIS — G308 Other Alzheimer's disease: Secondary | ICD-10-CM | POA: Diagnosis not present

## 2023-03-10 DIAGNOSIS — R6521 Severe sepsis with septic shock: Secondary | ICD-10-CM | POA: Diagnosis not present

## 2023-03-10 DIAGNOSIS — K7469 Other cirrhosis of liver: Secondary | ICD-10-CM | POA: Diagnosis not present

## 2023-03-10 DIAGNOSIS — A419 Sepsis, unspecified organism: Secondary | ICD-10-CM | POA: Diagnosis not present

## 2023-03-10 DIAGNOSIS — Z7189 Other specified counseling: Secondary | ICD-10-CM

## 2023-03-10 DIAGNOSIS — Z79899 Other long term (current) drug therapy: Secondary | ICD-10-CM

## 2023-03-10 DIAGNOSIS — R4589 Other symptoms and signs involving emotional state: Secondary | ICD-10-CM

## 2023-03-10 DIAGNOSIS — Z66 Do not resuscitate: Secondary | ICD-10-CM

## 2023-03-10 MED ORDER — SODIUM CHLORIDE 0.9 % IV SOLN: INTRAVENOUS | Status: DC | PRN

## 2023-03-10 MED ORDER — GLYCOPYRROLATE 0.2 MG/ML IJ SOLN: 0.2000 mg | INTRAMUSCULAR | Status: DC | PRN

## 2023-03-10 NOTE — Plan of Care (Signed)
03/10/2023 Note: due to patient's status (dementia, decreased LOC), interventions geared toward significant other.  Problem: Education: Goal: Knowledge of General Education information will improve Description: Including pain rating scale, medication(s)/side effects and non-pharmacologic comfort measures Outcome: Progressing   Problem: Health Behavior/Discharge Planning: Goal: Ability to manage health-related needs will improve Outcome: Progressing   Problem: Coping: Goal: Level of anxiety will decrease Outcome: Progressing   Problem: Pain Managment: Goal: General experience of comfort will improve Outcome: Progressing   Problem: Safety: Goal: Ability to remain free from injury will improve Outcome: Progressing   Problem: Skin Integrity: Goal: Risk for impaired skin integrity will decrease Outcome: Progressing   Problem: Education: Goal: Knowledge of the prescribed therapeutic regimen will improve Outcome: Progressing   Problem: Coping: Goal: Ability to identify and develop effective coping behavior will improve Outcome: Progressing   Problem: Clinical Measurements: Goal: Quality of life will improve Outcome: Progressing   Problem: Respiratory: Goal: Verbalizations of increased ease of respirations will increase Outcome: Progressing   Problem: Role Relationship: Goal: Family's ability to cope with current situation will improve Outcome: Progressing Goal: Ability to verbalize concerns, feelings, and thoughts to partner or family member will improve Outcome: Progressing   Problem: Pain Management: Goal: Satisfaction with pain management regimen will improve Outcome: Progressing   Brett Pearson BSN, RN, [employer], CCRN 03/10/2023 10:23 PM

## 2023-03-10 NOTE — Progress Notes (Signed)
SLP Cancellation Note  Patient Details Name: Brett Pearson. MRN: [id number] DOB: 1941/07/09   Cancelled treatment:       Reason Eval/Treat Not Completed: Other (comment). Pt now treated for comfort only. Will sign off.    , Riley Nearing 03/10/2023, 9:57 AM

## 2023-03-10 NOTE — Plan of Care (Signed)
03/10/2023 Note: due to patient's status (dementia, decreased LOC), interventions geared toward significant other.  Problem: Education: Goal: Knowledge of General Education information will improve Description: Including pain rating scale, medication(s)/side effects and non-pharmacologic comfort measures Outcome: Progressing   Problem: Health Behavior/Discharge Planning: Goal: Ability to manage health-related needs will improve Outcome: Progressing   Problem: Coping: Goal: Level of anxiety will decrease Outcome: Progressing   Problem: Pain Managment: Goal: General experience of comfort will improve Outcome: Progressing   Problem: Safety: Goal: Ability to remain free from injury will improve Outcome: Progressing   Problem: Skin Integrity: Goal: Risk for impaired skin integrity will decrease Outcome: Progressing   Problem: Education: Goal: Knowledge of the prescribed therapeutic regimen will improve Outcome: Progressing   Problem: Coping: Goal: Ability to identify and develop effective coping behavior will improve Outcome: Progressing   Problem: Clinical Measurements: Goal: Quality of life will improve Outcome: Progressing   Problem: Respiratory: Goal: Verbalizations of increased ease of respirations will increase Outcome: Progressing   Problem: Role Relationship: Goal: Family's ability to cope with current situation will improve Outcome: Progressing   Problem: Pain Management: Goal: Satisfaction with pain management regimen will improve Outcome: Progressing   Cindy S. Clelia Croft BSN, RN, CCRP, CCRN 03/10/2023 2:42 AM

## 2023-03-10 NOTE — Consult Note (Signed)
Consultation Note Date: 03/10/2023   Patient Name: Brett Pearson.  DOB: 12/05/1940  MRN: [id number]  Age / Sex: 82 y.o., male   PCP: [name], Elige Radon, MD Referring Physician: Darlin Drop, DO  Reason for Consultation: Establishing goals of care and symptom management     Chief Complaint/History of Present Illness:   Patient is an 82 year old male with a past medical history of dementia receiving care in long-term facility, A-fib on Eliquis, cirrhosis, and right BKA who was admitted on 03/31/2023 for management of decreased responsiveness and poor p.o. intake.  Upon admission, imaging showed fecal impaction and right hydronephrosis.  Patient underwent cystoscopy with right ureteral stent placement.  Patient's medical status continued to deteriorate in setting of sepsis secondary to urinary tract infection.  Patient's family elected to transition to comfort focused care on 03/08/2023.  Palliative medicine team consulted to assist with complex medical decision making and symptom management.  Reviewed EMR prior to presenting to bedside.  Patient currently on IV Dilaudid continuous infusion at 0.5 mg/h.  Patient has required additional IV 1 mg boluses x 3 doses recently.  Discussed care with bedside RN for updates.  Presented to bedside to check on patient at this time.  Patient unresponsive laying in bed.  Patient did not show nonverbal signs of pain such as grimacing though noted to have increased work of breathing with accessory muscle use.  Discussed care with patient's significant other at bedside.  Noted plan to continue comfort focused care at this time including making adjustments to PCA to assist with work of breathing.  Discussed patient too unstable based on blood pressure to transfer and so inpatient hospital death was anticipated at this time.  Emotional support provided via active listening.  All questions answered at that time.  Noted palliative medicine team will continue to follow with  patient's medical journey.  Discussed care with IDT after visit.  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    Venous insufficiency    Social History   Socioeconomic History   Marital status: Married    Spouse name: Enid Derry   Number of children: 2   Years of education: 16   Highest education level: Master's degree (e.g., MA, MS, MEng, MEd, MSW, MBA)  Occupational History   Occupation: Retired  Tobacco Use   Smoking status: Former    Current packs/day: 0.00    Types: Cigarettes    Quit date: 06/15/1959    Years since quitting: 63.7   Smokeless tobacco: Never  Vaping Use   Vaping status: Never Used  Substance and Sexual Activity   Alcohol use: No    Alcohol/week: 0.0 standard drinks of alcohol   Drug use: No   Sexual activity: Not Currently  Other Topics Concern   Not on file  Social History Narrative   Lives with wife.    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     Is having increased confusion, memory loss - wife's health is also declining -  she is looking to find some assistance with his ADLs       Social Determinants of Health   Financial Resource Strain: Low Risk  (01/25/2021)   Overall Financial Resource Strain (CARDIA)    Difficulty of Paying Living Expenses: Not hard at all  Food Insecurity: No Food Insecurity (03/26/2023)   Hunger Vital Sign    Worried About Running Out of Food in the Last Year: Never true    Ran Out of Food in the Last Year: Never true  Transportation Needs: No Transportation Needs (03/13/2023)   PRAPARE  - Administrator, Civil Service (Medical): No    Lack of Transportation (Non-Medical): No  Physical Activity: Insufficiently Active (08/18/2021)   Exercise Vital Sign    Days of Exercise per Week: 2 days    Minutes of Exercise per Session: 10 min  Stress: Stress Concern Present (08/18/2021)   [employer] of Occupational Health - Occupational Stress Questionnaire    Feeling of Stress : To some extent  Social Connections: Moderately Integrated (01/25/2021)   Social Connection and Isolation Panel [NHANES]    Frequency of Communication with Friends and Family: Once a week    Frequency of Social Gatherings with Friends and Family: Once a week    Attends Religious Services: 1 to 4 times per year    Active Member of [employer] or Organizations: Yes    Attends Banker Meetings: 1 to 4 times per year    Marital Status: Married   Family History  Problem Relation Age of Onset   Stroke Mother    Parkinson's disease Father    Scheduled Meds:  Chlorhexidine Gluconate Cloth  6 each Topical Daily   sodium chloride flush  3 mL Intravenous Q12H   Continuous Infusions:  HYDROmorphone 0.5 mg/hr (03/10/23 1200)   PRN Meds:.acetaminophen, HYDROmorphone, HYDROmorphone (DILAUDID) injection, ondansetron **OR** ondansetron (ZOFRAN) IV, mouth rinse Allergies  Allergen Reactions   Amoxicillin     Other reaction(s): HIVES   CBC:    Component Value Date/Time   WBC 3.8 (L) 03/27/2023 1332   HGB 11.4 (L) 03/11/2023 1332   HGB 12.4 (L) 04/07/2021 1435   HCT 36.2 (L) 04/03/2023 1332   HCT 36.5 (L) 04/07/2021 1435   PLT 181 03/21/2023 1332   PLT 173 04/07/2021 1435   MCV 105.8 (H) 03/23/2023 1332   MCV 99 (H) 04/07/2021 1435   NEUTROABS 6.7 03/20/2023 1841   NEUTROABS 4.8 04/07/2021 1435   LYMPHSABS 2.0 03/18/2023 1841   LYMPHSABS 1.7 04/07/2021 1435   MONOABS 0.8 03/15/2023 1841   EOSABS 0.4 03/11/2023 1841   EOSABS 0.4 04/07/2021 1435   BASOSABS 0.0 03/10/2023 1841    BASOSABS 0.0 04/07/2021 1435   Comprehensive Metabolic Panel:    Component Value Date/Time   NA 142 04/05/2023 1332   NA 142 04/07/2021 1435   K 3.4 (L) 03/31/2023 1332   CL 108 03/29/2023 1332   CO2 21 (L) 03/21/2023 1332   BUN 17 04/02/2023 1332   BUN 15 04/07/2021 1435   CREATININE 0.90 04/05/2023 1332   GLUCOSE 78 03/25/2023 1332   CALCIUM 8.3 (L) 03/16/2023 1332   AST 33 03/15/2023 1332   ALT 12 03/08/2023 1332   ALKPHOS 116 03/24/2023 1332   BILITOT 1.5 (H) 03/07/2023 1332   BILITOT 0.4 04/07/2021 1435   PROT 6.2 (L)  1332   PROT 6.7 04/07/2021 1435   ALBUMIN 2.5 (L) 04/01/2023 1332   ALBUMIN 3.5 (L) 04/07/2021 1435  Physical Exam: Vital Signs: BP (!) 57/40   Pulse 94   Temp (!) 97.4 F (36.3 C) (Axillary)   Resp (!) 8   Ht 6\' 2"  (1.88 m)   Wt 86 kg   SpO2 97%   BMI 24.34 kg/m  SpO2: SpO2: 97 % O2 Device: O2 Device: Nasal Cannula O2 Flow Rate: O2 Flow Rate (L/min): 2 L/min Intake/output summary:  Intake/Output Summary (Last 24 hours) at 03/10/2023 1229 Last data filed at 03/10/2023 1200 Gross per 24 hour  Intake 2367.53 ml  Output 1200 ml  Net 1167.53 ml   LBM:   Baseline Weight: Weight: 86 kg Most recent weight: Weight: 86 kg  General: unresponsive, pale, chronically ill-appearing, cachectic, frail Eyes: No drainage noted  HENT: Dry mucous membranes Cardiovascular: RRR Respiratory: Increased increased work of breathing noted with accessory muscle use Abdomen: not distended Neuro: Unresponsive          Palliative Performance Scale: 10%              Additional Data Reviewed: Recent Labs    03/22/2023 1841 03/22/2023 1332  WBC 10.1 3.8*  HGB 11.8* 11.4*  PLT 248 181  NA 138 142  BUN 19 17  CREATININE 0.85 0.90    Imaging: DG Abd 1 View CLINICAL DATA:  Abdominal distension, fever  EXAM: ABDOMEN - 1 VIEW  COMPARISON:  CT done on 03/29/2023  FINDINGS: There is moderate to marked gaseous distention of bowel loops in upper  abdomen, possibly suggesting ileus or distal colonic obstruction. Fecal impaction in the rectum seen in previous CT is difficult to visualize. Radiograph of pelvis is technically less than optimal obtained in oblique position limiting evaluation of rectum. There is a catheter in the pelvis which may be in the urinary bladder or rectum. There is interval placement of right nephroureteral stent. Right ureteral calculus seen in the previous CT could not be distinctly identified. This may suggest interval passage or obscuration by adjacent to bony structures. Marked degenerative changes are noted in lumbar spine.  IMPRESSION: There is gaseous distention of bowel loops in upper abdomen suggesting ileus or distal colonic obstruction. Interval placement of right nephroureteral stent.  Electronically Signed   By: Ernie Avena M.D.   On: 04/03/2023 16:46 DG C-Arm 1-60 Min-No Report Fluoroscopy was utilized by the requesting physician.  No radiographic  interpretation.     I personally reviewed recent imaging.   Palliative Care Assessment and Plan Summary of Established Goals of Care and Medical Treatment Preferences   Patient is an 82 year old male with a past medical history of dementia receiving care in long-term facility, A-fib on Eliquis, cirrhosis, and right BKA who was admitted on 03/28/2023 for management of decreased responsiveness and poor p.o. intake.  Upon admission, imaging showed fecal impaction and right hydronephrosis.  Patient underwent cystoscopy with right ureteral stent placement.  Patient's medical status continued to deteriorate in setting of sepsis secondary to urinary tract infection.  Patient's family elected to transition to comfort focused care on 03/23/2023.  Palliative medicine team consulted to assist with complex medical decision making and symptom management.  # Complex medical decision making/goals of care  -Patient unable to participate in medical decision  making secondary to his mental status.  -Spoke with patient's significant other at bedside. Patient was transitioned to comfort focused care on 03/08/2023. Discussed comfort focused care with significant other at bedside . Discussed anticipation of hospital death based on examination today.   -Reviewed ACP documentation on file.   -  At this time we will discontinue interventions that are no longer focused on comfort such as IV fluids, imaging, or lab work.  Will instead focus on symptom management of pain, dyspnea, and agitation in the setting of end-of-life care.  -  Code Status: DNR  Prognosis: Hours - Days   # Symptom management    -Pain/Dyspnea, acute in the setting of end-of-life care                               -Increase IV Dilaudid continuous infusion to 1-2 mg/h.  Has IV Dilaudid 1 mg bolus via infusion every 15 minutes as needed.  Continue to adjust based on patient's symptom burden.                   -Secretions, in the setting of end-of-life care                               -Start IV glycopyrrolate 0.2 mg every 4 hours as needed.  # Psycho-social/Spiritual Support:  - Support System: significant other   # Discharge Planning:  Anticipated Hospital Death  -Upon examination today, patient does not appear stable for transfer to inpatient hospice unit. In hospital death anticipated. Should patient stabilize, could then consider discussions regarding inpatient hospice evaluation.   Thank you for allowing the palliative care team to participate in the care Marguerita Beards.Alvester Morin, DO Palliative Care Provider PMT # [phone]  If patient remains symptomatic despite maximum doses, please call PMT at [phone] between 0700 and 1900. Outside of these hours, please call attending, as PMT does not have night coverage.  *Please note that this is a verbal dictation therefore any spelling or grammatical errors are due to the "Dragon Medical One" system interpretation.

## 2023-03-10 NOTE — Progress Notes (Signed)
The patient is too unstable to travel per palliative care medicine, anticipate hospital death.

## 2023-03-10 NOTE — Progress Notes (Signed)
Transition of Care [facility]) - Inpatient Brief Assessment   Patient Details  Name: Brett Pearson. MRN: [id number] Date of Birth: 1940-11-08  Transition of Care [facility]) CM/SW Contact:    Adrian Prows, RN Phone Number: 03/10/2023, 2:21 PM   Clinical Narrative: Pt from [facility]; spoke w/ S.O [place] Faster and grand daughter in room;  transitioned to comfort care anticipating hospital death; no TOC needs.   Transition of Care Asessment: Insurance and Status: Insurance coverage has been reviewed Patient has primary care physician: Yes Home environment has been reviewed: from [facility] Prior level of function:: dependent Prior/Current Home Services: No current home services Social Determinants of Health Reivew: SDOH reviewed no interventions necessary Readmission risk has been reviewed: Yes Transition of care needs: no transition of care needs at this time

## 2023-03-10 NOTE — Progress Notes (Signed)
Urology Inpatient Progress Note  Subjective: POD #1 s/p right dj stent for 4-5 mm right mid ureteral stone and urosepsis. Patient developed hemodynamically instability post-op and transferred to ICU. Patient's wife and granddaughter in room with him.   Anti-infectives: Anti-infectives (From admission, onward)    Start     Dose/Rate Route Frequency Ordered Stop   03/26/2023 2359  ceFEPIme (MAXIPIME) 2 g in sodium chloride 0.9 % 100 mL IVPB  Status:  Discontinued        2 g 200 mL/hr over 30 Minutes Intravenous Every 8 hours 03/16/2023 2312 04/05/2023 2321       Current Facility-Administered Medications  Medication Dose Route Frequency Provider Last Rate Last Admin   0.9 % NaCl with KCl 20 mEq/ L  infusion   Intravenous Continuous Danford, Earl Lites, MD 100 mL/hr at 03/10/23 0600 Infusion Verify at 03/10/23 0600   acetaminophen (TYLENOL) suppository 650 mg  650 mg Rectal Q4H PRN Alberteen Sam, MD   650 mg at 03/10/2023 1641   Chlorhexidine Gluconate Cloth 2 % PADS 6 each  6 each Topical Daily Alberteen Sam, MD   6 each at 03/25/2023 1641   hydrALAZINE (APRESOLINE) injection 10 mg  10 mg Intravenous Q6H PRN Hugelmeyer, Alexis, DO       HYDROmorphone (DILAUDID) 50 mg in 50 mL NS (1mg /mL) premix infusion  0.5 mg/hr Intravenous Continuous Danford, Earl Lites, MD 0.5 mL/hr at 03/10/23 0600 0.5 mg/hr at 03/10/23 0600   HYDROmorphone (DILAUDID) bolus via infusion 0.5 mg  0.5 mg Intravenous Q30 min PRN Alberteen Sam, MD       HYDROmorphone (DILAUDID) injection 1 mg  1 mg Intravenous Q15 min PRN Alberteen Sam, MD   1 mg at 03/10/23 0737   ondansetron (ZOFRAN) tablet 4 mg  4 mg Oral Q6H PRN Hugelmeyer, Alexis, DO       Or   ondansetron (ZOFRAN) injection 4 mg  4 mg Intravenous Q6H PRN Hugelmeyer, Alexis, DO       Oral care mouth rinse  15 mL Mouth Rinse 4 times per day Alberteen Sam, MD       Oral care mouth rinse  15 mL Mouth Rinse PRN Danford,  Earl Lites, MD       sodium chloride flush (NS) 0.9 % injection 3 mL  3 mL Intravenous Q12H Hugelmeyer, Alexis, DO         Objective: Vital signs in last 24 hours: Temp:  [97.4 F (36.3 C)-102.8 F (39.3 C)] 97.4 F (36.3 C) (08/04 0745) Pulse Rate:  [43-112] 87 (08/04 0700) Resp:  [7-30] 7 (08/04 0700) BP: (56-157)/(34-137) 57/40 (08/03 1900) SpO2:  [83 %-99 %] 98 % (08/04 0700)  Intake/Output from previous day: 08/03 0701 - 08/04 0700 In: 2569.2 [P.O.:120; I.V.:2269.5; IV Piggyback:179.7] Out: 850 [Urine:800; Blood:50] Intake/Output this shift: No intake/output data recorded.  GENERAL APPEARANCE:  Well appearing, well developed, well nourished, NAD HEENT:  Atraumatic, normocephalic, oropharynx clear NECK:  Supple without lymphadenopathy or thyromegaly ABDOMEN:  Soft, non-tender, no masses EXTREMITIES:  Moves all extremities well, without clubbing, cyanosis, or edema NEUROLOGIC:  Alert and oriented x 3, normal gait, CN II-XII grossly intact MENTAL STATUS:  appropriate BACK:  Non-tender to palpation, No CVAT SKIN:  Warm, dry, and intact   Lab Results:  Recent Labs     1841 03/11/2023 1332  WBC 10.1 3.8*  HGB 11.8* 11.4*  HCT 36.0* 36.2*  PLT 248 181   BMET Recent Labs  04/03/2023 1841 03/31/2023 1332  NA 138 142  K 3.8 3.4*  CL 102 108  CO2 30 21*  GLUCOSE 105* 78  BUN 19 17  CREATININE 0.85 0.90  CALCIUM 8.9 8.3*   PT/INR Recent Labs    03/20/2023 0000 03/18/2023 0750  LABPROT 18.8* 19.0*  INR 1.5* 1.6*   ABG No results for input(s): "PHART", "HCO3" in the last 72 hours.  Invalid input(s): "PCO2", "PO2"  Studies/Results: DG Abd 1 View  Result Date: 03/15/2023 CLINICAL DATA:  Abdominal distension, fever EXAM: ABDOMEN - 1 VIEW COMPARISON:  CT done on 04/04/2023 FINDINGS: There is moderate to marked gaseous distention of bowel loops in upper abdomen, possibly suggesting ileus or distal colonic obstruction. Fecal impaction in the rectum seen in  previous CT is difficult to visualize. Radiograph of pelvis is technically less than optimal obtained in oblique position limiting evaluation of rectum. There is a catheter in the pelvis which may be in the urinary bladder or rectum. There is interval placement of right nephroureteral stent. Right ureteral calculus seen in the previous CT could not be distinctly identified. This may suggest interval passage or obscuration by adjacent to bony structures. Marked degenerative changes are noted in lumbar spine. IMPRESSION: There is gaseous distention of bowel loops in upper abdomen suggesting ileus or distal colonic obstruction. Interval placement of right nephroureteral stent. Electronically Signed   By: Ernie Avena M.D.   On: 04/04/2023 16:46   DG C-Arm 1-60 Min-No Report  Result Date: 03/29/2023 Fluoroscopy was utilized by the requesting physician.  No radiographic interpretation.   CT ABDOMEN PELVIS W CONTRAST  Result Date: 04/05/2023 CLINICAL DATA:  Acute nonlocalized abdominal pain, bowel obstruction EXAM: CT ABDOMEN AND PELVIS WITH CONTRAST TECHNIQUE: Multidetector CT imaging of the abdomen and pelvis was performed using the standard protocol following bolus administration of intravenous contrast. RADIATION DOSE REDUCTION: This exam was performed according to the departmental dose-optimization program which includes automated exposure control, adjustment of the mA and/or kV according to patient size and/or use of iterative reconstruction technique. CONTRAST:  OMNIPAQUE IOHEXOL 300 MG/ML  SOLN COMPARISON:  None Available. 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 This is not fully assessed on this examination. No pleural effusion. Cardiomegaly again noted with biatrial enlargement. Calcification of the aortic  valve leaflets. 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 Hepatobiliary: No focal liver abnormality is seen. No gallstones, gallbladder wall thickening, or biliary dilatation. Pancreas: Unremarkable. No pancreatic ductal dilatation or surrounding inflammatory changes. Spleen: Normal in size without focal abnormality. Adrenals/Urinary Tract: The adrenal glands are unremarkable. The kidneys are normal in size and position. Mild right hydronephrosis is present with an obstructing 5 mm calculus seen within the mid right ureter at the pelvic inlet (axial image # 77/11). Superimposed 4 mm nonobstructing calculus within the interpolar right kidney. No nephro or urolithiasis on the left. No hydronephrosis on the left. Simple cortical cyst noted within the lower pole the right kidney for which no follow-up imaging is recommended. The bladder is unremarkable. Stomach/Bowel: Large volume stool is seen within the rectal vault suggesting changes of fecal impaction there is progressive gaseous distension of the more proximal mid and distal colon which may reflect changes of a superimposed colonic ileus or resultant large bowel obstruction. The sigmoid colon is redundant and measures up to 10  cm in diameter. No evidence of volvulus, however. No superimposed inflammatory change. No free intraperitoneal gas or fluid. No pneumatosis. Stomach, small bowel, and appendix are normal. Vascular/Lymphatic: Aortic atherosclerosis. No enlarged abdominal or pelvic lymph nodes. Reproductive: Prostate is unremarkable. Other: No abdominal wall hernia Musculoskeletal: Status post right total hip arthroplasty. Osseous structures are diffusely osteopenic. Diffuse advanced degenerative changes are seen throughout the visualized thoracolumbar spine with probable degenerative ankylosis of L1-L3 vertebral bodies. No acute bone  abnormality. IMPRESSION: 1. Obstructing 5 mm calculus within the mid right ureter at the pelvic inlet with associated mild right hydronephrosis. Superimposed 4 mm nonobstructing calculus within the interpolar right kidney. 2. Large volume stool within the rectal vault suggesting changes of fecal impaction. Progressive gaseous distension of the more proximal mid and distal colon which may reflect changes of a superimposed colonic ileus or resultant large bowel obstruction. No evidence of volvulus. No superimposed inflammatory change. 3. 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 This is not fully assessed on this examination. If indicated, this could be better assessed dedicated nonemergent high-resolution CT examination of the chest. 4. Cardiomegaly with biatrial enlargement. 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 If indicated, this would be better assessed with dedicated echocardiography 5. Aortic atherosclerosis. Aortic Atherosclerosis (ICD10-I70.0). Electronically Signed   By: Helyn Numbers M.D.   On: 03/13/2023 20:25     Assessment & Plan: POD 1 s/p right ureteral stent for obstructing stone and urosepsis Family has elected comfort measures only at this point   Joline Maxcy, MD 03/10/2023

## 2023-03-10 NOTE — Progress Notes (Signed)
PROGRESS NOTE  Brett Pearson. VHQ:[id number] DOB: November 21, 1940 DOA: 03/28/2023 PCP: [name], Elige Radon, MD  HPI/Recap of past 24 hours:  Brett Pearson is an 82 y.o. M with dementia, lives in SNF, AF on Eliquis, R BKA and compensated cirrhosis who presented from SNF for 1 week decreased responsiveness, poor PO intake   Significant events: 8/2: Admitted on antibiotics; CT showed fecal impaction/obstruction and obstructing right hydronephrosis 8/3: Taken to OR for right ureteral stent, transferred to stepdown after     Significant studies: 8/2: CT abdomen and pelvis with contrast -- fecal impaction and large bowel obstruction; obstructing right hydronephrosis; pericardial changes; NSIP of bilateral lungs     Significant microbiology data: 8/2: Urine culture -- pending     Procedures: 8/3: Cystoscopy and right ureteral stent placement by Dr. Margo Aye     Consults: Urology   Family made decision for comfort care only on 03/24/2023.  Currently on comfort measures orders only.    03/10/23: The patient was seen and examined at his bedside.  His significant other and granddaughter were present in the room.  He appeared comfortable.  No questions or concerns at this point from family members.  Palliative care medicine and TOC have been consulted to assist with disposition.    Assessment/Plan: Principal Problem:   Sepsis due to UTI due to impacted ureteral stone Active Problems:   Chronic atrial fibrillation (HCC)   Alzheimer's dementia (HCC)   Cryptogenic cirrhosis (HCC)   Hx of right BKA (HCC)   Interstitial lung disease (HCC)   Fecal impaction and large bowel obstruction   Ureterolithiasis  Sepsis due to UTI due to impacted ureteral stone Ureterolithiasis Fecal impaction and large bowel obstruction  Interstitial lung disease (HCC)  Hx of right BKA (HCC)  Cryptogenic cirrhosis (HCC)  Alzheimer's dementia (HCC)  Chronic atrial fibrillation (HCC)  Continue comfort care  measures.  Code Status: DNR/comfort care only.  Family Communication: Significant orders and granddaughter at bedside.  Disposition Plan: Likely will discharge within the next 24 to 48 hours.   Consultants: Palliative care medicine TOC.  Procedures: Cystoscopy and right ureteral stent placement on 03/15/2023  Antimicrobials: None.  DVT prophylaxis: Done.  Comfort care only.  Status is: Inpatient     Objective: Vitals:   03/10/23 0700 03/10/23 0745 03/10/23 0800 03/10/23 0900  BP:      Pulse: 87  91 94  Resp: (!) 7  (!) 7 (!) 8  Temp:  (!) 97.4 F (36.3 C)    TempSrc:  Axillary    SpO2: 98%  99% 97%  Weight:      Height:        Intake/Output Summary (Last 24 hours) at 03/10/2023 1114 Last data filed at 03/10/2023 1000 Gross per 24 hour  Intake 2971.77 ml  Output 1250 ml  Net 1721.77 ml   Filed Weights   03/28/2023 1803  Weight: 86 kg    Exam:  General: 82 y.o. year-old male frail-appearing.  Obtunded. Cardiovascular: Regular rate and rhythm with no rubs or gallops.  Respiratory: Shallow breath. Abdomen: Soft hypoactive bowel sounds. Musculoskeletal: No lower extremity edema.    Data Reviewed: CBC: Recent Labs  Lab 04/01/2023 1841 03/29/2023 1332  WBC 10.1 3.8*  NEUTROABS 6.7  --   HGB 11.8* 11.4*  HCT 36.0* 36.2*  MCV 103.2* 105.8*  PLT 248 181   Basic Metabolic Panel: Recent Labs  Lab 04/06/2023 1841 03/07/2023 1332  NA 138 142  K 3.8 3.4*  CL 102  108  CO2 30 21*  GLUCOSE 105* 78  BUN 19 17  CREATININE 0.85 0.90  CALCIUM 8.9 8.3*   GFR: Estimated Creatinine Clearance: 73.6 mL/min (by C-G formula based on SCr of 0.9 mg/dL). Liver Function Tests: Recent Labs  Lab 03/29/2023 1841 03/18/2023 1332  AST 18 33  ALT 11 12  ALKPHOS 62 116  BILITOT 0.6 1.5*  PROT 7.1 6.2*  ALBUMIN 3.0* 2.5*   Recent Labs  Lab 03/11/2023 1841  LIPASE 38   No results for input(s): "AMMONIA" in the last 168 hours. Coagulation Profile: Recent Labs  Lab  03/15/2023 0000 03/17/2023 0750  INR 1.5* 1.6*   Cardiac Enzymes: No results for input(s): "CKTOTAL", "CKMB", "CKMBINDEX", "TROPONINI" in the last 168 hours. BNP (last 3 results) No results for input(s): "PROBNP" in the last 8760 hours. HbA1C: No results for input(s): "HGBA1C" in the last 72 hours. CBG: No results for input(s): "GLUCAP" in the last 168 hours. Lipid Profile: No results for input(s): "CHOL", "HDL", "LDLCALC", "TRIG", "CHOLHDL", "LDLDIRECT" in the last 72 hours. Thyroid Function Tests: No results for input(s): "TSH", "T4TOTAL", "FREET4", "T3FREE", "THYROIDAB" in the last 72 hours. Anemia Panel: No results for input(s): "VITAMINB12", "FOLATE", "FERRITIN", "TIBC", "IRON", "RETICCTPCT" in the last 72 hours. Urine analysis:    Component Value Date/Time   COLORURINE AMBER (A) 04/02/2023 2208   APPEARANCEUR CLOUDY (A) 03/23/2023 2208   APPEARANCEUR Cloudy (A) 05/31/2021 1643   LABSPEC 1.021 03/18/2023 2208   PHURINE 5.0 03/18/2023 2208   GLUCOSEU NEGATIVE 03/27/2023 2208   HGBUR LARGE (A) 03/22/2023 2208   BILIRUBINUR NEGATIVE 03/31/2023 2208   BILIRUBINUR Negative 05/31/2021 1643   KETONESUR NEGATIVE 04/05/2023 2208   PROTEINUR 30 (A) 04/06/2023 2208   NITRITE POSITIVE (A) 04/06/2023 2208   LEUKOCYTESUR LARGE (A) 03/23/2023 2208   Sepsis Labs: @LABRCNTIP (procalcitonin:4,lacticidven:4)  ) Recent Results (from the past 240 hour(s))  Urine Culture     Status: Abnormal (Preliminary result)   Collection Time: 03/07/2023 10:08 PM   Specimen: Urine, Random  Result Value Ref Range Status   Specimen Description   Final    URINE, RANDOM Performed at [facility], [address], [place], [place] [address]    Special Requests   Final    NONE Reflexed from [id number] Performed at [facility], [address] [place], [place] [address]    Culture (A)  Final    >=100,000 COLONIES/mL ESCHERICHIA COLI SUSCEPTIBILITIES TO FOLLOW Performed  at [facility], [address], [place], [place] [address]    Report Status PENDING  Incomplete  MRSA Next Gen by PCR, Nasal     Status: None   Collection Time: 03/22/2023  4:07 PM   Specimen: Nasal Mucosa; Nasal Swab  Result Value Ref Range Status   MRSA by PCR Next Gen NOT DETECTED NOT DETECTED Final    Comment: (NOTE) The GeneXpert MRSA Assay (FDA approved for NASAL specimens only), is one component of a comprehensive MRSA colonization surveillance program. It is not intended to diagnose MRSA infection nor to guide or monitor treatment for MRSA infections. Test performance is not FDA approved in patients less than 53 years old. Performed at [facility], [address], [place], [place] [address]       Studies: DG Abd 1 View  Result Date: 03/10/2023 CLINICAL DATA:  Abdominal distension, fever EXAM: ABDOMEN - 1 VIEW COMPARISON:  CT done on 04/02/2023 FINDINGS: There is moderate to marked gaseous distention of bowel loops  in upper abdomen, possibly suggesting ileus or distal colonic obstruction. Fecal impaction in the rectum seen in previous CT is difficult to visualize. Radiograph of pelvis is technically less than optimal obtained in oblique position limiting evaluation of rectum. There is a catheter in the pelvis which may be in the urinary bladder or rectum. There is interval placement of right nephroureteral stent. Right ureteral calculus seen in the previous CT could not be distinctly identified. This may suggest interval passage or obscuration by adjacent to bony structures. Marked degenerative changes are noted in lumbar spine. IMPRESSION: There is gaseous distention of bowel loops in upper abdomen suggesting ileus or distal colonic obstruction. Interval placement of right nephroureteral stent. Electronically Signed   By: Ernie Avena M.D.   On: 03/14/2023 16:46    Scheduled Meds:  Chlorhexidine Gluconate Cloth  6 each Topical Daily   mouth rinse  15  mL Mouth Rinse 4 times per day   sodium chloride flush  3 mL Intravenous Q12H    Continuous Infusions:  0.9 % NaCl with KCl 20 mEq / L 100 mL/hr at 03/10/23 1000   HYDROmorphone 0.5 mg/hr (03/10/23 1000)     LOS: 1 day     Darlin Drop, MD [facility] Pager [phone]  If 7PM-7AM, please contact night-coverage [email or web address] Password [email or web address] 03/10/2023, 11:14 AM

## 2023-03-11 DIAGNOSIS — Z515 Encounter for palliative care: Secondary | ICD-10-CM | POA: Diagnosis not present

## 2023-03-11 DIAGNOSIS — R6521 Severe sepsis with septic shock: Secondary | ICD-10-CM | POA: Diagnosis not present

## 2023-03-11 DIAGNOSIS — A419 Sepsis, unspecified organism: Secondary | ICD-10-CM | POA: Diagnosis not present

## 2023-03-11 DIAGNOSIS — K7469 Other cirrhosis of liver: Secondary | ICD-10-CM | POA: Diagnosis not present

## 2023-03-11 DIAGNOSIS — G308 Other Alzheimer's disease: Secondary | ICD-10-CM | POA: Diagnosis not present

## 2023-03-11 MED ORDER — POLYVINYL ALCOHOL 1.4 % OP SOLN
1.0000 [drp] | OPHTHALMIC | Status: DC | PRN
  Administered 2023-03-11: 1 [drp] via OPHTHALMIC
  Filled 2023-03-11 (×2): qty 15

## 2023-03-11 MED ORDER — HYDROMORPHONE BOLUS VIA INFUSION: 4.0000 mg | INTRAVENOUS | Status: DC | PRN

## 2023-03-11 MED ORDER — HYDROMORPHONE BOLUS VIA INFUSION
2.0000 mg | INTRAVENOUS | Status: DC | PRN
  Administered 2023-03-11: 4 mg via INTRAVENOUS

## 2023-03-11 MED ORDER — HYDROMORPHONE BOLUS VIA INFUSION
1.0000 mg | INTRAVENOUS | Status: DC | PRN
  Administered 2023-03-11 (×2): 1 mg via INTRAVENOUS

## 2023-03-11 NOTE — Progress Notes (Signed)
PROGRESS NOTE  Brett Pearson. XBJ:[id number] DOB: 1941-05-17 DOA: 03/09/2023 PCP: [name], Elige Radon, MD  HPI/Recap of past 24 hours:  Mr. Brett Pearson is an 82 y.o. M with dementia, lives in SNF, AF on Eliquis, R BKA and compensated cirrhosis who presented from SNF for 1 week decreased responsiveness, poor PO intake   Significant events: 8/2: Admitted on antibiotics; CT showed fecal impaction/obstruction and obstructing right hydronephrosis 8/3: Taken to OR for right ureteral stent, transferred to stepdown after     Significant studies: 8/2: CT abdomen and pelvis with contrast -- fecal impaction and large bowel obstruction; obstructing right hydronephrosis; pericardial changes; NSIP of bilateral lungs     Significant microbiology data: 8/2: Urine culture -- pending     Procedures: 8/3: Cystoscopy and right ureteral stent placement by Dr. Margo Aye     Consults: Urology   Family made decision for comfort care only on 04/04/2023.  Currently on comfort measures orders only.    03/11/23: Comfort care measure orders are in place.  Seen the patient without examining to avoid disturbing the patient, 2 family members are at bedside sleeping.  The patient with shallow breath noted.    Assessment/Plan: Principal Problem:   Sepsis due to UTI due to impacted ureteral stone Active Problems:   Chronic atrial fibrillation (HCC)   Alzheimer's dementia (HCC)   Cryptogenic cirrhosis (HCC)   Hx of right BKA (HCC)   Interstitial lung disease (HCC)   Fecal impaction and large bowel obstruction   Ureterolithiasis   Palliative care encounter   DNR (do not resuscitate)   End of life care   High risk medication use   Dyspnea   Counseling and coordination of care   Goals of care, counseling/discussion   Need for emotional support  Sepsis secondary to UTI due to impacted ureteral stone Ureterolithiasis Fecal impaction and large bowel obstruction  Interstitial lung disease (HCC)  Hx of right BKA  (HCC)  Cryptogenic cirrhosis (HCC)  Alzheimer's dementia (HCC)  Chronic atrial fibrillation (HCC)  Continue comfort care measures.  Appreciate palliative care medicine's assistance.  Code Status: DNR/comfort care only.  Family Communication: Significant other and granddaughter at bedside.  Disposition Plan: Anticipated hospital death.   Consultants: Palliative care medicine Urology TOC.  Procedures: Cystoscopy and right ureteral stent placement on 03/20/2023  Antimicrobials: None.  DVT prophylaxis: Comfort care only.  Status is: Inpatient     Objective: Vitals:   03/11/23 0400 03/11/23 0500 03/11/23 0600 03/11/23 0839  BP:    (!) 88/62  Pulse: 94 97 97 91  Resp: 11 12 11 10   Temp:      TempSrc:      SpO2: (!) 86% (!) 88% (!) 88% (!) 89%  Weight:      Height:        Intake/Output Summary (Last 24 hours) at 03/11/2023 1229 Last data filed at 03/11/2023 0850 Gross per 24 hour  Intake 177.4 ml  Output 300 ml  Net -122.6 ml   Filed Weights   03/30/2023 1803  Weight: 86 kg    Exam:  General: 82 y.o. year-old male frail-appearing.  Unresponsive. Cardiovascular: Regular rate and rhythm with no rubs or gallops.  Respiratory: Shallow breath. Abdomen: Soft hypoactive bowel sounds.   Data Reviewed: CBC: Recent Labs  Lab 03/20/2023 1841 03/11/2023 1332  WBC 10.1 3.8*  NEUTROABS 6.7  --   HGB 11.8* 11.4*  HCT 36.0* 36.2*  MCV 103.2* 105.8*  PLT 248 181   Basic Metabolic Panel:  Recent Labs  Lab 03/23/2023 1841 03/10/2023 1332  NA 138 142  K 3.8 3.4*  CL 102 108  CO2 30 21*  GLUCOSE 105* 78  BUN 19 17  CREATININE 0.85 0.90  CALCIUM 8.9 8.3*   GFR: Estimated Creatinine Clearance: 73.6 mL/min (by C-G formula based on SCr of 0.9 mg/dL). Liver Function Tests: Recent Labs  Lab 04/04/2023 1841 03/19/2023 1332  AST 18 33  ALT 11 12  ALKPHOS 62 116  BILITOT 0.6 1.5*  PROT 7.1 6.2*  ALBUMIN 3.0* 2.5*   Recent Labs  Lab 03/11/2023 1841  LIPASE 38   No  results for input(s): "AMMONIA" in the last 168 hours. Coagulation Profile: Recent Labs  Lab 03/18/2023 0000 03/29/2023 0750  INR 1.5* 1.6*   Cardiac Enzymes: No results for input(s): "CKTOTAL", "CKMB", "CKMBINDEX", "TROPONINI" in the last 168 hours. BNP (last 3 results) No results for input(s): "PROBNP" in the last 8760 hours. HbA1C: No results for input(s): "HGBA1C" in the last 72 hours. CBG: No results for input(s): "GLUCAP" in the last 168 hours. Lipid Profile: No results for input(s): "CHOL", "HDL", "LDLCALC", "TRIG", "CHOLHDL", "LDLDIRECT" in the last 72 hours. Thyroid Function Tests: No results for input(s): "TSH", "T4TOTAL", "FREET4", "T3FREE", "THYROIDAB" in the last 72 hours. Anemia Panel: No results for input(s): "VITAMINB12", "FOLATE", "FERRITIN", "TIBC", "IRON", "RETICCTPCT" in the last 72 hours. Urine analysis:    Component Value Date/Time   COLORURINE AMBER (A) 03/28/2023 2208   APPEARANCEUR CLOUDY (A) 03/29/2023 2208   APPEARANCEUR Cloudy (A) 05/31/2021 1643   LABSPEC 1.021 03/11/2023 2208   PHURINE 5.0 03/11/2023 2208   GLUCOSEU NEGATIVE 03/30/2023 2208   HGBUR LARGE (A) 03/31/2023 2208   BILIRUBINUR NEGATIVE 03/29/2023 2208   BILIRUBINUR Negative 05/31/2021 1643   KETONESUR NEGATIVE 03/23/2023 2208   PROTEINUR 30 (A) 03/18/2023 2208   NITRITE POSITIVE (A) 03/30/2023 2208   LEUKOCYTESUR LARGE (A) 03/28/2023 2208   Sepsis Labs: @LABRCNTIP (procalcitonin:4,lacticidven:4)  ) Recent Results (from the past 240 hour(s))  Urine Culture     Status: Abnormal   Collection Time: 03/07/2023 10:08 PM   Specimen: Urine, Random  Result Value Ref Range Status   Specimen Description   Final    URINE, RANDOM Performed at [facility], [address], [place], [place] [address]    Special Requests   Final    NONE Reflexed from [id number] Performed at [facility], [address], [place], [place] [address]    Culture >=100,000  COLONIES/mL ESCHERICHIA COLI (A)  Final   Report Status 03/11/2023 FINAL  Final   Organism ID, Bacteria ESCHERICHIA COLI (A)  Final      Susceptibility   Escherichia coli - MIC*    AMPICILLIN >=32 RESISTANT Resistant     CEFAZOLIN 16 SENSITIVE Sensitive     CEFEPIME <=0.12 SENSITIVE Sensitive     CEFTRIAXONE 0.5 SENSITIVE Sensitive     CIPROFLOXACIN <=0.25 SENSITIVE Sensitive     GENTAMICIN <=1 SENSITIVE Sensitive     IMIPENEM <=0.25 SENSITIVE Sensitive     NITROFURANTOIN <=16 SENSITIVE Sensitive     TRIMETH/SULFA <=20 SENSITIVE Sensitive     AMPICILLIN/SULBACTAM >=32 RESISTANT Resistant     PIP/TAZO 8 SENSITIVE Sensitive     * >=100,000 COLONIES/mL ESCHERICHIA COLI  MRSA Next Gen by PCR, Nasal     Status: None   Collection Time: 03/17/2023  4:07 PM   Specimen: Nasal Mucosa; Nasal Swab  Result Value Ref Range Status   MRSA by PCR Next Gen  NOT DETECTED NOT DETECTED Final    Comment: (NOTE) The GeneXpert MRSA Assay (FDA approved for NASAL specimens only), is one component of a comprehensive MRSA colonization surveillance program. It is not intended to diagnose MRSA infection nor to guide or monitor treatment for MRSA infections. Test performance is not FDA approved in patients less than 82 years old. Performed at [facility], [address] [place], [place] [address]       Studies: No results found.  Scheduled Meds:  Chlorhexidine Gluconate Cloth  6 each Topical Daily   sodium chloride flush  3 mL Intravenous Q12H    Continuous Infusions:  sodium chloride 10 mL/hr at 03/11/23 0850   HYDROmorphone 4 mg/hr (03/11/23 1010)     LOS: 2 days     Darlin Drop, MD [facility] Pager [phone]  If 7PM-7AM, please contact night-coverage [email or web address] Password [facility] 03/11/2023, 12:29 PM

## 2023-03-11 NOTE — Progress Notes (Cosign Needed Addendum)
Dilaudid drip bag changed. There was 0ml to be wasted. Cosign sent to witnessing RN.

## 2023-03-11 NOTE — Plan of Care (Signed)
Comfort care patient.  Care plan resolved.    Problem: Education: Goal: Knowledge of General Education information will improve Description: Including pain rating scale, medication(s)/side effects and non-pharmacologic comfort measures Outcome: Adequate for Discharge   Problem: Health Behavior/Discharge Planning: Goal: Ability to manage health-related needs will improve Outcome: Adequate for Discharge   Problem: Coping: Goal: Level of anxiety will decrease Outcome: Adequate for Discharge   Problem: Pain Managment: Goal: General experience of comfort will improve Outcome: Adequate for Discharge   Problem: Safety: Goal: Ability to remain free from injury will improve Outcome: Adequate for Discharge   Problem: Skin Integrity: Goal: Risk for impaired skin integrity will decrease Outcome: Adequate for Discharge   Problem: Education: Goal: Knowledge of the prescribed therapeutic regimen will improve Outcome: Adequate for Discharge   Problem: Coping: Goal: Ability to identify and develop effective coping behavior will improve Outcome: Adequate for Discharge   Problem: Clinical Measurements: Goal: Quality of life will improve Outcome: Adequate for Discharge   Problem: Respiratory: Goal: Verbalizations of increased ease of respirations will increase Outcome: Adequate for Discharge   Problem: Role Relationship: Goal: Family's ability to cope with current situation will improve Outcome: Adequate for Discharge Goal: Ability to verbalize concerns, feelings, and thoughts to partner or family member will improve Outcome: Adequate for Discharge   Problem: Pain Management: Goal: Satisfaction with pain management regimen will improve Outcome: Adequate for Discharge

## 2023-03-11 NOTE — Progress Notes (Signed)
Daily Progress Note   Patient Name: Brett Pearson.       Date: 03/11/2023 DOB: 12/31/1940  Age: 82 y.o. MRN#: [id number] Attending Physician: Darlin Drop, DO Primary Care Physician: [name], Elige Radon, MD Admit Date: 03/29/2023 Length of Stay: 2 days  Reason for Consultation/Follow-up: Establishing goals of care and symptom management  Subjective:   CC: Patient with increased work of breathing noted. Following up regarding complex medical decisions making and symptom management.   Subjective:  Reviewed EMR prior to presenting to bedside. At time of EMR review, patient has receive IV dilaudid 1mg  bolus x 10 doses. Continuous dilaudid infusion dose increased to 2mg /hr.  Based on bolus needs and vitals, discussed with RN and increased continuous range to 2-4mg /hr and increased bolus.   Discussed care with RN and examined patient. Patient having increased work of breathing as primary symptom. Patient noted to have O2 saturation ranging at times from 50s-80s. Discussed increasing to allow a larger range of continuous infusion and bolus. Goals is patient's comfort at the end of life. Patient supposed to be transferred to a floor room to continue comfort focused care. In hospital death anticipated.   Objective:   Vital Signs:  BP (!) 78/55 (BP Location: Left Arm)   Pulse 97   Temp (!) 97.4 F (36.3 C) (Axillary)   Resp 11   Ht 6\' 2"  (1.88 m)   Wt 86 kg   SpO2 (!) 88%   BMI 24.34 kg/m   Physical Exam: General: unresponsive, pale, chronically ill-appearing, cachectic, frail Eyes: No drainage noted  HENT: Dry mucous membranes Cardiovascular: RRR Respiratory: Increased increased work of breathing noted Abdomen: not distended Neuro: Unresponsive  Imaging:  I personally reviewed recent imaging.   Assessment & Plan:   Assessment: Patient is an 82 year old male with a past medical history of dementia receiving care in long-term facility, A-fib on Eliquis, cirrhosis, and right  BKA who was admitted on 03/30/2023 for management of decreased responsiveness and poor p.o. intake.  Upon admission, imaging showed fecal impaction and right hydronephrosis.  Patient underwent cystoscopy with right ureteral stent placement.  Patient's medical status continued to deteriorate in setting of sepsis secondary to urinary tract infection.  Patient's family elected to transition to comfort focused care on 03/10/2023.  Palliative medicine team consulted to assist with complex medical decision making and symptom management.   Recommendations/Plan: # Complex medical decision making/goals of care:                -Patient unable to participate in medical decision making secondary to his mental status.                -Patient was transitioned to comfort focused care on 03/28/2023. Discussed anticipation of hospital death based on examination. Patient not stable for transfer to inpatient hospice at time of evaluation.                 -Reviewed ACP documentation on file.                 -  Code Status: DNR  Prognosis: Hours - Days    # Symptom management                  -Pain/Dyspnea, acute in the setting of end-of-life care                               -Increase IV Dilaudid continuous  infusion to 2-8 mg/h.  Increase IV Dilaudid 4 mg bolus via infusion every 15 minutes as needed.  Continue to adjust based on patient's symptom burden.                   -Secretions, in the setting of end-of-life care                               -Continue IV glycopyrrolate 0.2 mg every 4 hours as needed.  # Psychosocial Support:  - significant other  # Discharge Planning: Anticipated Hospital Death   Thank you for allowing the palliative care team to participate in the care Marguerita Beards.Alvester Morin, DO Palliative Care Provider PMT # [phone]  If patient remains symptomatic despite maximum doses, please call PMT at [phone] between 0700 and 1900. Outside of these hours, please call attending, as  PMT does not have night coverage.  *Please note that this is a verbal dictation therefore any spelling or grammatical errors are due to the "[employer]" system interpretation.

## 2023-03-11 NOTE — Plan of Care (Signed)
Family has elected to transition to comfort measures. Urology will sign off. Please feel free to reach out to [place] if there is a change in plan or palliative need.

## 2023-03-12 DIAGNOSIS — A419 Sepsis, unspecified organism: Secondary | ICD-10-CM | POA: Diagnosis not present

## 2023-03-12 DIAGNOSIS — Z515 Encounter for palliative care: Secondary | ICD-10-CM | POA: Diagnosis not present

## 2023-03-12 DIAGNOSIS — R6521 Severe sepsis with septic shock: Secondary | ICD-10-CM | POA: Diagnosis not present

## 2023-03-12 MED ORDER — SCOPOLAMINE 1 MG/3DAYS TD PT72
1.0000 | MEDICATED_PATCH | TRANSDERMAL | Status: DC
  Administered 2023-03-12: 1.5 mg via TRANSDERMAL
  Filled 2023-03-12: qty 1

## 2023-04-07 NOTE — Progress Notes (Signed)
30ml dilaudid wasted with Barkley Bruns.

## 2023-04-07 NOTE — Progress Notes (Signed)
Daily Progress Note   Patient Name: Emer Kan.       Date: 03/31/2023 DOB: [birth year]  Age: 82 y.o. MRN#: [id number] Attending Physician: Darlin Drop, DO Primary Care Physician: [name], Elige Radon, MD Admit Date: 03/30/2023 Length of Stay: 3 days  Reason for Consultation/Follow-up: Establishing goals of care and symptom management  Subjective:   CC: Patient with rapid shallow breathing. Following up regarding complex medical decisions making and symptom management.   Subjective:  Reviewed EMR prior to presenting to bedside. Medication history noted  Continuous dilaudid infusion IV, also discussed with grand daughter holding vigil at bedside.   Continue comfort focused care. In hospital death anticipated.   Objective:   Vital Signs:  BP (!) 80/52 (BP Location: Left Arm)   Pulse 98   Temp (!) 101.3 F (38.5 C)   Resp 20   Ht 6\' 2"  (1.88 m)   Wt 86 kg   SpO2 (!) 86%   BMI 24.34 kg/m   Physical Exam: General: unresponsive, pale, chronically ill-appearing, cachectic, frail Eyes: No drainage noted  HENT: Dry mucous membranes Cardiovascular: RRR Respiratory: Increased increased work of breathing noted Abdomen: not distended Neuro: Unresponsive  Imaging:  I personally reviewed recent imaging.   Assessment & Plan:   Assessment: Patient is an 81 year old male with a past medical history of dementia receiving care in long-term facility, A-fib on Eliquis, cirrhosis, and right BKA who was admitted on 03/28/2023 for management of decreased responsiveness and poor p.o. intake.  Upon admission, imaging showed fecal impaction and right hydronephrosis.  Patient underwent cystoscopy with right ureteral stent placement.  Patient's medical status continued to deteriorate in setting of sepsis secondary to urinary tract infection.  Patient's family elected to transition to comfort focused care on 03/21/2023.  Palliative medicine team consulted to assist with complex medical decision  making and symptom management.   Recommendations/Plan: # Complex medical decision making/goals of care:                -Patient unable to participate in medical decision making secondary to his mental status.                -Patient was transitioned to comfort focused care on 03/11/2023. Discussed anticipation of hospital death based on examination. Patient not stable for transfer to inpatient hospice at time of evaluation.                 -Reviewed ACP documentation on file.                 -  Code Status: DNR  Prognosis: Hours - Days    # Symptom management                  -Pain/Dyspnea, acute in the setting of end-of-life care                               - IV Dilaudid continuous infusion as well as bolus via infusion every 15 minutes as needed.  Continue to adjust based on patient's symptom burden.                   -Secretions, in the setting of end-of-life care                               -Continue IV glycopyrrolate 0.2 mg every 4 hours as  needed. Add Scopolamine patch  # Psychosocial Support:  - significant other  # Discharge Planning: Anticipated Hospital Death   Thank you for allowing the palliative care team to participate in the care Marguerita Beards.Nickie Retort MDM Rosalin Hawking MD.  Palliative Care Provider PMT # [phone]  If patient remains symptomatic despite maximum doses, please call PMT at [phone] between 0700 and 1900. Outside of these hours, please call attending, as PMT does not have night coverage.  *Please note that this is a verbal dictation therefore any spelling or grammatical errors are due to the "[employer]agon [employer] One" system interpretation.

## 2023-04-07 NOTE — Progress Notes (Signed)
    OVERNIGHT PROGRESS REPORT  Notified by RN that patient is deceased as of 04/03/2044 Hrs.  Patient was DNR followed by Palliative  2 RN verified.  Family was notified by RN.     Chinita Greenland MSNA ACNPC-AG Acute Care Nurse Practitioner [facility]

## 2023-04-07 NOTE — Progress Notes (Cosign Needed)
   Dilaudid drip bag changed. There was 0ml to be wasted. Cosign sent to witnessing RN.

## 2023-04-07 NOTE — Care Management Important Message (Signed)
Important Message  Patient Details NO IM Letter given due to Comfort Care Name: Brett Pearson. MRN: [id number] Date of Birth: 07/24/1941   Medicare Important Message Given:  No     Caren Macadam 04/01/2023, 11:11 AM

## 2023-04-07 NOTE — Progress Notes (Signed)
PROGRESS NOTE  Brett Pearson. NUU:[id number] DOB: 11/18/1940 DOA: 04/02/2023 PCP: [name], Elige Radon, MD  HPI/Recap of past 24 hours:  Brett Pearson is an 82 y.o. M with dementia, lives in SNF, AF on Eliquis, R BKA and compensated cirrhosis who presented from SNF for 1 week decreased responsiveness, poor PO intake   Significant events: 8/2: Admitted on antibiotics; CT showed fecal impaction/obstruction and obstructing right hydronephrosis 8/3: Taken to OR for right ureteral stent, transferred to stepdown after     Significant studies: 8/2: CT abdomen and pelvis with contrast -- fecal impaction and large bowel obstruction; obstructing right hydronephrosis; pericardial changes; NSIP of bilateral lungs     Significant microbiology data: 8/2: Urine culture -- pending     Procedures: 8/3: Cystoscopy and right ureteral stent placement by Dr. Margo Aye     Consults: Urology   Family made decision for comfort care only on 04/05/2023.  Currently on comfort measures orders only.    03/15/2023: Comfort measures in place.  The patient was seen and examined at his bedside.  His significant other was present in the room.  The patient is unresponsive.  Shallow breath noted on exam.  Anticipated hospital death.    Assessment/Plan: Principal Problem:   Sepsis due to UTI due to impacted ureteral stone Active Problems:   Chronic atrial fibrillation (HCC)   Alzheimer's dementia (HCC)   Cryptogenic cirrhosis (HCC)   Hx of right BKA (HCC)   Interstitial lung disease (HCC)   Fecal impaction and large bowel obstruction   Ureterolithiasis   Palliative care encounter   DNR (do not resuscitate)   End of life care   High risk medication use   Dyspnea   Counseling and coordination of care   Goals of care, counseling/discussion   Need for emotional support  Sepsis secondary to UTI due to impacted ureteral stone Ureterolithiasis Fecal impaction and large bowel obstruction  Interstitial lung disease  (HCC)  Hx of right BKA (HCC)  Cryptogenic cirrhosis (HCC)  Alzheimer's dementia (HCC)  Chronic atrial fibrillation (HCC)  Continue comfort care measures.  Appreciate palliative care medicine's assistance.  Code Status: DNR/comfort care only.  Family Communication: Significant other and granddaughter at bedside.  Disposition Plan: Anticipated hospital death.   Consultants: Palliative care medicine Urology TOC.  Procedures: Cystoscopy and right ureteral stent placement on 03/31/2023  Antimicrobials: None.  DVT prophylaxis: Comfort care only.  Status is: Inpatient     Objective: Vitals:   03/11/23 1519 03/11/23 2014 03/22/2023 0400 04/01/2023 0903  BP: (!) 71/45 (!) 62/47 (!) 56/43 (!) 80/52  Pulse: (!) 110 (!) 102 96 98  Resp: 12   20  Temp: 97.7 F (36.5 C) 97.8 F (36.6 C) 99.2 F (37.3 C) (!) 101.3 F (38.5 C)  TempSrc: Oral     SpO2: (!) 80% (!) 71% (!) 89% (!) 86%  Weight:      Height:        Intake/Output Summary (Last 24 hours) at 03/13/2023 1540 Last data filed at 04/03/2023 1426 Gross per 24 hour  Intake 0 ml  Output --  Net 0 ml   Filed Weights   04/06/2023 1803  Weight: 86 kg    Exam:  General: 82 y.o. year-old male frail-appearing.  Unresponsive.   Cardiovascular: Regular rate and rhythm with no rubs or gallops.  Respiratory: Shallow breath present. Abdomen: Soft hypoactive bowel sounds.   Data Reviewed: CBC: Recent Labs  Lab 03/11/2023 1841 03/20/2023 1332  WBC 10.1 3.8*  NEUTROABS 6.7  --   HGB 11.8* 11.4*  HCT 36.0* 36.2*  MCV 103.2* 105.8*  PLT 248 181   Basic Metabolic Panel: Recent Labs  Lab 03/25/2023 1841 03/09/2023 1332  NA 138 142  K 3.8 3.4*  CL 102 108  CO2 30 21*  GLUCOSE 105* 78  BUN 19 17  CREATININE 0.85 0.90  CALCIUM 8.9 8.3*   GFR: Estimated Creatinine Clearance: 73.6 mL/min (by C-G formula based on SCr of 0.9 mg/dL). Liver Function Tests: Recent Labs  Lab 04/06/2023 1841 03/23/2023 1332  AST 18 33  ALT 11  12  ALKPHOS 62 116  BILITOT 0.6 1.5*  PROT 7.1 6.2*  ALBUMIN 3.0* 2.5*   Recent Labs  Lab 03/10/2023 1841  LIPASE 38   No results for input(s): "AMMONIA" in the last 168 hours. Coagulation Profile: Recent Labs  Lab 04/01/2023 0000 03/28/2023 0750  INR 1.5* 1.6*   Cardiac Enzymes: No results for input(s): "CKTOTAL", "CKMB", "CKMBINDEX", "TROPONINI" in the last 168 hours. BNP (last 3 results) No results for input(s): "PROBNP" in the last 8760 hours. HbA1C: No results for input(s): "HGBA1C" in the last 72 hours. CBG: No results for input(s): "GLUCAP" in the last 168 hours. Lipid Profile: No results for input(s): "CHOL", "HDL", "LDLCALC", "TRIG", "CHOLHDL", "LDLDIRECT" in the last 72 hours. Thyroid Function Tests: No results for input(s): "TSH", "T4TOTAL", "FREET4", "T3FREE", "THYROIDAB" in the last 72 hours. Anemia Panel: No results for input(s): "VITAMINB12", "FOLATE", "FERRITIN", "TIBC", "IRON", "RETICCTPCT" in the last 72 hours. Urine analysis:    Component Value Date/Time   COLORURINE AMBER (A) 03/14/2023 2208   APPEARANCEUR CLOUDY (A) 03/29/2023 2208   APPEARANCEUR Cloudy (A) 05/31/2021 1643   LABSPEC 1.021 03/26/2023 2208   PHURINE 5.0 03/27/2023 2208   GLUCOSEU NEGATIVE 03/17/2023 2208   HGBUR LARGE (A) 03/31/2023 2208   BILIRUBINUR NEGATIVE 03/14/2023 2208   BILIRUBINUR Negative 05/31/2021 1643   KETONESUR NEGATIVE 03/20/2023 2208   PROTEINUR 30 (A) 03/30/2023 2208   NITRITE POSITIVE (A) 03/13/2023 2208   LEUKOCYTESUR LARGE (A) 03/22/2023 2208   Sepsis Labs: @LABRCNTIP (procalcitonin:4,lacticidven:4)  ) Recent Results (from the past 240 hour(s))  Urine Culture     Status: Abnormal   Collection Time: 03/31/2023 10:08 PM   Specimen: Urine, Random  Result Value Ref Range Status   Specimen Description   Final    URINE, RANDOM Performed at [facility], [address], [place], [place] [address]    Special Requests   Final    NONE Reflexed  from [id number] Performed at [facility], [address], [place], [place] [address]    Culture >=100,000 COLONIES/mL ESCHERICHIA COLI (A)  Final   Report Status 03/11/2023 FINAL  Final   Organism ID, Bacteria ESCHERICHIA COLI (A)  Final      Susceptibility   Escherichia coli - MIC*    AMPICILLIN >=32 RESISTANT Resistant     CEFAZOLIN 16 SENSITIVE Sensitive     CEFEPIME <=0.12 SENSITIVE Sensitive     CEFTRIAXONE 0.5 SENSITIVE Sensitive     CIPROFLOXACIN <=0.25 SENSITIVE Sensitive     GENTAMICIN <=1 SENSITIVE Sensitive     IMIPENEM <=0.25 SENSITIVE Sensitive     NITROFURANTOIN <=16 SENSITIVE Sensitive     TRIMETH/SULFA <=20 SENSITIVE Sensitive     AMPICILLIN/SULBACTAM >=32 RESISTANT Resistant     PIP/TAZO 8 SENSITIVE Sensitive     * >=100,000 COLONIES/mL ESCHERICHIA COLI  MRSA Next Gen by PCR, Nasal     Status: None  Collection Time: 03/14/2023  4:07 PM   Specimen: Nasal Mucosa; Nasal Swab  Result Value Ref Range Status   MRSA by PCR Next Gen NOT DETECTED NOT DETECTED Final    Comment: (NOTE) The GeneXpert MRSA Assay (FDA approved for NASAL specimens only), is one component of a comprehensive MRSA colonization surveillance program. It is not intended to diagnose MRSA infection nor to guide or monitor treatment for MRSA infections. Test performance is not FDA approved in patients less than 65 years old. Performed at [facility], [address], [place], [place] [address]       Studies: No results found.  Scheduled Meds:  scopolamine  1 patch Transdermal Q72H   sodium chloride flush  3 mL Intravenous Q12H    Continuous Infusions:  sodium chloride Stopped (03/11/23 1218)   HYDROmorphone 8 mg/hr (03/26/2023 1128)     LOS: 3 days     Darlin Drop, MD [facility] Pager [phone]  If 7PM-7AM, please contact night-coverage [email or web address] Password [facility] 03/10/2023, 3:40 PM

## 2023-04-07 NOTE — Discharge Summary (Addendum)
Death Summary  Brett Pearson. DGL:[id number] DOB: Apr 10, 1941 DOA: 18-Mar-2023  PCP: [name], Elige Radon, MD  Admit date: 18-Mar-2023 Date of Death: Mar 22, 2023 Time of Death: 20:45 PM Notification: [name], Elige Radon, MD notified of death of 03/24/2023   History of present illness:   Brett Pearson is an 82 y.o. M with dementia, lives in SNF, AF on Eliquis, R BKA and compensated cirrhosis who presented from SNF for 1 week decreased responsiveness, poor PO intake    Significant events: 8/2: Admitted on antibiotics; CT showed fecal impaction/obstruction and obstructing right hydronephrosis 8/3: Taken to OR for right ureteral stent, transferred to stepdown after     Significant studies: 8/2: CT abdomen and pelvis with contrast -- fecal impaction and large bowel obstruction; obstructing right hydronephrosis; pericardial changes; NSIP of bilateral lungs     Significant microbiology data: 8/2: Urine culture -- pending     Procedures: 03/19/23: Cystoscopy and right ureteral stent placement by Dr. Margo Aye     Consults: Urology    Family made decision for comfort care only on 03/19/2023.      The patient deceased on March 22, 2023 at 20:45 PM at [facility].    Final Diagnoses:  Cardiopulmonary arrest.    Sepsis secondary to UTI due to impacted ureteral stone Ureterolithiasis Fecal impaction and large bowel obstruction  Interstitial lung disease (HCC)  Hx of right BKA (HCC)  Cryptogenic cirrhosis (HCC)  Alzheimer's dementia (HCC)  Chronic atrial fibrillation (HCC)   The results of significant diagnostics from this hospitalization (including imaging, microbiology, ancillary and laboratory) are listed below for reference.    Significant Diagnostic Studies: DG Abd 1 View  Result Date: March 19, 2023 CLINICAL DATA:  Abdominal distension, fever EXAM: ABDOMEN - 1 VIEW COMPARISON:  CT done on 18-Mar-2023 FINDINGS: There is moderate to marked gaseous distention of bowel loops in upper abdomen,  possibly suggesting ileus or distal colonic obstruction. Fecal impaction in the rectum seen in previous CT is difficult to visualize. Radiograph of pelvis is technically less than optimal obtained in oblique position limiting evaluation of rectum. There is a catheter in the pelvis which may be in the urinary bladder or rectum. There is interval placement of right nephroureteral stent. Right ureteral calculus seen in the previous CT could not be distinctly identified. This may suggest interval passage or obscuration by adjacent to bony structures. Marked degenerative changes are noted in lumbar spine. IMPRESSION: There is gaseous distention of bowel loops in upper abdomen suggesting ileus or distal colonic obstruction. Interval placement of right nephroureteral stent. Electronically Signed   By: Ernie Avena M.D.   On: 03/19/2023 16:46   DG C-Arm 1-60 Min-No Report  Result Date: 2023/03/19 Fluoroscopy was utilized by the requesting physician.  No radiographic interpretation.   CT ABDOMEN PELVIS W CONTRAST  Result Date: 03/18/23 CLINICAL DATA:  Acute nonlocalized abdominal pain, bowel obstruction EXAM: CT ABDOMEN AND PELVIS WITH CONTRAST TECHNIQUE: Multidetector CT imaging of the abdomen and pelvis was performed using the standard protocol following bolus administration of intravenous contrast. RADIATION DOSE REDUCTION: This exam was performed according to the departmental dose-optimization program which includes automated exposure control, adjustment of the mA and/or kV according to patient size and/or use of iterative reconstruction technique. CONTRAST:  OMNIPAQUE IOHEXOL 300 MG/ML  SOLN COMPARISON:  None Available. 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 This is not fully assessed on this examination. No  pleural effusion. Cardiomegaly again noted with biatrial enlargement. Calcification of the aortic valve leaflets. 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 Hepatobiliary: No focal liver abnormality is seen. No gallstones, gallbladder wall thickening, or biliary dilatation. Pancreas: Unremarkable. No pancreatic ductal dilatation or surrounding inflammatory changes. Spleen: Normal in size without focal abnormality. Adrenals/Urinary Tract: The adrenal glands are unremarkable. The kidneys are normal in size and position. Mild right hydronephrosis is present with an obstructing 5 mm calculus seen within the mid right ureter at the pelvic inlet (axial image # 77/11). Superimposed 4 mm nonobstructing calculus within the interpolar right kidney. No nephro or urolithiasis on the left. No hydronephrosis on the left. Simple cortical cyst noted within the lower pole the right kidney for which no follow-up imaging is recommended. The bladder is unremarkable. Stomach/Bowel: Large volume stool is seen within the rectal vault suggesting changes of fecal impaction there is progressive gaseous distension of the more proximal mid and distal colon which may reflect changes of a superimposed colonic ileus or resultant large bowel obstruction. The sigmoid colon is redundant and measures up to 10 cm in diameter. No evidence of volvulus, however. No superimposed inflammatory change. No free intraperitoneal gas or fluid. No pneumatosis. Stomach, small bowel, and appendix are normal. Vascular/Lymphatic: Aortic atherosclerosis. No enlarged abdominal or pelvic lymph nodes. Reproductive: Prostate is unremarkable. Other: No abdominal wall hernia Musculoskeletal: Status post right total hip arthroplasty. Osseous structures are diffusely osteopenic. Diffuse advanced degenerative changes are seen throughout the visualized  thoracolumbar spine with probable degenerative ankylosis of L1-L3 vertebral bodies. No acute bone abnormality. IMPRESSION: 1. Obstructing 5 mm calculus within the mid right ureter at the pelvic inlet with associated mild right hydronephrosis. Superimposed 4 mm nonobstructing calculus within the interpolar right kidney. 2. Large volume stool within the rectal vault suggesting changes of fecal impaction. Progressive gaseous distension of the more proximal mid and distal colon which may reflect changes of a superimposed colonic ileus or resultant large bowel obstruction. No evidence of volvulus. No superimposed inflammatory change. 3. 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 This is not fully assessed on this examination. If indicated, this could be better assessed dedicated nonemergent high-resolution CT examination of the chest. 4. Cardiomegaly with biatrial enlargement. 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 If indicated, this would be better assessed with dedicated echocardiography 5. Aortic atherosclerosis. Aortic Atherosclerosis (ICD10-I70.0). Electronically Signed   By: Helyn Numbers M.D.   On: 04/02/2023 20:25    Microbiology: Recent Results (from the past 240 hour(s))  Urine Culture     Status: Abnormal   Collection Time: 03/19/2023 10:08 PM   Specimen: Urine, Random  Result Value Ref Range Status   Specimen Description   Final    URINE, RANDOM Performed at [facility], [address] [place], [place] [address]    Special Requests   Final    NONE Reflexed from [id number] Performed at [facility], [address] [place], [place] [address]    Culture >=100,000 COLONIES/mL ESCHERICHIA COLI  (A)  Final   Report Status 03/11/2023 FINAL  Final   Organism ID, Bacteria ESCHERICHIA COLI (A)  Final      Susceptibility   Escherichia coli - MIC*    AMPICILLIN >=32 RESISTANT Resistant     CEFAZOLIN  16 SENSITIVE Sensitive     CEFEPIME <=0.12 SENSITIVE Sensitive     CEFTRIAXONE 0.5 SENSITIVE Sensitive     CIPROFLOXACIN <=0.25 SENSITIVE Sensitive     GENTAMICIN <=1 SENSITIVE Sensitive     IMIPENEM <=0.25 SENSITIVE Sensitive     NITROFURANTOIN <=16 SENSITIVE Sensitive     TRIMETH/SULFA <=20 SENSITIVE Sensitive     AMPICILLIN/SULBACTAM >=32 RESISTANT Resistant     PIP/TAZO 8 SENSITIVE Sensitive     * >=100,000 COLONIES/mL ESCHERICHIA COLI  MRSA Next Gen by PCR, Nasal     Status: None   Collection Time: 03/20/2023  4:07 PM   Specimen: Nasal Mucosa; Nasal Swab  Result Value Ref Range Status   MRSA by PCR Next Gen NOT DETECTED NOT DETECTED Final    Comment: (NOTE) The GeneXpert MRSA Assay (FDA approved for NASAL specimens only), is one component of a comprehensive MRSA colonization surveillance program. It is not intended to diagnose MRSA infection nor to guide or monitor treatment for MRSA infections. Test performance is not FDA approved in patients less than 45 years old. Performed at [facility], [address], [place], [place] [address]      Labs: Basic Metabolic Panel: Recent Labs  Lab 03/23/2023 1841 03/18/2023 1332  NA 138 142  K 3.8 3.4*  CL 102 108  CO2 30 21*  GLUCOSE 105* 78  BUN 19 17  CREATININE 0.85 0.90  CALCIUM 8.9 8.3*   Liver Function Tests: Recent Labs  Lab 03/11/2023 1841 04/01/2023 1332  AST 18 33  ALT 11 12  ALKPHOS 62 116  BILITOT 0.6 1.5*  PROT 7.1 6.2*  ALBUMIN 3.0* 2.5*   Recent Labs  Lab 03/11/2023 1841  LIPASE 38   No results for input(s): "AMMONIA" in the last 168 hours. CBC: Recent Labs  Lab 03/25/2023 1841 03/24/2023 1332  WBC 10.1 3.8*  NEUTROABS 6.7  --   HGB 11.8* 11.4*  HCT 36.0* 36.2*  MCV 103.2* 105.8*   PLT 248 181   Cardiac Enzymes: No results for input(s): "CKTOTAL", "CKMB", "CKMBINDEX", "TROPONINI" in the last 168 hours. D-Dimer No results for input(s): "DDIMER" in the last 72 hours. BNP: Invalid input(s): "POCBNP" CBG: No results for input(s): "GLUCAP" in the last 168 hours. Anemia work up No results for input(s): "VITAMINB12", "FOLATE", "FERRITIN", "TIBC", "IRON", "RETICCTPCT" in the last 72 hours. Urinalysis    Component Value Date/Time   COLORURINE AMBER (A) 03/21/2023 2208   APPEARANCEUR CLOUDY (A) 03/21/2023 2208   APPEARANCEUR Cloudy (A) 05/31/2021 1643   LABSPEC 1.021 03/29/2023 2208   PHURINE 5.0 03/15/2023 2208   GLUCOSEU NEGATIVE 03/31/2023 2208   HGBUR LARGE (A) 04/04/2023 2208   BILIRUBINUR NEGATIVE 04/02/2023 2208   BILIRUBINUR Negative 05/31/2021 1643   KETONESUR NEGATIVE 03/24/2023 2208   PROTEINUR 30 (A) 03/24/2023 2208   NITRITE POSITIVE (A) 03/31/2023 2208   LEUKOCYTESUR LARGE (A) 03/27/2023 2208   Sepsis Labs Recent Labs  Lab 03/18/2023 1841 03/20/2023 1332  WBC 10.1 3.8*       SIGNED:  Darlin Drop, MD  [facility] 03/14/2023, 11:45 AM Pager   If 7PM-7AM, please contact night-coverage [email or web address] Password [email or web address]

## 2023-04-07 NOTE — Plan of Care (Signed)
  Problem: Role Relationship: Goal: Family's ability to cope with current situation will improve Outcome: Progressing   Problem: Pain Management: Goal: Satisfaction with pain management regimen will improve Outcome: Progressing   

## 2023-04-07 DEATH — deceased

## 2023-08-04 IMAGING — DX DG CHEST 2V
3 series · 3 of 3 positions shown · non-contrast
Comparison: Previous studies including the examination of
07/31/2018

CLINICAL DATA: Altered mental status

EXAM:
CHEST - 2 VIEW

[chest ap]
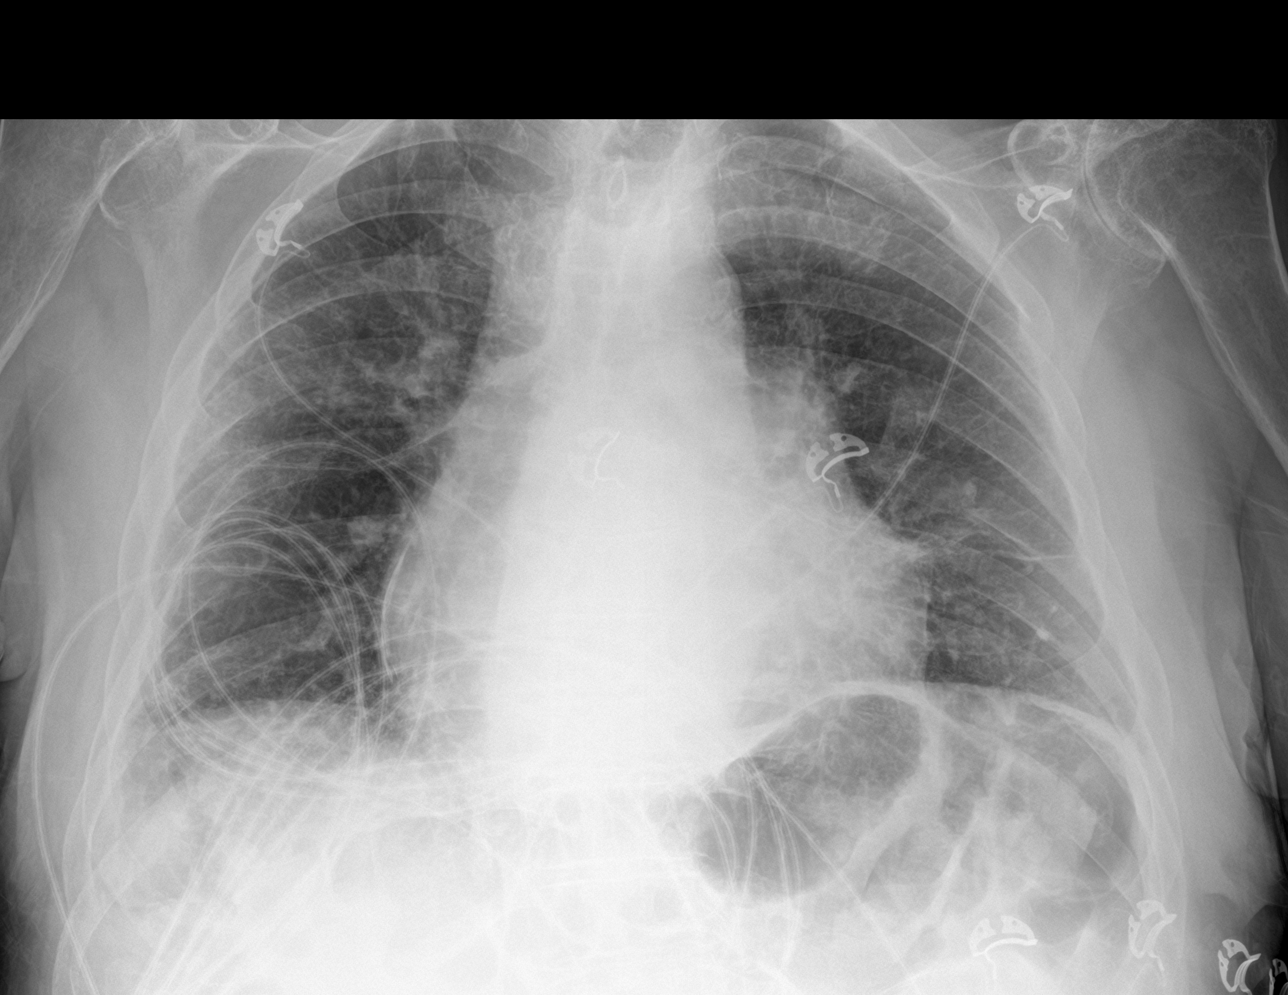

[chest lat (1 of 2)]
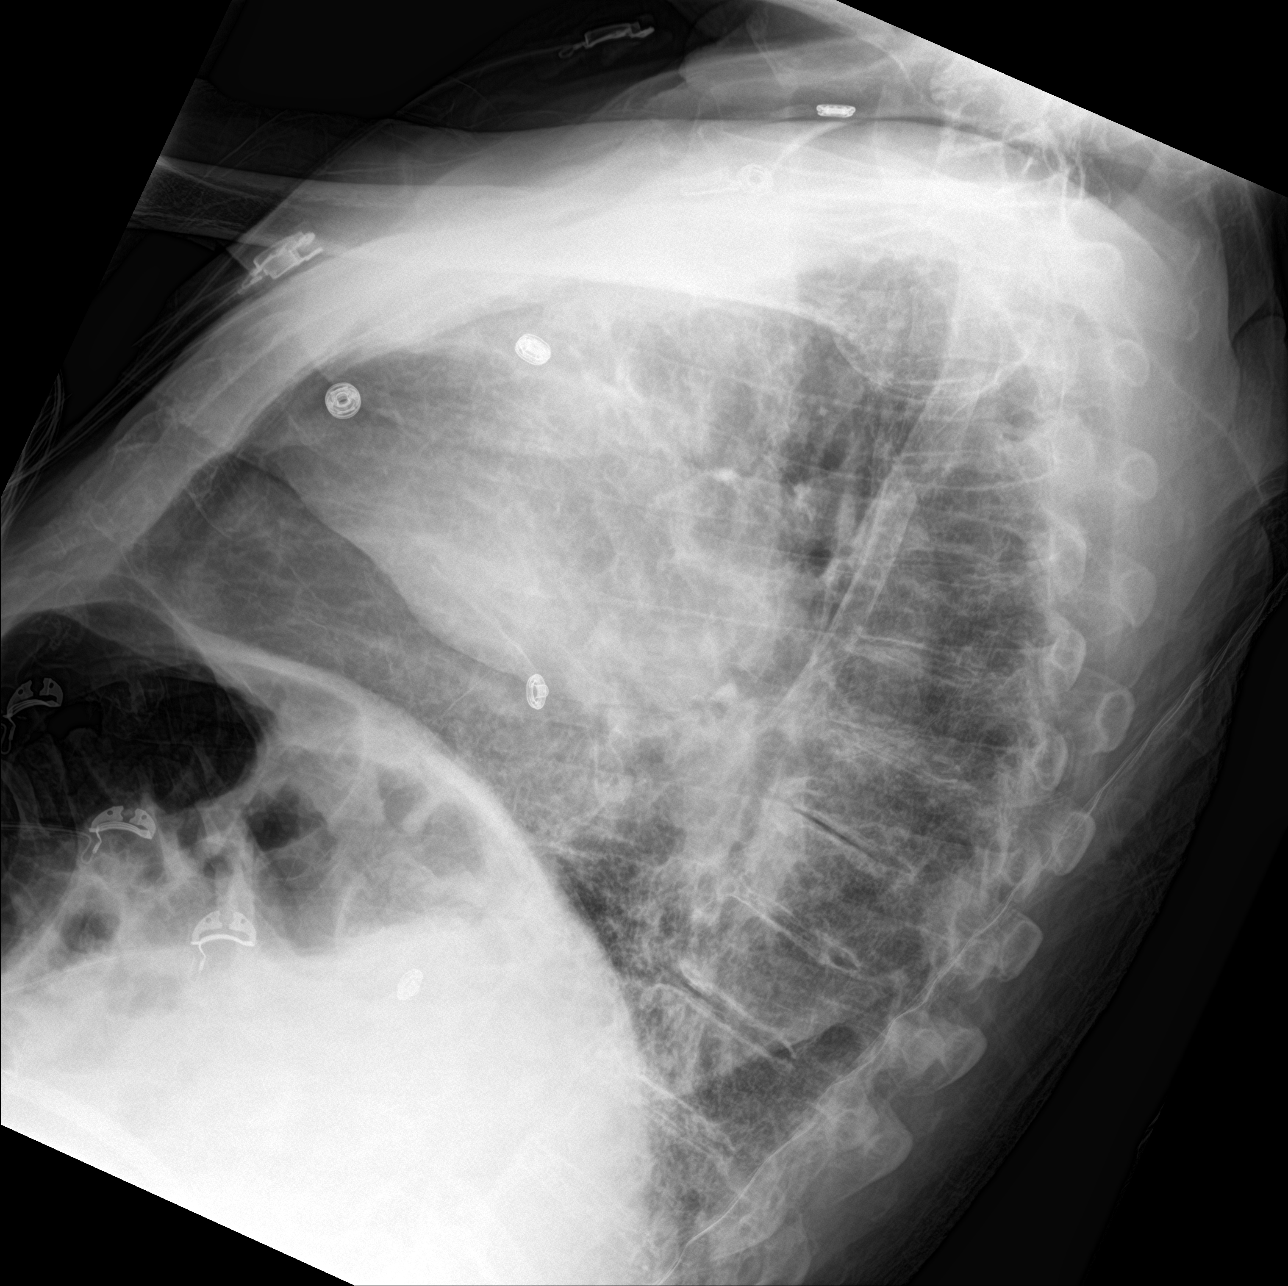

[chest lat (2 of 2)]
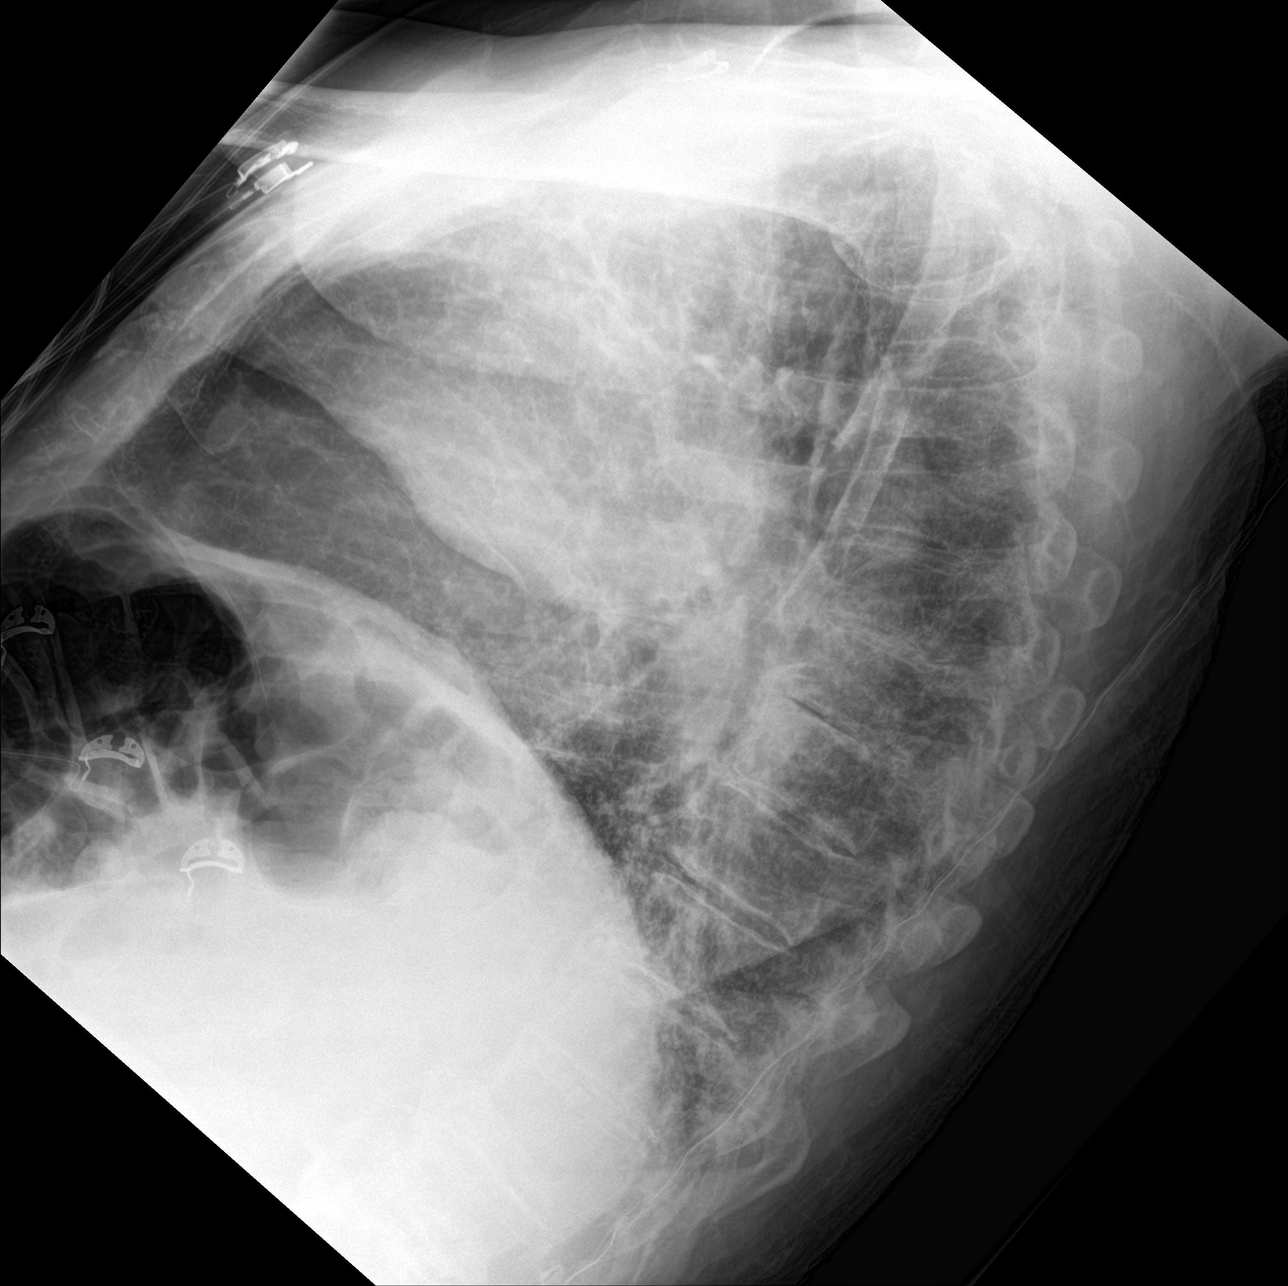

[3 of 3 positions shown; findings below may reference images not displayed]

FINDINGS: Transverse diameter of heart is increased. Apparent shift of
mediastinum to the right may be due to rotation. There are no signs
of alveolar pulmonary edema. Linear densities in the left lower lung
fields have not changed significantly, possibly scarring. There is
no focal pulmonary consolidation. There is no significant pleural
effusion or pneumothorax. There is a linear high density along the
right margin of heart, possibly pericardial calcification which has
not changed significantly. Colon appears to be interposed between
the liver and anterior abdominal wall in the right subdiaphragmatic
location.
IMPRESSION: Cardiomegaly. There are no signs of pulmonary edema or focal
pulmonary consolidation. Linear densities in the left lower lung
fields have not changed significantly, possibly suggesting scarring.

## 2023-08-04 IMAGING — CT CT HEAD W/O CM
3 series · 15 of 47 positions shown, 18 images · non-contrast
Comparison: None.

CLINICAL DATA: Mental status change.

EXAM:
CT HEAD WITHOUT CONTRAST
TECHNIQUE: Contiguous axial images were obtained from the base of the skull
through the vertex without intravenous contrast.

[Series 2: head w o · axial · 0.43mm/px · z∈[+692,+817]mm · 9 of 31 slices shown, 12 images]
[im 3/31  brain]
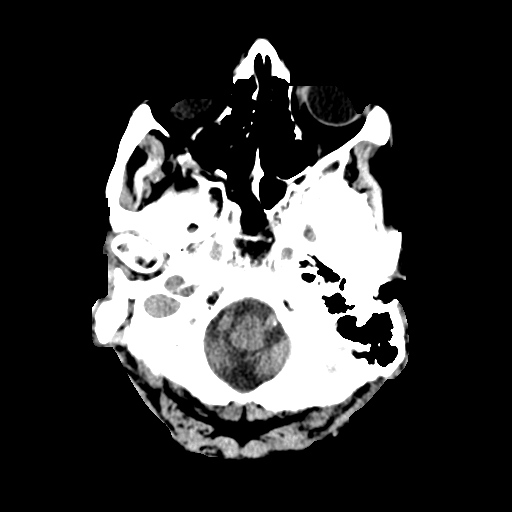
[im 3/31  bone]
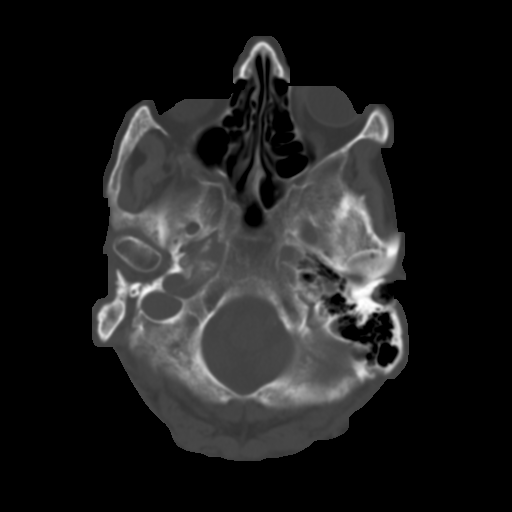
[im 6/31  brain]
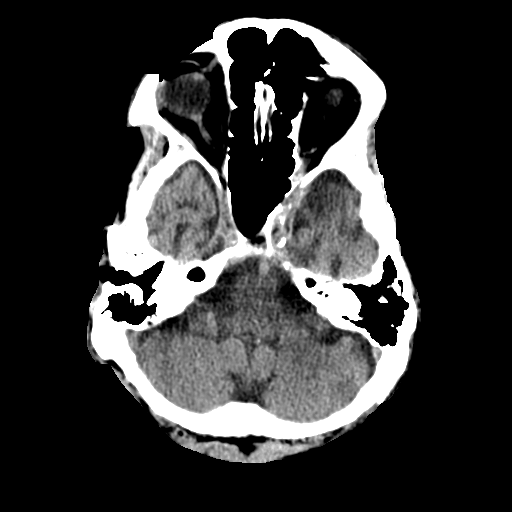
[im 9/31  brain]
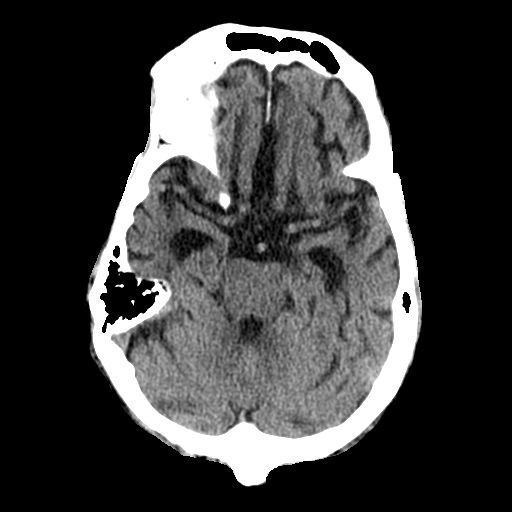
[im 12/31  brain]
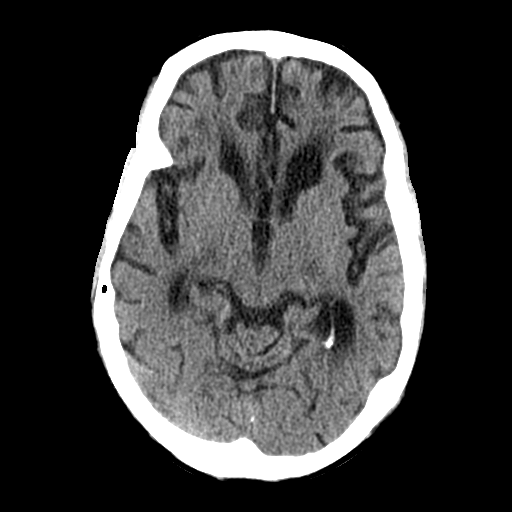
[im 16/31  brain]
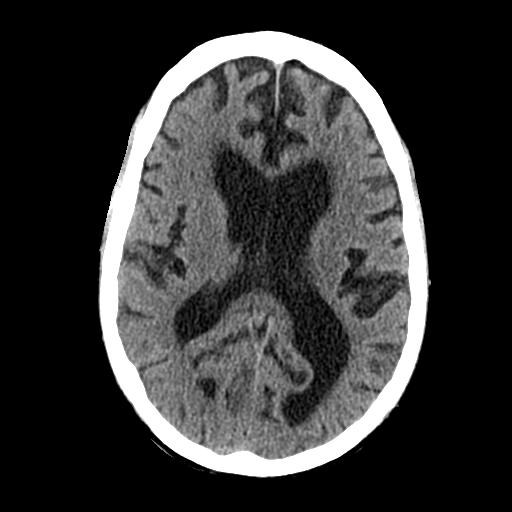
[im 16/31  bone]
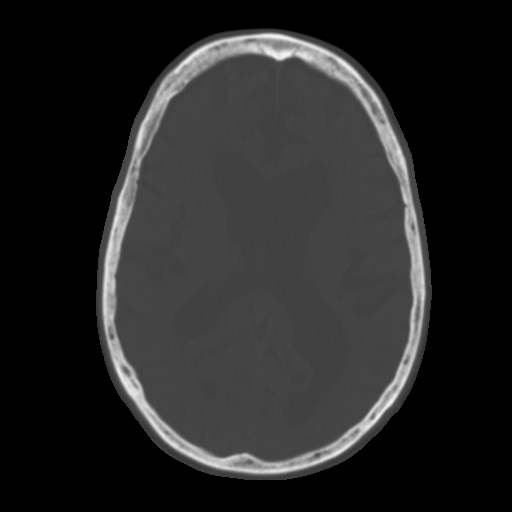
[im 19/31  brain]
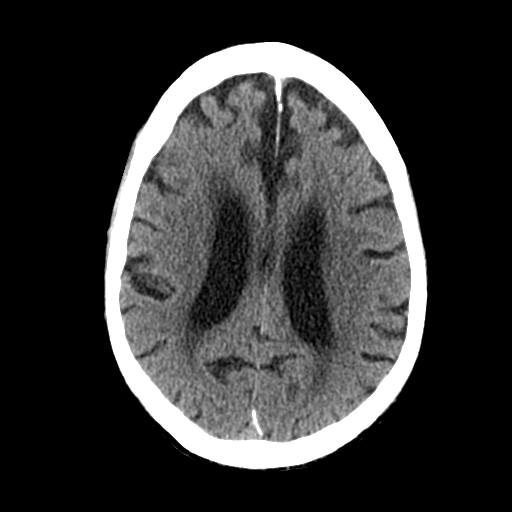
[im 22/31  brain]
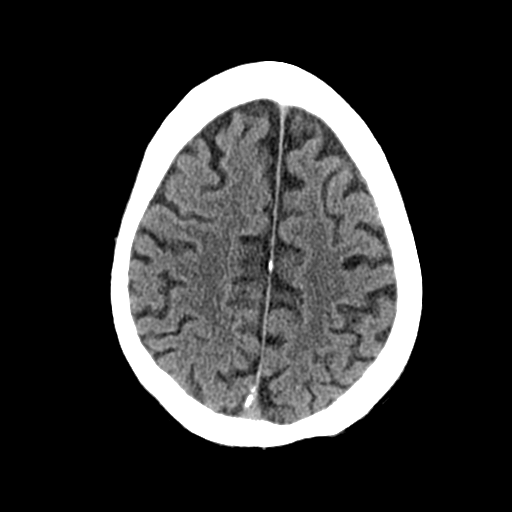
[im 25/31  brain]
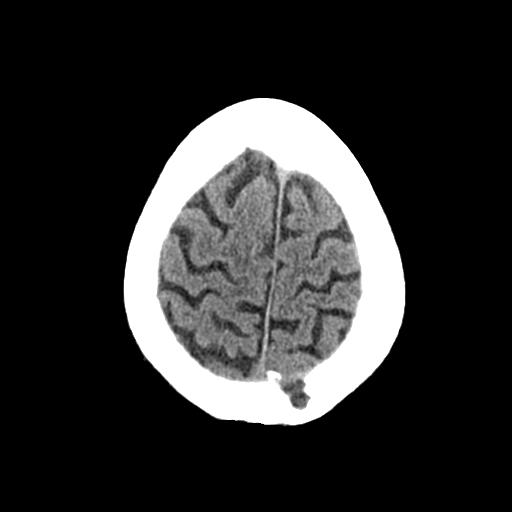
[im 28/31  brain]
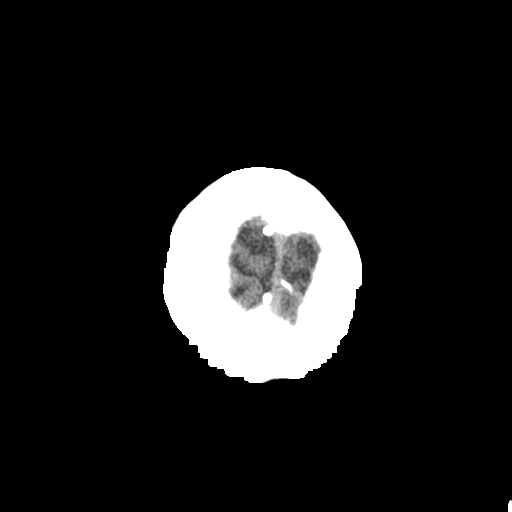
[im 28/31  bone]
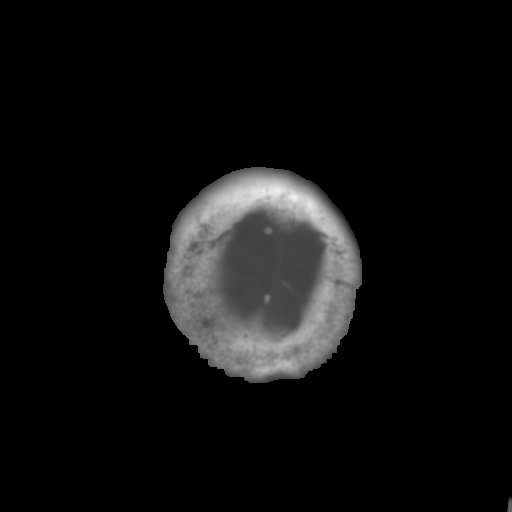

[Series 4: coronal soft · coronal · 0.35mm/px · 3 of 73 slices shown]
[im 25/73  brain]
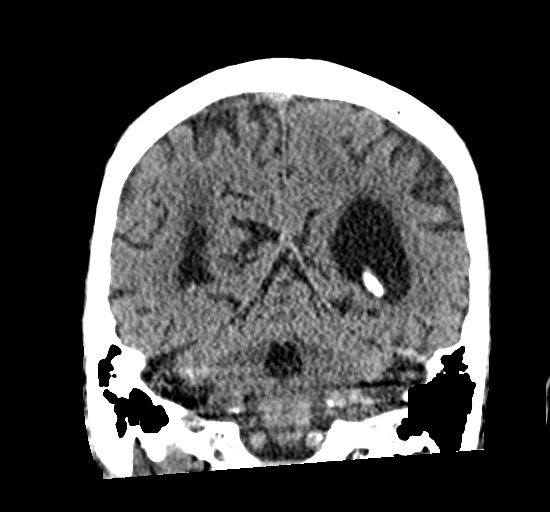
[im 33/73  brain]
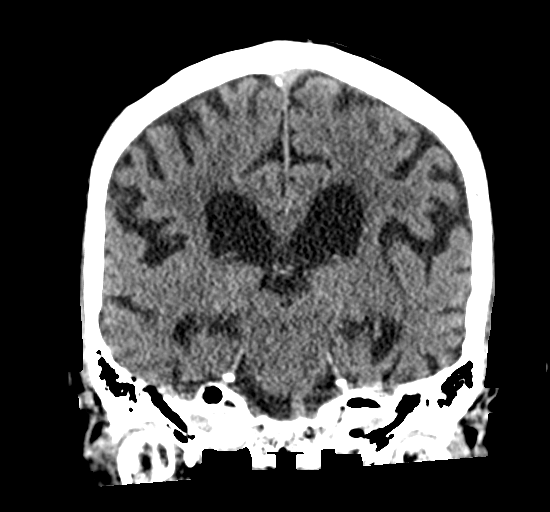
[im 41/73  brain]
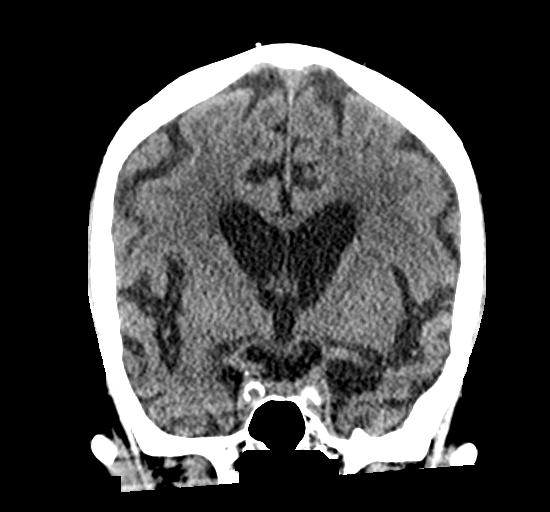

[Series 5: sagittal soft · sagittal · 0.35mm/px · 3 of 64 slices shown]
[im 22/64  brain]
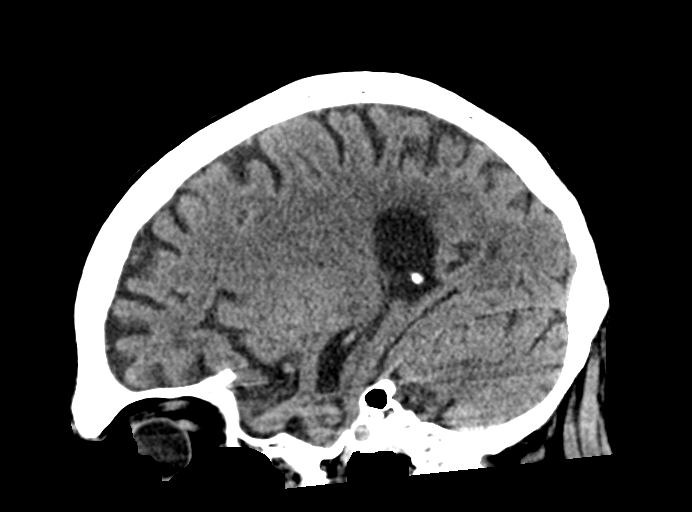
[im 32/64  brain]
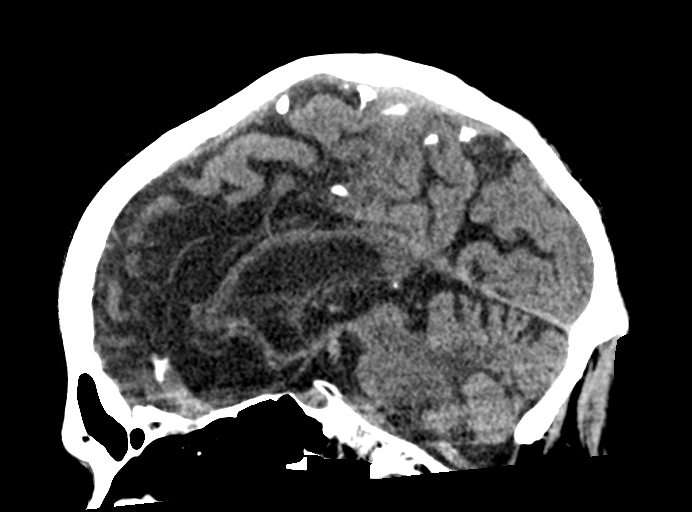
[im 43/64  brain]
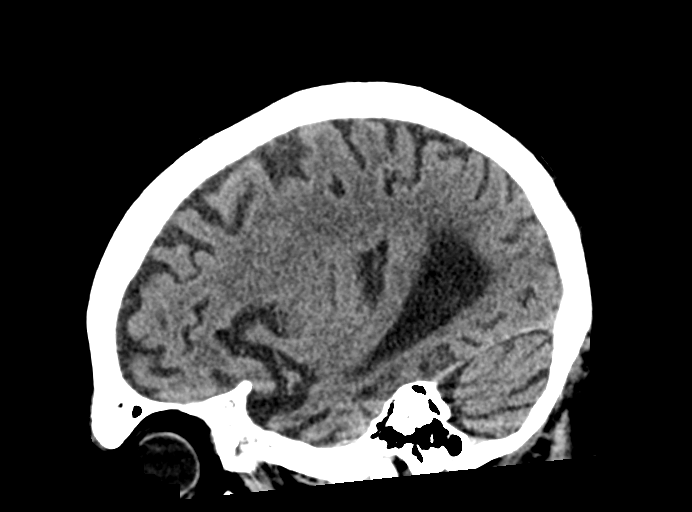

[15 of 47 positions shown; findings below may reference images not displayed]

BRAIN:
BRAIN
Cerebral ventricle sizes are concordant with the degree of cerebral
volume loss.

Patchy and confluent areas of decreased attenuation are noted
throughout the deep and periventricular white matter of the cerebral
hemispheres bilaterally, compatible with chronic microvascular
ischemic disease.

No evidence of large-territorial acute infarction. No parenchymal
hemorrhage. No mass lesion. No extra-axial collection.

No mass effect or midline shift. No hydrocephalus. Basilar cisterns
are patent.

Vascular: No hyperdense vessel. Atherosclerotic calcifications are
present within the cavernous internal carotid arteries.

Skull: No acute fracture or focal lesion.

Sinuses/Orbits: Paranasal sinuses and mastoid air cells are clear.
Likely bilateral lens replacement. Otherwise orbits are
unremarkable.

Other: None.
IMPRESSION: No acute intracranial abnormality.
